# Patient Record
Sex: Female | Born: 1935 | Race: White | Hispanic: No | Smoking: Former smoker
Health system: Southern US, Community
[De-identification: ages and names within clinical notes are randomized; demographics above are authoritative.]

## PROBLEM LIST (undated history)

## (undated) DIAGNOSIS — M25559 Pain in unspecified hip: Secondary | ICD-10-CM

## (undated) DIAGNOSIS — Z86010 Personal history of colonic polyps: Secondary | ICD-10-CM

## (undated) DIAGNOSIS — Z9089 Acquired absence of other organs: Secondary | ICD-10-CM

## (undated) DIAGNOSIS — E876 Hypokalemia: Secondary | ICD-10-CM

## (undated) DIAGNOSIS — J45909 Unspecified asthma, uncomplicated: Secondary | ICD-10-CM

## (undated) DIAGNOSIS — K5730 Diverticulosis of large intestine without perforation or abscess without bleeding: Secondary | ICD-10-CM

## (undated) DIAGNOSIS — Z9889 Other specified postprocedural states: Secondary | ICD-10-CM

## (undated) DIAGNOSIS — J449 Chronic obstructive pulmonary disease, unspecified: Secondary | ICD-10-CM

## (undated) DIAGNOSIS — IMO0002 Reserved for concepts with insufficient information to code with codable children: Secondary | ICD-10-CM

## (undated) DIAGNOSIS — J441 Chronic obstructive pulmonary disease with (acute) exacerbation: Secondary | ICD-10-CM

## (undated) DIAGNOSIS — Z9079 Acquired absence of other genital organ(s): Secondary | ICD-10-CM

## (undated) DIAGNOSIS — J309 Allergic rhinitis, unspecified: Secondary | ICD-10-CM

## (undated) DIAGNOSIS — I10 Essential (primary) hypertension: Secondary | ICD-10-CM

## (undated) DIAGNOSIS — E785 Hyperlipidemia, unspecified: Secondary | ICD-10-CM

## (undated) HISTORY — PX: APPENDECTOMY: SHX54

## (undated) HISTORY — DX: Hyperlipidemia, unspecified: E78.5

## (undated) HISTORY — DX: Personal history of colonic polyps: Z86.010

## (undated) HISTORY — DX: Hypokalemia: E87.6

## (undated) HISTORY — DX: Reserved for concepts with insufficient information to code with codable children: IMO0002

## (undated) HISTORY — PX: DILATION AND CURETTAGE OF UTERUS: SHX78

## (undated) HISTORY — DX: Diverticulosis of large intestine without perforation or abscess without bleeding: K57.30

## (undated) HISTORY — PX: LUMBAR LAMINECTOMY: SHX95

## (undated) HISTORY — PX: TONSILLECTOMY: SHX5217

## (undated) HISTORY — DX: Chronic obstructive pulmonary disease with (acute) exacerbation: J44.1

## (undated) HISTORY — PX: LIPOMA EXCISION: SHX5283

## (undated) HISTORY — DX: Acquired absence of other organs: Z90.89

## (undated) HISTORY — DX: Pain in unspecified hip: M25.559

## (undated) HISTORY — DX: Chronic obstructive pulmonary disease, unspecified: J44.9

## (undated) HISTORY — DX: Other specified postprocedural states: Z98.89

## (undated) HISTORY — DX: Essential (primary) hypertension: I10

## (undated) HISTORY — PX: ABDOMINAL HYSTERECTOMY: SHX81

## (undated) HISTORY — DX: Unspecified asthma, uncomplicated: J45.909

## (undated) HISTORY — DX: Allergic rhinitis, unspecified: J30.9

## (undated) HISTORY — DX: Acquired absence of other genital organ(s): Z90.79

---

## 2005-01-18 ENCOUNTER — Ambulatory Visit: Payer: Self-pay | Admitting: Internal Medicine

## 2005-01-25 ENCOUNTER — Ambulatory Visit: Payer: Self-pay | Admitting: Internal Medicine

## 2005-07-18 ENCOUNTER — Ambulatory Visit: Payer: Self-pay | Admitting: Internal Medicine

## 2006-03-23 ENCOUNTER — Ambulatory Visit: Payer: Self-pay | Admitting: Internal Medicine

## 2006-03-23 LAB — CONVERTED CEMR LAB
ALT: 21 units/L (ref 0–40)
Albumin: 4.1 g/dL (ref 3.5–5.2)
Alkaline Phosphatase: 70 units/L (ref 39–117)
Basophils Absolute: 0.1 10*3/uL (ref 0.0–0.1)
Basophils Relative: 1 % (ref 0.0–1.0)
Cholesterol: 194 mg/dL (ref 0–200)
GFR calc non Af Amer: 51 mL/min
Glomerular Filtration Rate, Af Am: 62 mL/min/{1.73_m2}
Glucose, Bld: 109 mg/dL — ABNORMAL HIGH (ref 70–99)
Monocytes Absolute: 0.7 10*3/uL (ref 0.2–0.7)
Platelets: 282 10*3/uL (ref 150–400)
Potassium: 2.8 meq/L — ABNORMAL LOW (ref 3.5–5.1)
RBC: 4.72 M/uL (ref 3.87–5.11)
Sodium: 142 meq/L (ref 135–145)
Specific Gravity, Urine: 1.02 (ref 1.000–1.03)
Total Protein, Urine: NEGATIVE mg/dL
Total Protein: 7.6 g/dL (ref 6.0–8.3)
VLDL: 87 mg/dL — ABNORMAL HIGH (ref 0–40)
WBC: 9.3 10*3/uL (ref 4.5–10.5)
pH: 6 (ref 5.0–8.0)

## 2006-03-23 LAB — HM MAMMOGRAPHY: HM Mammogram: NORMAL

## 2006-03-27 ENCOUNTER — Encounter: Admission: RE | Admit: 2006-03-27 | Discharge: 2006-03-27 | Payer: Self-pay | Admitting: Internal Medicine

## 2006-03-27 ENCOUNTER — Ambulatory Visit: Payer: Self-pay | Admitting: Internal Medicine

## 2006-06-12 ENCOUNTER — Inpatient Hospital Stay (HOSPITAL_COMMUNITY): Admission: RE | Admit: 2006-06-12 | Discharge: 2006-06-12 | Payer: Self-pay | Admitting: Neurosurgery

## 2006-08-12 ENCOUNTER — Emergency Department (HOSPITAL_COMMUNITY): Admission: EM | Admit: 2006-08-12 | Discharge: 2006-08-12 | Payer: Self-pay | Admitting: Family Medicine

## 2006-08-29 ENCOUNTER — Ambulatory Visit: Payer: Self-pay | Admitting: Internal Medicine

## 2006-09-12 ENCOUNTER — Ambulatory Visit: Payer: Self-pay | Admitting: Internal Medicine

## 2006-09-29 ENCOUNTER — Ambulatory Visit: Payer: Self-pay | Admitting: Internal Medicine

## 2007-03-30 ENCOUNTER — Encounter: Payer: Self-pay | Admitting: Endocrinology

## 2007-04-30 ENCOUNTER — Telehealth: Payer: Self-pay | Admitting: Internal Medicine

## 2007-05-29 ENCOUNTER — Encounter: Payer: Self-pay | Admitting: *Deleted

## 2007-05-29 DIAGNOSIS — J309 Allergic rhinitis, unspecified: Secondary | ICD-10-CM

## 2007-05-29 DIAGNOSIS — IMO0002 Reserved for concepts with insufficient information to code with codable children: Secondary | ICD-10-CM

## 2007-05-29 DIAGNOSIS — E785 Hyperlipidemia, unspecified: Secondary | ICD-10-CM

## 2007-05-29 DIAGNOSIS — J45909 Unspecified asthma, uncomplicated: Secondary | ICD-10-CM

## 2007-05-29 DIAGNOSIS — Z9889 Other specified postprocedural states: Secondary | ICD-10-CM

## 2007-05-29 DIAGNOSIS — Z9079 Acquired absence of other genital organ(s): Secondary | ICD-10-CM | POA: Insufficient documentation

## 2007-05-29 DIAGNOSIS — Z9089 Acquired absence of other organs: Secondary | ICD-10-CM

## 2007-05-29 DIAGNOSIS — I10 Essential (primary) hypertension: Secondary | ICD-10-CM | POA: Insufficient documentation

## 2007-05-29 HISTORY — DX: Other specified postprocedural states: Z98.89

## 2007-05-29 HISTORY — DX: Acquired absence of other genital organ(s): Z90.79

## 2007-05-29 HISTORY — DX: Unspecified asthma, uncomplicated: J45.909

## 2007-05-29 HISTORY — DX: Allergic rhinitis, unspecified: J30.9

## 2007-05-29 HISTORY — DX: Hyperlipidemia, unspecified: E78.5

## 2007-05-29 HISTORY — DX: Reserved for concepts with insufficient information to code with codable children: IMO0002

## 2007-05-29 HISTORY — DX: Essential (primary) hypertension: I10

## 2007-05-29 HISTORY — DX: Acquired absence of other organs: Z90.89

## 2007-05-30 ENCOUNTER — Ambulatory Visit: Payer: Self-pay | Admitting: Internal Medicine

## 2007-05-30 LAB — CONVERTED CEMR LAB
AST: 26 units/L (ref 0–37)
Alkaline Phosphatase: 79 units/L (ref 39–117)
BUN: 21 mg/dL (ref 6–23)
CO2: 33 meq/L — ABNORMAL HIGH (ref 19–32)
Chloride: 100 meq/L (ref 96–112)
Cholesterol: 208 mg/dL (ref 0–200)
Creatinine, Ser: 1 mg/dL (ref 0.4–1.2)
HDL: 39.2 mg/dL (ref >39.0)
Potassium: 2.8 meq/L — ABNORMAL LOW (ref 3.5–5.1)
Total Bilirubin: 1.3 mg/dL — ABNORMAL HIGH (ref 0.3–1.2)
Total Protein: 7.5 g/dL (ref 6.0–8.3)

## 2007-05-31 ENCOUNTER — Encounter: Payer: Self-pay | Admitting: Internal Medicine

## 2007-05-31 ENCOUNTER — Telehealth: Payer: Self-pay | Admitting: Internal Medicine

## 2007-06-05 ENCOUNTER — Ambulatory Visit: Payer: Self-pay | Admitting: Internal Medicine

## 2007-06-05 LAB — CONVERTED CEMR LAB
BUN: 23 mg/dL (ref 6–23)
CO2: 32 meq/L (ref 19–32)
GFR calc Af Amer: 62 mL/min
Glucose, Bld: 148 mg/dL — ABNORMAL HIGH (ref 70–99)
Potassium: 3.3 meq/L — ABNORMAL LOW (ref 3.5–5.1)
Sodium: 140 meq/L (ref 135–145)

## 2007-06-06 ENCOUNTER — Encounter: Payer: Self-pay | Admitting: Internal Medicine

## 2007-06-12 ENCOUNTER — Ambulatory Visit: Payer: Self-pay | Admitting: Internal Medicine

## 2007-06-12 DIAGNOSIS — E876 Hypokalemia: Secondary | ICD-10-CM

## 2007-06-12 HISTORY — DX: Hypokalemia: E87.6

## 2007-06-12 LAB — CONVERTED CEMR LAB
CO2: 32 meq/L (ref 19–32)
Calcium: 9.9 mg/dL (ref 8.4–10.5)
GFR calc Af Amer: 62 mL/min
GFR calc non Af Amer: 51 mL/min
Glucose, Bld: 148 mg/dL — ABNORMAL HIGH (ref 70–99)
Potassium: 3.3 meq/L — ABNORMAL LOW (ref 3.5–5.1)
Sodium: 140 meq/L (ref 135–145)

## 2007-06-13 ENCOUNTER — Encounter: Payer: Self-pay | Admitting: Internal Medicine

## 2007-11-21 ENCOUNTER — Ambulatory Visit: Payer: Self-pay | Admitting: Internal Medicine

## 2007-11-21 DIAGNOSIS — Z860100 Personal history of colon polyps, unspecified: Secondary | ICD-10-CM

## 2007-11-21 DIAGNOSIS — J449 Chronic obstructive pulmonary disease, unspecified: Secondary | ICD-10-CM

## 2007-11-21 DIAGNOSIS — J4489 Other specified chronic obstructive pulmonary disease: Secondary | ICD-10-CM

## 2007-11-21 DIAGNOSIS — K5730 Diverticulosis of large intestine without perforation or abscess without bleeding: Secondary | ICD-10-CM | POA: Insufficient documentation

## 2007-11-21 DIAGNOSIS — J441 Chronic obstructive pulmonary disease with (acute) exacerbation: Secondary | ICD-10-CM | POA: Insufficient documentation

## 2007-11-21 HISTORY — DX: Other specified chronic obstructive pulmonary disease: J44.89

## 2007-11-21 HISTORY — DX: Diverticulosis of large intestine without perforation or abscess without bleeding: K57.30

## 2007-11-21 HISTORY — DX: Personal history of colonic polyps: Z86.010

## 2007-11-21 HISTORY — DX: Chronic obstructive pulmonary disease, unspecified: J44.9

## 2007-11-21 HISTORY — DX: Chronic obstructive pulmonary disease with (acute) exacerbation: J44.1

## 2007-11-21 HISTORY — DX: Personal history of colon polyps, unspecified: Z86.0100

## 2008-05-30 ENCOUNTER — Ambulatory Visit: Payer: Self-pay | Admitting: Internal Medicine

## 2008-05-30 LAB — CONVERTED CEMR LAB
ALT: 13 units/L (ref 0–35)
AST: 24 units/L (ref 0–37)
Albumin: 3.9 g/dL (ref 3.5–5.2)
BUN: 27 mg/dL — ABNORMAL HIGH (ref 6–23)
Basophils Absolute: 0 10*3/uL (ref 0.0–0.1)
Basophils Relative: 0.6 % (ref 0.0–3.0)
CO2: 32 meq/L (ref 19–32)
Calcium: 9.7 mg/dL (ref 8.4–10.5)
Chloride: 101 meq/L (ref 96–112)
Creatinine, Ser: 1.1 mg/dL (ref 0.4–1.2)
Direct LDL: 115.9 mg/dL
Eosinophils Absolute: 0.3 10*3/uL (ref 0.0–0.7)
Eosinophils Relative: 3.9 % (ref 0.0–5.0)
GFR calc non Af Amer: 51 mL/min
Hemoglobin: 14.1 g/dL (ref 12.0–15.0)
MCHC: 35.2 g/dL (ref 30.0–36.0)
MCV: 89.7 fL (ref 78.0–100.0)
Neutro Abs: 3.5 10*3/uL (ref 1.4–7.7)
RBC: 4.47 M/uL (ref 3.87–5.11)
TSH: 2.77 microintl units/mL (ref 0.35–5.50)
Total Bilirubin: 0.9 mg/dL (ref 0.3–1.2)
WBC: 7 10*3/uL (ref 4.5–10.5)

## 2009-06-04 ENCOUNTER — Ambulatory Visit: Payer: Self-pay | Admitting: Internal Medicine

## 2009-06-04 LAB — CONVERTED CEMR LAB
ALT: 17 units/L (ref 0–35)
AST: 30 units/L (ref 0–37)
Albumin: 4.3 g/dL (ref 3.5–5.2)
Alkaline Phosphatase: 67 units/L (ref 39–117)
BUN: 24 mg/dL — ABNORMAL HIGH (ref 6–23)
Basophils Absolute: 0.1 10*3/uL (ref 0.0–0.1)
Chloride: 97 meq/L (ref 96–112)
Cholesterol: 237 mg/dL — ABNORMAL HIGH (ref 0–200)
Creatinine, Ser: 1 mg/dL (ref 0.4–1.2)
Direct LDL: 130.3 mg/dL
Eosinophils Relative: 7.4 % — ABNORMAL HIGH (ref 0.0–5.0)
GFR calc non Af Amer: 56.66 mL/min (ref >60)
Lymphocytes Relative: 37.7 % (ref 12.0–46.0)
Lymphs Abs: 4.3 10*3/uL — ABNORMAL HIGH (ref 0.7–4.0)
Monocytes Relative: 9.3 % (ref 3.0–12.0)
Neutrophils Relative %: 45 % (ref 43.0–77.0)
Platelets: 316 10*3/uL (ref 150.0–400.0)
RDW: 12.9 % (ref 11.5–14.6)
TSH: 4.23 microintl units/mL (ref 0.35–5.50)
Total Protein: 8.3 g/dL (ref 6.0–8.3)
Triglycerides: 563 mg/dL — ABNORMAL HIGH (ref 0.0–149.0)
WBC: 11.5 10*3/uL — ABNORMAL HIGH (ref 4.5–10.5)

## 2010-06-01 ENCOUNTER — Encounter: Payer: Self-pay | Admitting: Internal Medicine

## 2010-06-01 ENCOUNTER — Ambulatory Visit: Payer: Self-pay | Admitting: Internal Medicine

## 2010-06-01 DIAGNOSIS — M25559 Pain in unspecified hip: Secondary | ICD-10-CM

## 2010-06-01 HISTORY — DX: Pain in unspecified hip: M25.559

## 2010-06-01 LAB — CONVERTED CEMR LAB
ALT: 17 units/L (ref 0–35)
AST: 26 units/L (ref 0–37)
Albumin: 4.2 g/dL (ref 3.5–5.2)
Basophils Absolute: 0.1 10*3/uL (ref 0.0–0.1)
Bilirubin, Direct: 0.1 mg/dL (ref 0.0–0.3)
Eosinophils Absolute: 0.5 10*3/uL (ref 0.0–0.7)
GFR calc non Af Amer: 52.84 mL/min — ABNORMAL LOW (ref >60.00)
Glucose, Bld: 90 mg/dL (ref 70–99)
Lymphocytes Relative: 32.2 % (ref 12.0–46.0)
MCHC: 34.8 g/dL (ref 30.0–36.0)
Neutrophils Relative %: 55 % (ref 43.0–77.0)
Phosphorus: 3.4 mg/dL (ref 2.3–4.6)
Potassium: 3.1 meq/L — ABNORMAL LOW (ref 3.5–5.1)
RBC: 4.67 M/uL (ref 3.87–5.11)
RDW: 13.8 % (ref 11.5–14.6)
Sodium: 141 meq/L (ref 135–145)
Total Bilirubin: 1.1 mg/dL (ref 0.3–1.2)
Total Protein: 7.3 g/dL (ref 6.0–8.3)
Triglycerides: 426 mg/dL — ABNORMAL HIGH (ref 0.0–149.0)

## 2010-06-08 ENCOUNTER — Ambulatory Visit: Payer: Self-pay | Admitting: Internal Medicine

## 2010-06-08 LAB — PULMONARY FUNCTION TEST

## 2010-06-13 HISTORY — PX: PARTIAL HIP ARTHROPLASTY: SHX733

## 2010-07-02 ENCOUNTER — Encounter: Payer: Self-pay | Admitting: Internal Medicine

## 2010-07-06 ENCOUNTER — Telehealth: Payer: Self-pay | Admitting: Internal Medicine

## 2010-07-07 ENCOUNTER — Encounter: Payer: Self-pay | Admitting: Internal Medicine

## 2010-07-15 NOTE — Assessment & Plan Note (Signed)
Summary: YEARLY FU /MEDICARE / LABS LATER/NWS   Vital Signs:  Patient profile:   77 year old female Height:      67 inches Weight:      157 pounds BMI:     24.68 O2 Sat:      95 % on Room air Temp:     98.4 degrees F oral Pulse rate:   100 / minute BP sitting:   152 / 88  (left arm) Cuff size:   large  Vitals Entered By: Ami Bullins CMA (June 01, 2010 1:21 PM)  O2 Flow:  Room air CC: yearly   Primary Care Provider:  Norins  CC:  yearly.  History of Present Illness: Patient presents for medicare wellness exam and for medical follow-up.   She c/o pain in the left hip. She has increased pain with slr, she reports it hurts to bear weight and that she is using a cane. She thinks that she has had relief with aleve. No prior x-rays of the hip. No recollection of injury. It has been a problem for 9 months +. She is throwing in the towel.  She is having recurrent trouble iwth soreness in the left nares. she has been treated successfully with mupirocin in the past.   She is independent in your ADLs. No evidence of depression. She is cognitively intact: pays her bills, writes her checks and manages all of her own affairs. She has not falled but she has high risk due to hip pain and occasional loss of balance.   Preventive Screening-Counseling & Management  Alcohol-Tobacco     Alcohol drinks/day: 0     Smoking Status: quit     Year Quit: 1960s     Pack years: 20  Caffeine-Diet-Exercise     Caffeine use/day: 1 cup daily     Diet Comments: general diet with no restriction     Does Patient Exercise: no     Exercise Counseling: to improve exercise regimen  Hep-HIV-STD-Contraception     Hepatitis Risk: no risk noted     HIV Risk: no risk noted     STD Risk: no risk noted     Dental Visit-last 6 months no     SBE monthly: no     Sun Exposure-Excessive: no  Safety-Violence-Falls     Seat Belt Use: yes     Helmet Use: n/a     Firearms in the Home: no firearms in the home    Smoke Detectors: yes     Violence in the Home: no risk noted     Sexual Abuse: no     Fall Risk: slight fall risk      Drug Use:  never.        Blood Transfusions:  yes and prior to 1987.    Current Medications (verified): 1)  Amlodipine Besylate 10 Mg  Tabs (Amlodipine Besylate) .... Take One Tablet Once Daily 2)  Triamterene-Hctz 75-50 Mg  Tabs (Triamterene-Hctz) .... Take One Tablet Once Daily 3)  Multivitamins   Tabs (Multiple Vitamin) .... Take One Tablet Once Daily 4)  Ventolin Hfa 108 (90 Base) Mcg/act  Aers (Albuterol Sulfate) .... 2 Puffs Q 4 Hrs As Needed 5)  Mupirocin 2 % Oint (Mupirocin) .... Apply Two Times A Day To Nostril  Allergies (verified): 1)  ! Codeine  Past History:  Past Medical History: Last updated: 11/21/2007 Hx of ALLERGIC RHINITIS (ICD-477.9) RADICULOPATHY (ICD-729.2)-left leg numbness REACTIVE AIRWAY DISEASE (ICD-493.90) HYPERLIPIDEMIA (ICD-272.4)-predominantly  triglycerides HYPERTENSION (ICD-401.9) COPD  Colonic polyps, hx of - hyperplastic 8/04 Diverticulosis, colon  Past Surgical History: Last updated: 05/29/2007 LAMINECTOMY, LUMBAR, HX OF (ICD-V45.89) LIPOMA EXCISED (ICD-214.9) DILATION AND CURETTAGE, HX OF (ICD-V45.89) APPENDECTOMY, HX OF (ICD-V45.79) HYSTERECTOMY, HX OF (ICD-V45.77) TONSILLECTOMY, HX OF (ICD-V45.79)    Family History: Last updated: 12/18/07 father- died 56; CHF mother - died 5; arthritis Neg- colon, breast cancer duaghter with uterine cancer  Social History: Last updated: 05/30/2008 married  1962-widowed 2001 2 daughter-'54, '65; 1 son '62; 3 grandsons; eldest daughter now with metastatic gynecologic cancer (Dec '09) retired : Audiological scientist work. Lives- I-ADLs End-of-Life issues: no heroic measures, i.e. feeding tubes, mechanical ventilation.  Social History: Caffeine use/day:  1 cup daily Sun Exposure-Excessive:  no Seat Belt Use:  yes Fall Risk:  slight fall risk Blood Transfusions:  yes, prior to  1987 Hepatitis Risk:  no risk noted HIV Risk:  no risk noted STD Risk:  no risk noted Dental Care w/in 6 mos.:  no Drug Use:  never  Review of Systems       The patient complains of incontinence.  The patient denies anorexia, fever, weight loss, weight gain, vision loss, decreased hearing, hoarseness, syncope, dyspnea on exertion, prolonged cough, hemoptysis, abdominal pain, severe indigestion/heartburn, muscle weakness, suspicious skin lesions, abnormal bleeding, enlarged lymph nodes, and breast masses.    Physical Exam  General:  Elderly white female who is emotionally upset during the exam but in no distress Head:  normocephalic, atraumatic, and no abnormalities observed.   Eyes:  vision grossly intact, pupils equal, pupils round, corneas and lenses clear, and no injection.   Ears:  External ear exam shows no significant lesions or deformities.  Otoscopic examination reveals clear canals, tympanic membranes are intact bilaterally without bulging, retraction, inflammation or discharge. Hearing is grossly normal bilaterally. Nose:  no external deformity and no external erythema.   Mouth:  complex partial dentures upper and lower. No buccal lesions, throat clear. Neck:  supple, full ROM, no masses, no thyromegaly, and no carotid bruits.   Chest Wall:  no deformities and no tenderness.   Breasts:  No mass, nodules, thickening, tenderness, bulging, retraction, inflamation, nipple discharge or skin changes noted.   Lungs:  Normal respiratory effort, chest expands symmetrically. Lungs are clear to auscultation, no crackles or wheezes. Heart:  normal rate, regular rhythm, no murmur, no JVD, and no HJR.   Abdomen:  soft, non-tender, normal bowel sounds, no distention, no guarding, and no hepatomegaly.   Genitalia:  deferred Msk:  UE normal, major joints - shoulder, elbow normal. Hands without deformity.  Hip: right - no tendernesss with internal or external rotation, no tenderness to AP  pressure or pressure against greater trochanter        Left - very stiff. Minimal ROM with internal or external rotation and very tender. Tender to AP pressure and pressure against greater trochanter.  Knees, ankles, feet normal Pulses:  2+ radial and DP pulses Extremities:  No clubbing, cyanosis, edema, or deformity noted with normal full range of motion of all joints.   Neurologic:  alert & oriented X3, cranial nerves II-XII intact, strength normal in all extremities, gait normal, and DTRs symmetrical and normal.   Skin:  turgor normal, color normal, no ecchymoses, and no ulcerations.   Cervical Nodes:  no anterior cervical adenopathy and no posterior cervical adenopathy.   Axillary Nodes:  no R axillary adenopathy and no L axillary adenopathy.   Psych:  Oriented X3, normally interactive, good eye contact, tearful, and moderately  anxious.     Impression & Recommendations:  Problem # 1:  HIP PAIN, LEFT, CHRONIC (ICD-719.45) Patient with severe pain and limitation in mobility. On exam - loss of ROM left hip and tenderness.  Plan - x-ray of hips with recommendations to follow.  Orders: T-Bilateral Hip w/Pelvis, min 2 views (73520TC) Orthopedic Surgeon Referral (Ortho Surgeon)  Addendum - severe DJD left hip - bone on bone, possible AVN femoral head.  Plan - refer to Dr. Raylene Everts  Problem # 2:  COPD (ICD-496) Doing well most of the time, using bronchodilator less than once a week. She will continue to use albuterol as needed.  Plan - will scedule PFT's for routine follow-up of her COPD  Her updated medication list for this problem includes:    Ventolin Hfa 108 (90 Base) Mcg/act Aers (Albuterol sulfate) .Marland Kitchen... 2 puffs q 4 hrs as needed  Problem # 3:  HYPERLIPIDEMIA (ICD-272.4) Due for lab with recommendations to follow.   Orders: TLB-Lipid Panel (80061-LIPID) TLB-Hepatic/Liver Function Pnl (80076-HEPATIC)  Addendum - LDL 129 - at goal  Plan - continue life-style  management  Problem # 4:  HYPERTENSION (ICD-401.9)  Her updated medication list for this problem includes:    Amlodipine Besylate 10 Mg Tabs (Amlodipine besylate) .Marland Kitchen... Take one tablet once daily    Triamterene-hctz 75-50 Mg Tabs (Triamterene-hctz) .Marland Kitchen... Take one tablet once daily  Orders: TLB-Renal Function Panel (80069-RENAL)  BP today: 152/88 Prior BP: 142/70 (06/04/2009)  Prior 10 Yr Risk Heart Disease: 13 % (05/30/2008)  Suboptimal control which is most likely due to marked pain. Will make no changes in medications at this time.   Problem # 5:  Preventive Health Care (ICD-V70.0)  Interval history significant for severe hip pain and loss of mobility. Her medical interval history is stable. Lab results are within normal limits. She is overdue for mammography and this will be scheduled for her. She is current for colorectal cancer screening with last study  Aug '04. Immunizations: due for tetnus; Pneumovax Dec '08; due for shingles. 12 Lewad EKG without signs of ischemia.  In summary - a very nice woman who is upset and worried about her hip. We discussed the options and she is advised to see ortho and to consider THR. She is medically stable for surgery and anesthesia if needed.   We did discuss end of life issues in the presence of her daughter. She is undecided at this time about CPR and mechanical ventilation. She did indicate she would not want to be kept aloive with heroic measures if there was no meaningful quality of life.   She will be seen as needed over the next several weeks/months depending on Dr. Nilsa Nutting recommendations.   Orders: Medicare -1st Annual Wellness Visit [phone])  Complete Medication List: 1)  Amlodipine Besylate 10 Mg Tabs (Amlodipine besylate) .... Take one tablet once daily 2)  Triamterene-hctz 75-50 Mg Tabs (Triamterene-hctz) .... Take one tablet once daily 3)  Multivitamins Tabs (Multiple vitamin) .... Take one tablet once daily 4)  Ventolin Hfa 108  (90 Base) Mcg/act Aers (Albuterol sulfate) .... 2 puffs q 4 hrs as needed 5)  Mupirocin 2 % Oint (Mupirocin) .... Apply two times a day to nostril  Other Orders: TLB-CBC Platelet - w/Differential (85025-CBCD) Pulmonary Referral (Pulmonary) Radiology Referral (Radiology)  Patient: [name] Note: All result statuses are Final unless otherwise noted.  Tests: (1) Renal Function Panel (RENAL)   Sodium  141 mEq/L                   135-145   Potassium            [L]  3.1 mEq/L                   3.5-5.1   Chloride                  100 mEq/L                   96-112   Carbon Dioxide       [H]  33 mEq/L                    19-32   Calcium                   10.5 mg/dL                  0.4-54.0   Albumin                   4.2 g/dL                    9.8-1.1   BUN                       21 mg/dL                    9-14   Creatinine                1.1 mg/dL                   7.8-2.9   Glucose                   90 mg/dL                    56-21   Phosphorus                3.4 mg/dL                   3.0-8.6   GFR                  [L]  52.84 mL/min                >60.00  Tests: (2) Lipid Panel (LIPID)   Cholesterol          [H]  238 mg/dL                   5-784     ATP III Classification            Desirable:  < 200 mg/dL                    Borderline High:  200 - 239 mg/dL               High:  > = 240 mg/dL   Triglycerides        [H]  426.0 mg/dL                 6.9-629.5     Normal:  <150 mg/dL     Borderline High:  284 - 199 mg/dL     Triglyceride is over 400; calculations on Lipids are invalid.  HDL                       04.54 mg/dL                 >09.81   VLDL Cholesterol     [H]  85.2 mg/dL                  1.9-14.7  CHO/HDL Ratio:  CHD Risk                             5                    Men          Women     1/2 Average Risk     3.4          3.3     Average Risk          5.0          4.4     2X Average Risk          9.6          7.1     3X Average Risk           15.0          11.0                           Tests: (3) Hepatic/Liver Function Panel (HEPATIC)   Total Bilirubin           1.1 mg/dL                   8.2-9.5   Direct Bilirubin          0.1 mg/dL                   6.2-1.3   Alkaline Phosphatase      78 U/L                      39-117   AST                       26 U/L                      0-37   ALT                       17 U/L                      0-35   Total Protein             7.3 g/dL                    0.8-6.5   Albumin                   4.2 g/dL                    7.8-4.6  Tests: (4) CBC Platelet w/Diff (CBCD)   White Cell Count     [H]  10.9 K/uL                   4.5-10.5   Red Cell Count  4.67 Mil/uL                 3.87-5.11   Hemoglobin                14.8 g/dL                   16.1-09.6   Hematocrit                42.6 %                      36.0-46.0   MCV                       91.2 fl                     78.0-100.0   MCHC                      34.8 g/dL                   04.5-40.9   RDW                       13.8 %                      11.5-14.6   Platelet Count            332.0 K/uL                  150.0-400.0   Neutrophil %              55.0 %                      43.0-77.0   Lymphocyte %              32.2 %                      12.0-46.0   Monocyte %                7.1 %                       3.0-12.0   Eosinophils%              4.8 %                       0.0-5.0   Basophils %               0.9 %                       0.0-3.0   Neutrophill Absolute      6.0 K/uL                    1.4-7.7   Lymphocyte Absolute       3.5 K/uL                    0.7-4.0   Monocyte Absolute         0.8 K/uL                    0.1-1.0  Eosinophils, Absolute  0.5 K/uL                    0.0-0.7   Basophils Absolute        0.1 K/uL                    0.0-0.1  Tests: (5) Cholesterol LDL - Direct (DIRLDL)  Cholesterol LDL - Direct                             129.4 mg/dL     Optimal:  <161  mg/dL     Near or Above Optimal:  100-129 mg/dL     Borderline High:  096-045 mg/dL     High:  409-811 mg/dL     Very High:  >914 mg/dL  DG HIP BILATERAL W/PELVIS - [id number]   Clinical Data: Chronic left hip pain   BILATERAL HIP WITH PELVIS - 4+ VIEW   Comparison: None   Findings: Advanced degenerative change in the left hip joint with complete loss of cartilage, subchondral sclerosis and cystic change and osteophytes.  Possible underlying AVN of the left femoral head.   The right hip joint appears normal.  Negative for fracture.  SI joints are normal.  Lumbar scoliosis.   IMPRESSION: Advanced degenerative change in the left hip.  There is irregularity of the left femoral head raising the possibility of chronic AVN.   Read By:  Camelia Phenes,  Judie Petit.D.Prescriptions: VENTOLIN HFA 108 (90 BASE) MCG/ACT  AERS (ALBUTEROL SULFATE) 2 puffs q 4 hrs as needed  #1 x 3   Entered and Authorized by:   Jacques Navy MD   Signed by:   Jacques Navy MD on 06/01/2010   Method used:   Electronically to        [facility]* (retail)       [address] [place], [place]  [address]       Ph: [phone] or [phone]       Fax: [phone]   RxID:   [id number] TRIAMTERENE-HCTZ 75-50 MG  TABS (TRIAMTERENE-HCTZ) Take one tablet once daily  #30 x 12   Entered and Authorized by:   Jacques Navy MD   Signed by:   Jacques Navy MD on 06/01/2010   Method used:   Electronically to        [facility]* (retail)       [address] [place], [place]  [address]       Ph: [phone] or [phone]       Fax: [phone]   RxID:   [id number] AMLODIPINE BESYLATE 10 MG  TABS (AMLODIPINE BESYLATE) Take one tablet once daily  #30 x 12   Entered and Authorized by:   Jacques Navy MD   Signed by:   Jacques Navy MD on 06/01/2010   Method used:    Electronically to        [facility]* (retail)       [address] [place], [place]  [address]       Ph: [phone] or [phone]  Fax: [phone]   RxID:   [id number]    Orders Added: 1)  TLB-Renal Function Panel [80069-RENAL] 2)  TLB-Lipid Panel [80061-LIPID] 3)  TLB-Hepatic/Liver Function Pnl [80076-HEPATIC] 4)  TLB-CBC Platelet - w/Differential [85025-CBCD] 5)  Pulmonary Referral [Pulmonary] 6)  Radiology Referral [Radiology] 7)  T-Bilateral Hip w/Pelvis, min 2 views [73520TC] 8)  Orthopedic Surgeon Referral [Ortho Surgeon] 9)  Medicare -1st Annual Wellness Visit [G0438] 10)  Est. Patient Level IV [44034]

## 2010-07-15 NOTE — Miscellaneous (Signed)
Summary: Orders Update pft charges  Clinical Lists Changes  Orders: Added new Service order of Carbon Monoxide diffusing w/capacity (94720) - Signed Added new Service order of Lung Volumes (94240) - Signed Added new Service order of Spirometry (Pre & Post) (94060) - Signed 

## 2010-07-15 NOTE — Progress Notes (Signed)
  Phone Note Call from Patient   Caller: Patient Summary of Call: FYI.. Patient called to inform MD that seen has seen ortho and has been scheduled for a  hip replacement (09/07/10).  MD was notified and pt will be given a medical clearence.Alvy Beal Archie CMA  July 06, 2010 2:38 PM

## 2010-07-21 NOTE — Consult Note (Signed)
Summary: [facility]   Imported By: Sherian Rein 07/15/2010 14:58:44  _____________________________________________________________________  External Attachment:    Type:   Image     Comment:   External Document

## 2010-07-21 NOTE — Letter (Signed)
Summary: Pre-Op Clearance/[facility]  Pre-Op Clearance/[facility]   Imported By: Sherian Rein 07/12/2010 11:56:34  _____________________________________________________________________  External Attachment:    Type:   Image     Comment:   External Document

## 2010-08-31 ENCOUNTER — Other Ambulatory Visit: Payer: Self-pay | Admitting: Orthopedic Surgery

## 2010-08-31 ENCOUNTER — Ambulatory Visit (HOSPITAL_COMMUNITY)
Admission: RE | Admit: 2010-08-31 | Discharge: 2010-08-31 | Disposition: A | Discharge status: Home / Self Care | Payer: Medicare Other | Source: Ambulatory Visit | Attending: Orthopedic Surgery | Admitting: Orthopedic Surgery

## 2010-08-31 ENCOUNTER — Other Ambulatory Visit (HOSPITAL_COMMUNITY): Payer: Self-pay | Admitting: Orthopedic Surgery

## 2010-08-31 ENCOUNTER — Encounter (HOSPITAL_COMMUNITY): Payer: Medicare Other

## 2010-08-31 DIAGNOSIS — Z01812 Encounter for preprocedural laboratory examination: Secondary | ICD-10-CM | POA: Insufficient documentation

## 2010-08-31 DIAGNOSIS — Z01818 Encounter for other preprocedural examination: Secondary | ICD-10-CM | POA: Insufficient documentation

## 2010-08-31 DIAGNOSIS — Z01811 Encounter for preprocedural respiratory examination: Secondary | ICD-10-CM

## 2010-08-31 LAB — URINALYSIS, ROUTINE W REFLEX MICROSCOPIC
Hgb urine dipstick: NEGATIVE
Protein, ur: NEGATIVE mg/dL
Urobilinogen, UA: 0.2 mg/dL (ref 0.0–1.0)

## 2010-08-31 LAB — BASIC METABOLIC PANEL
GFR calc non Af Amer: 39 mL/min — ABNORMAL LOW (ref >60)
Glucose, Bld: 97 mg/dL (ref 70–99)
Potassium: 3.8 mEq/L (ref 3.5–5.1)
Sodium: 141 mEq/L (ref 135–145)

## 2010-08-31 LAB — CBC
Platelets: 317 10*3/uL (ref 150–400)
RBC: 4.92 MIL/uL (ref 3.87–5.11)
WBC: 10.2 10*3/uL (ref 4.0–10.5)

## 2010-08-31 LAB — PROTIME-INR
INR: 1 (ref 0.00–1.49)
Prothrombin Time: 13.4 seconds (ref 11.6–15.2)

## 2010-08-31 LAB — DIFFERENTIAL
Lymphocytes Relative: 31 % (ref 12–46)
Lymphs Abs: 3.2 10*3/uL (ref 0.7–4.0)
Neutro Abs: 5.5 10*3/uL (ref 1.7–7.7)
Neutrophils Relative %: 54 % (ref 43–77)

## 2010-08-31 LAB — APTT: aPTT: 29 seconds (ref 24–37)

## 2010-08-31 LAB — URINE MICROSCOPIC-ADD ON

## 2010-08-31 LAB — SURGICAL PCR SCREEN: Staphylococcus aureus: NEGATIVE

## 2010-09-07 ENCOUNTER — Inpatient Hospital Stay (HOSPITAL_COMMUNITY)
Admission: RE | Admit: 2010-09-07 | Discharge: 2010-09-10 | DRG: 470 | Disposition: A | Discharge status: Skilled Nursing Facility | Payer: Medicare Other | Source: Ambulatory Visit | Attending: Orthopedic Surgery | Admitting: Orthopedic Surgery

## 2010-09-07 ENCOUNTER — Inpatient Hospital Stay (HOSPITAL_COMMUNITY): Payer: Medicare Other

## 2010-09-07 DIAGNOSIS — I10 Essential (primary) hypertension: Secondary | ICD-10-CM | POA: Diagnosis present

## 2010-09-07 DIAGNOSIS — K5730 Diverticulosis of large intestine without perforation or abscess without bleeding: Secondary | ICD-10-CM | POA: Diagnosis present

## 2010-09-07 DIAGNOSIS — M169 Osteoarthritis of hip, unspecified: Principal | ICD-10-CM | POA: Diagnosis present

## 2010-09-07 DIAGNOSIS — M25819 Other specified joint disorders, unspecified shoulder: Secondary | ICD-10-CM | POA: Diagnosis present

## 2010-09-07 DIAGNOSIS — M1610 Unilateral primary osteoarthritis, unspecified hip: Principal | ICD-10-CM | POA: Diagnosis present

## 2010-09-07 DIAGNOSIS — J4489 Other specified chronic obstructive pulmonary disease: Secondary | ICD-10-CM | POA: Diagnosis present

## 2010-09-07 DIAGNOSIS — Z01812 Encounter for preprocedural laboratory examination: Secondary | ICD-10-CM

## 2010-09-07 DIAGNOSIS — I739 Peripheral vascular disease, unspecified: Secondary | ICD-10-CM | POA: Diagnosis present

## 2010-09-07 DIAGNOSIS — Z01818 Encounter for other preprocedural examination: Secondary | ICD-10-CM

## 2010-09-07 DIAGNOSIS — Z79899 Other long term (current) drug therapy: Secondary | ICD-10-CM

## 2010-09-07 DIAGNOSIS — Z7982 Long term (current) use of aspirin: Secondary | ICD-10-CM

## 2010-09-07 DIAGNOSIS — M1990 Unspecified osteoarthritis, unspecified site: Secondary | ICD-10-CM

## 2010-09-07 DIAGNOSIS — J449 Chronic obstructive pulmonary disease, unspecified: Secondary | ICD-10-CM | POA: Diagnosis present

## 2010-09-07 LAB — TYPE AND SCREEN
ABO/RH(D): A POS
Antibody Screen: NEGATIVE

## 2010-09-08 LAB — CBC
MCH: 29.4 pg (ref 26.0–34.0)
MCHC: 32.7 g/dL (ref 30.0–36.0)
MCV: 89.9 fL (ref 78.0–100.0)
Platelets: 199 10*3/uL (ref 150–400)
RDW: 13.7 % (ref 11.5–15.5)
WBC: 8.9 10*3/uL (ref 4.0–10.5)

## 2010-09-08 LAB — BASIC METABOLIC PANEL
BUN: 10 mg/dL (ref 6–23)
Calcium: 7.6 mg/dL — ABNORMAL LOW (ref 8.4–10.5)
Creatinine, Ser: 0.86 mg/dL (ref 0.4–1.2)
GFR calc non Af Amer: >60 mL/min (ref >60)

## 2010-09-09 LAB — URINALYSIS, MICROSCOPIC ONLY
Ketones, ur: NEGATIVE mg/dL
Leukocytes, UA: NEGATIVE
Nitrite: NEGATIVE
Specific Gravity, Urine: 1.015 (ref 1.005–1.030)
Urobilinogen, UA: 0.2 mg/dL (ref 0.0–1.0)
pH: 7 (ref 5.0–8.0)

## 2010-09-09 LAB — CBC
Platelets: 230 10*3/uL (ref 150–400)
RDW: 13.7 % (ref 11.5–15.5)
WBC: 13 10*3/uL — ABNORMAL HIGH (ref 4.0–10.5)

## 2010-09-09 LAB — BASIC METABOLIC PANEL
Calcium: 9.1 mg/dL (ref 8.4–10.5)
GFR calc non Af Amer: 58 mL/min — ABNORMAL LOW (ref >60)
Potassium: 3.9 mEq/L (ref 3.5–5.1)
Sodium: 140 mEq/L (ref 135–145)

## 2010-09-09 NOTE — Op Note (Signed)
[name], [name]             ACCOUNT NO.:  [id number]  MEDICAL RECORD NO.:  [id number]           PATIENT TYPE:  I  LOCATION:  0002                         FACILITY:  [facility]  PHYSICIAN:  Madlyn Frankel. Charlann Boxer, M.D.  DATE OF BIRTH:  28-Jan-1927  DATE OF PROCEDURE:  09/07/2010 DATE OF DISCHARGE:                              OPERATIVE REPORT   PREOPERATIVE DIAGNOSIS:  Left hip osteoarthritis.  POSTOPERATIVE DIAGNOSIS:  Left hip osteoarthritis.  PROCEDURE:  Left total hip replacement through an anterior approach.  COMPONENTS USED:  DePuy Hip System, size 52 Pinnacle shell, single cancellous screw with 36+4 neutral AltrX liner, a size 4 standard Tri- Lock stem with a 36+1.5 Ace sphere metal ball.  SURGEON:  Madlyn Frankel. Charlann Boxer, M.D.  ASSISTANT:  Jaquelyn Bitter. Chabon, P.A.  ANESTHESIA:  General.  SPECIMENS:  None.  COMPLICATIONS:  None.  DRAINS:  One Hemovac.  BLOOD LOSS:  Approximately 350 cc.  INDICATIONS FOR PROCEDURE:  Ms. Horkey is a pleasant 77 year old female who presented to the office for left hip pain.  Radiographs revealed severe end-stage degenerative changes.  After reviewing with her, her current situation and options, she wished to proceed with hip arthroplasty.  We discussed the anterior versus posterior of hips, in addition to standard risk of infection, DVT, component failure, need for revision surgery and dislocation, she wished to proceed to get this taken care of.  Consent was obtained for benefit of pain relief.  PROCEDURE IN DETAIL:  The patient was brought to operative theater. Once adequate anesthesia, preoperative antibiotics, Ancef administered, she was positioned supine on the Hana OSI table.  The belly was shifted laterally from the left side away from the left ASIS.  The left hip was then prepped and draped in sterile fashion after fluoroscopic confirmation of the landmarks.  The time-out was performed identifying the patient, planned  procedure, and the extremity.  An incision was made 2 cm diagonally lateral from the anterior superior iliac spine, extending over to the anterior aspect of the proximal hip. The tensor fascia lata muscle was identified and swept free of soft tissues and a protractor was placed.  The fascia was then incised.  The underlying tensor fascia muscle was swept laterally and retractor was placed superior along the neck of the femur.  Following cauterization of circumflex vessels and removal of pericapsular fat tissue, a second cobra retractor was placed along the inferior neck.  Capsule was identified with a retractor placed over the acetabulum anteriorly.  An L-capsulotomy was then made along the superior neck to the trochanteric fossa down to the intertrochanteric line.  Tack sutures placed and retractors placed into the capsule.  At this point, I tried to pull tractions, she was noted to have severe degenerative changes of the left hip.  Under fluoroscopic imaging, I identified the location and landmarks for a neck osteotomy.  Following neck osteotomy, the femoral head was removed without damage to the tensor fascia muscle.  At this point, an anterior retractor was replaced as well as a posterior retractor slightly splitting the posterior capsule to allow for placement of this.  I then was able to  remove labrum as well as foveal tissue.  I began reaming with a 48 reamer and reamed up to 51 reamer with good bony bed preparation.  She is noted have some acetabular cysts, which were curetted out and then packed with femoral head autograft.  The final 52 cup was then impacted under fluoroscopic imaging, with about 15 degrees of anteversion and 35 degrees of abduction.  The single cancellous screw was placed, a hole eliminator placed and final 36+4 AltrX liner with good secure rim fit.  At this point, the attention was directed to femur.  The femoral hook was placed just distal to vastus  ridge.  Manually, I was able to elevate the femur and then put tension on using the automated hook holder.  I then externally rotated the hip to about 80 degrees, releasing the inferior capsule to place a medial cobra retractor.  At this point with tension on the posterior structures, using a Hohmann retractor, I removed some of the posterior capsular tissues that were just overhanging above the lateral femur.  Lateral retractors were placed in the box and the leg was then dropped down and adducted.  At this point, I had the hip rotate about 100 degrees.  I used a box osteotome and opened proximal femur and then passed by hand the starting broach.  We irrigated the hip out and broached up to a size 3 broach.  I then did a trial reduction with standard neck based off radiographs preoperatively.  At this point, evaluating the position of the hip in relationship with the contralateral hip, I felt #1 that I needed to go up one size on the broach, but that the leg lengths appeared to be confluent at this point. At this point, the hip was reduced, dislocated, the trial components removed, replacement of retractors and position of the hip was carried out.  I broached up to size 4 broach which I had good mediolateral fit and sat maybe a millimeter __________ neck.  I chose the 4-standard stem as the final choice based on this fit.  The 4-standard stem was opened and impacted and sat where the broach had based on this.  I went ahead and re-trialed with 36+1.5 ball and again was happy with range of motion on the table as well as the lengths radiographically.  The hip was dislocated, again the final 36+1.5 Ace sphere ball was then impacted on a clean and dry trunnion and the hip was reduced.  The hip was irrigated throughout the case again at this point.  I reapproximated the anterior capsule to itself using #1 Vicryl.  A medium Hemovac drain was placed deep in the fascia.  The tensor  fascia muscle was then reapproximated over top of this drain using #1 Vicryl running.  The remainder of wound was closed with 2-0 Vicryl and running 4-0 Monocryl.  The hip was cleaned, dried, and dressed sterilely using an Aquacel dressing.  She was then brought to recovery room, extubated in stable condition, tolerating the procedure well.     Madlyn Frankel Charlann Boxer, M.D.     MDO/MEDQ  D:  09/07/2010  T:  09/07/2010  Job:  [id number]  Electronically Signed by Durene Romans M.D. on 09/09/2010 05:32:28 AM

## 2010-09-10 LAB — URINE CULTURE
Culture  Setup Time: 201203291324
Culture: NO GROWTH

## 2010-09-10 NOTE — Discharge Summary (Signed)
NAMEVERDELLA, LAIDLAW             ACCOUNT NO.:  [id number]  MEDICAL RECORD NO.:  [id number]           PATIENT TYPE:  I  LOCATION:  1609                         FACILITY:  [facility]  PHYSICIAN:  Madlyn Frankel. Charlann Boxer, M.D.  DATE OF BIRTH:  1926/07/11  DATE OF ADMISSION:  09/07/2010 DATE OF DISCHARGE:  09/10/2010                        DISCHARGE SUMMARY - REFERRING   ADMITTING DIAGNOSIS:  Left hip osteoarthritis.  DISCHARGE DIAGNOSES: 1. Left hip osteoarthritis, status post left total hip replacement on     September 07, 2010. 2. Peripheral vascular disease. 3. Hypertension. 4. Chronic obstructive pulmonary disease and emphysema. 5. History of diverticulitis. 6. History of urinary incontinence.  ADMITTING HISTORY:  [name] is an 77 year old female who presented to the office for evaluation of left hip arthritis.  Radiographs revealed end-stage degenerative changes with significant functional quality of life based on this.  She wished to pursue more definitive measures. Risks and benefits of the hip replacement were discussed.  She wished to proceed in this fashion.  Consent was obtained through our discussion in the office.  HOSPITAL COURSE:  The patient admitted for same-day surgery on September 07, 2010.  She underwent a left total hip replacement through an anterior approach without complication.  Please see dictated operative note for full details of the procedure.  Postoperatively, she was transferred to the recovery room for routine stay and then to the orthopedic ward where she remained her entire hospital stay.  Postoperative day #1, she had a Foley removed and Hemovac drain removed.  She was seen and evaluated by Physical Therapy, was weightbearing as tolerated without restrictions.  On postoperative day #1, she was noted to have a hematocrit of 30.3, and on postoperative day #2, hematocrit of 33.6.  Her electrolytes remained stable, particularly creatinine no bump.  Urinalysis  was done based on a bump in her white blood count and was negative for any concern for urinary tract infection.  She was seen and evaluated by Physical Therapy and progressed nicely and was ready for planned discharge to nursing facility for social situations on September 10, 2010.  DISCHARGE INSTRUCTIONS:  The patient will be discharged to nursing facility for therapy and recovery.  She will be seen and evaluated by Physical Therapy and will be weightbearing as tolerated without restrictions.  She will be on a regular diet.  She will return to see Dr. Durene Romans at [facility] at office number [phone] in 2 weeks' time.  Any questions orthopedically can be addressed particular regarding wound.  Her current wound has an Aquacel dressing that will remain in place to September 16, 2010, at which point it will be removed and dry gauze applied as needed daily.  DISCHARGE MEDICATIONS: 1. Colace 100 mg p.o. b.i.d. as needed for constipation while on pain     medicine. 2. MiraLax 17 g p.o. daily as needed for constipation while pain     medicine. 3. Iron 325 mg p.o. 2-3 times a day as tolerated for 2 weeks. 4. Norco 5/325 one to two tablets every 4-6 hours as needed for pain. 5. Robaxin 500 mg every 6 hours as needed for muscle  spasm and pain. 6. Aspirin 325 mg p.o. b.i.d. for 30 days. 7. Amlodipine 10 mg p.o. q.a.m. 8. Multivitamins p.o. daily. 9. Triamterene/hydrochlorothiazide 75/50 p.o. q.a.m. 10.Albuterol inhaler 2 puffs every 4 hours as needed.     Madlyn Frankel Charlann Boxer, M.D.     MDO/MEDQ  D:  09/10/2010  T:  09/10/2010  Job:  [id number]  Electronically Signed by Durene Romans M.D. on 09/10/2010 08:35:49 PM

## 2010-09-13 NOTE — H&P (Signed)
  [name], [name]             ACCOUNT NO.:  [id number]  MEDICAL RECORD NO.:  [id number]          PATIENT TYPE:  LOCATION:                                 FACILITY:  PHYSICIAN:  Madlyn Frankel. Charlann Boxer, M.D.  DATE OF BIRTH:  May 07, 1927  DATE OF ADMISSION:  09/07/2010 DATE OF DISCHARGE:                             HISTORY & PHYSICAL   CHIEF COMPLAINT:  Osteoarthritis, left hip.  HISTORY OF PRESENT ILLNESS:  This is an 77 year old lady with a history of osteoarthritis of the left hip who due to the severity of osteoarthritis is now scheduled for anterior total hip arthroplasty of her left hip.  The surgery, risks, benefits, and aftercare were discussed with the patient.  Questions invited and answered.  She does have peripheral vascular disease and is not a candidate for tranexamic acid and she is going to be going to a rehab facility postoperatively, so home prescriptions were not given today.  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  CURRENT MEDICATIONS: 1. Amlodipine 10 mg daily. 2. Triamterene and hydrochlorothiazide 75/50 mg daily. 3. Multivitamin daily. 4. Ventolin HFA 108 mcg 2 puffs q.4 h. P.r.n.  MEDICAL ILLNESSES:  Peripheral vascular disease, hypertension, COPD, emphysema with asthma, and a cataract in left eye which has not had surgery.  Also, history of diverticulitis and occasional urinary incontinence.  FAMILY HISTORY:  Positive for CHF, arthritis, and uterine cancer.  SOCIAL HISTORY:  The patient is widowed.  She is retired.  She does not smoke or drink.  She lives alone.  PHYSICAL EXAMINATION:  GENERAL:  A well-developed, well-nourished lady with moderate distress in her left hip. VITAL SIGNS:  Blood pressure 180/82, respirations 18, pulse 78 and regular. HEENT:  Head normocephalic.  Nose patent.  Ears patent.  Pupils equal, round, and reactive to light.  Throat without injection. NECK:  Supple without adenopathy.   Carotids 2+ without bruits. CHEST:  Clear to auscultation.  No rales or rhonchi.  Respirations 18. HEART:  Regular rate and rhythm at 78 beats per minute without murmur. ABDOMEN:  Soft with active bowel sounds.  No masses or organomegaly. NEUROLOGIC:  The patient is alert and oriented to time, place, and person.  Cranial nerves II-XII grossly intact. EXTREMITIES:  Left hip with about a 5-10 degrees flexion contracture with further flexion to 85 degrees and about 25 degrees arc of rotation. Sensation and circulation are intact.  ASSESSMENT:  Left hip osteoarthritis.  PLAN:  Left total hip arthroplasty, anterior approach.     Jaquelyn Bitter. Chabon, P.A.   ______________________________ Madlyn Frankel Charlann Boxer, M.D.    SJC/MEDQ  D:  08/25/2010  T:  08/26/2010  Job:  [id number]  Electronically Signed by Jodene Nam P.A. on 09/13/2010 09:53:56 AM Electronically Signed by Durene Romans M.D. on 09/13/2010 12:14:37 PM

## 2010-10-29 NOTE — Assessment & Plan Note (Signed)
[facility]                             PRIMARY CARE OFFICE NOTE   NAME:[name], [name]                    MRN:          [id number]  DATE:03/27/2006                            DOB:          [birth year]    ADDENDUM:  Because of the patient's probable radiculopathy, the patient is  started on a prednisone burst and taper at 40 mg daily x3, 20 mg daily x3,  10 mg daily x6.            ______________________________  Rosalyn Gess. Norins, MD      MEN/MedQ  DD:  03/28/2006  DT:  03/28/2006  Job #:  [id number]

## 2010-10-29 NOTE — Assessment & Plan Note (Signed)
[facility]                             PRIMARY CARE OFFICE NOTE   [name], [name]                    MRN:          [id number]  DATE:03/27/2006                            DOB:          [birth year]    [name] is a 77 year old Caucasian woman who presents for wellness  examination.  She was last seen in the office July 18, 2005 for cough.  Last full physical exam was January 25, 2006.  In the interval, the patient  has been doing well except for the development of approximately three weeks  ago of severe pain in her left leg.  She reports that pain will radiate down  from her thigh to her leg.  This has brought her to tears.  It has caused  her to have trouble with ambulation.  She reports that lying down also  causes her to have significant pain in her back.  Patient has been  complaining of mild hip pain and back pain for some time but this has been a  more recent exacerbation of rather sudden onset.  No prior radiographic  studies are available.   The patient reports she otherwise has been doing well with no other medical  complaints or problems.   PAST MEDICAL HISTORY:  1. Tonsillectomy remote.  2. Hysterectomy remote.  3. Appendectomy 1980.  4. Lipoma excised in 1993.  5. Dilation and curettage x3.   MEDICAL ILLNESSES:  1. Usual childhood diseases.  2. Hypertension.  3. Reactive airway disease exacerbated by cold.  4. Hyperlipidemia.   CURRENT MEDICATIONS:  1. Norvasc 10 mg daily.  2. Maxzide 75/50 daily.  3. Lipitor 20 mg daily.  4. Multivitamins daily.   SOCIAL HISTORY:  The patient is widowed after 38 years of marriage in 2001.  She is very active in her church.  She lives alone.  She has very supportive  family with her daughter being present at today's visit.   FAMILY HISTORY:  Noncontributory.   REVIEW OF SYSTEMS:  Negative for any fevers or chills.  No ENT,  cardiovascular, respiratory, GI or GU complaints.   PHYSICAL EXAMINATION:  VITAL SIGNS:  Temperature was 97.8, blood pressure  159/83, pulse 106, weight 170.  GENERAL:  Pleasant elderly woman who is uncomfortable and in pain with  movement but was able to get up to the exam table with 1+ assistance.  HEENT:  Normocephalic and atraumatic.  External auditory canals and tympanic  membranes were unremarkable.  Oropharynx without lesions.  Conjunctivae,  sclerae were clear.  Pupils equal, round and reactive to light and  accommodation.  Funduscopic exam was unremarkable with hand held instrument  with normal disk margins.  NECK:  Supple without thyromegaly.  NODES:  No adenopathy was noted  in the cervical or supraclavicular regions.  CHEST:  No CVA tenderness.  LUNGS:  Clear to auscultation and percussion.  BREAST:  Skin was normal, nipples without discharge, no fixed mass lesion or  abnormalities were noted.  There was no axillary adenopathy.  CARDIOVASCULAR:  2+ radial pulses, no JVD or carotid bruits.  She had a  quiet precordium with a regular rate and rhythm without murmurs, rubs or  gallops.  ABDOMEN:  Soft with no guarding or rebound, no organosplenomegaly was  appreciated.  PELVIC/RECTAL:  Deferred.  EXTREMITIES:  No cyanosis, clubbing or edema, no deformities are noted.  Patient had significant discomfort with straight leg maneuver of the left  leg.  NEUROLOGIC:  The patient had 2+ deep tendon reflexes at the patellar  tendons.  She had trace reflex at the Achilles tendon.  It was equal  bilaterally.  The patient had mildly diminished soft touch perception.  She  had decreased pin prick at the lateral aspect of her left leg.  She had  diminished deep vibratory sensation.   LABORATORY DATA:  Hemoglobin 14.3, white blood cell count 9300 with normal  differential.  Chemistries with a normal sodium and potassium that is 2.8.  Chloride 101, glucose is 109, creatinine 1.1, liver functions were normal.  Cholesterol 194, triglycerides  434, HDL 34.8, LDL was 68.1, TSH was normal  at 4.68.  Urinalysis was significant with 3 to 5 white blood cells, 2+  bacteria, 6 to 10 epithelial cells were noted.   ASSESSMENT AND PLAN:  1. Radiculopathy.  Patient with significant radiculopathy in the left leg      with pain, decreased sensation with limitation in her activities and      movement.  Suspect she has an L4-5, L5-S1 disk.  Plan:  Patient is      scheduled for urgent MRI to be done same day of the lumbosacral spine.      For pain management the patient is prescribed Vicodin 5/500 to take      every four hours as needed.  Will follow up with her on the 16th with      regard to MRI results.  2. Health maintenance exam was unremarkable.  3. Blood pressure stable at mildly elevated level of 159/83 on Norvasc and      Maxzide.  Patient is in pain and suspect this may be contributing to      her blood pressure elevation.  Last reading was 139/75.  Plan:  Will      continue the patient's present medications and follow her blood      pressures along.  When she is back to her pain-free state, if her blood      pressure remains elevated will adjust her medications.  4. Hyperlipidemia.  Patient with excellent result in regard to her LDL.      HDL is slightly depressed and triglycerides were elevated.  Plan:      Patient to continue on Lipitor 20.  She is to continue with a modified      diet which will help bring down her triglycerides.  5. Hypokalemia.  The patient is on a diuretic despite being potassium      sparing product.  Plan:  The patient is to take potassium 16 mEq at      time a prescription is filled, to repeat 16 mEq 12 hours later then to      take 8 mEq daily.  The patient will need to have follow-up      laboratories.  6. Health maintenance.  The patient's last mammogram is from February 28, 2005 and she is scheduled for a mammogram in the future.  She did have     a chest x-ray July 18, 2005 which was  significant for possible early  infiltrate, no other abnormalities.  Last EKG is on the chart from July      2004 and shows left axis deviation but otherwise was unremarkable.   SUMMARY:  This is a pleasant woman whose health maintenance is unremarkable,  her medical problems are stable.  The patient does have an acute problem  with severe pain in her left leg suggestive of a radiculopathy.  Follow-up  as above.   ADDENDUM:  Because of the patient's probable radiculopathy, the patient is  started on a prednisone burst and taper at 40 mg daily x3, 20 mg daily x3,  10 mg daily x6.              ______________________________  Rosalyn Gess. Norins, MD     Job# [id number]  MEN/MedQ  DD:  03/28/2006  DT:  03/28/2006  Job #:  [id number]

## 2010-10-29 NOTE — Op Note (Signed)
[name], [name]             ACCOUNT NO.:  [id number]   MEDICAL RECORD NO.:  [id number]          PATIENT TYPE:  INP   LOCATION:  2899                         FACILITY:  [facility]   PHYSICIAN:  Kathaleen Maser. Pool, M.D.    DATE OF BIRTH:  04-29-1927   DATE OF PROCEDURE:  06/12/2006  DATE OF DISCHARGE:                               OPERATIVE REPORT   PREOPERATIVE DIAGNOSIS:  Left L4-5 degenerative spondylolisthesis with  stenosis and associated left L4-5 disc herniation.   POSTOPERATIVE DIAGNOSIS:  Left L4-5 degenerative spondylolisthesis with  stenosis and associated left L4-5 disc herniation.   PROCEDURE NAMES:  1. Left L4-5 decompressive laminotomy, decompressing the left L4 and      left L5 nerve roots.  2. Left L4-5 microdiskectomy.   SURGEON:  Kathaleen Maser. Pool, M.D.   ASSISTANT:  Reinaldo Meeker, M.D.   ANESTHESIA:  General orotracheal.   INDICATIONS:  [name] is a 77 year old female with a history of  severe left lower extremity pain consistent with both a left-sided L4  and L5 radiculopathy.  Workup demonstrates evidence of degenerative  spondylolisthesis with marked stenosis, worse on the left side.  The  situation is complicated by a left-sided L4-5 disc herniation with a  superior free fragment.  The patient has been counseled as to her  options.  She decided to proceed with a left-sided L4-5 decompression  and microdiskectomy.   OPERATIVE NOTE:  The patient was brought to the operating room and  placed on the operating table in the supine position.  After an adequate  level of anesthesia was achieved, the patient was positioned prone onto  the Wilson frame and appropriately padded.  The patient's lumbar region  was prepped and draped sterilely.  A 10 blade was used to make a linear  incision overlying the L4-5 interspace.  This was carried down sharply  in midline.  Subperiosteal dissection was performed exposing the lamina  and facet joints of what was felt to be  L4-5.  A deep self-retaining  retractor was placed.  Intraoperative x-rays were taken, and the exact  level was indeterminate.  A small laminotomy was performed, and a probe  was placed into the disc space, which was found to be the L5-S1 level.  Dissection was then redirected one level cephalad.  Decompressive  laminotomy then performed using the high-speed drill and Kerrison  rongeurs to remove the inferior two-thirds of the lamina of L4, the  medial aspect of the L4-5 facet joint, and the entire left hemilamina of  L5.  Ligament flavum was then elevated and resected using Kerrison  rongeurs.  The underlying thecal sac and exiting L4 and L5 nerve roots  identified.  Wide decompressive foraminotomies were then performed along  the course of each exiting nerve root.  Epidural venous plexus was  coagulated and cut.  Microscope was brought into the field to use for  microdissection.   Thecal sac and L5 nerve were mobilized and directed towards midline.  The superior fragment of disc herniation was encountered.  This was then  dissected free and removed using pituitary rongeurs.  The disc space  itself was grossly bulging as would be expected from a degenerative  spondylolisthesis.  It was decided that it would be further  destabilizing to cut the annulus and that as there was no gross  disruption of the disc a more aggressive diskectomy would not be  required.  Wound was then irrigated with antibiotic solution.  Gelfoam  was placed topically for hemostasis which was found to be good.  Microscope and retractor system were removed.  Hemostasis was achieved  with electrocautery.  The wound was closed in layers with Vicryl suture.  Steri-Strips and a sterile dressing were applied.  There were no  apparent complications.  The patient tolerated the procedure well, and  she returned to the recovery room for postoperative care.           ______________________________  Kathaleen Maser. Pool,  M.D.     HAP/MEDQ  D:  06/12/2006  T:  06/12/2006  Job:  [id number]

## 2011-06-03 ENCOUNTER — Encounter: Payer: Federal, State, Local not specified - PPO | Admitting: Internal Medicine

## 2011-06-09 ENCOUNTER — Encounter: Payer: Self-pay | Admitting: Internal Medicine

## 2011-06-09 ENCOUNTER — Other Ambulatory Visit: Payer: Self-pay | Admitting: *Deleted

## 2011-06-09 MED ORDER — TRIAMTERENE-HCTZ 75-50 MG PO TABS: 1.0000 | ORAL_TABLET | Freq: Every day | ORAL | Status: DC

## 2011-06-10 ENCOUNTER — Other Ambulatory Visit: Payer: Self-pay | Admitting: *Deleted

## 2011-06-10 MED ORDER — TRIAMTERENE-HCTZ 75-50 MG PO TABS: 1.0000 | ORAL_TABLET | Freq: Every day | ORAL | Status: DC

## 2011-06-15 ENCOUNTER — Encounter: Payer: Self-pay | Admitting: Internal Medicine

## 2011-06-15 ENCOUNTER — Ambulatory Visit (INDEPENDENT_AMBULATORY_CARE_PROVIDER_SITE_OTHER): Payer: Medicare Other | Admitting: Internal Medicine

## 2011-06-15 VITALS — BP 172/90 | HR 100 | Temp 98.1°F | Wt 162.0 lb

## 2011-06-15 DIAGNOSIS — Z0000 Encounter for general adult medical examination without abnormal findings: Secondary | ICD-10-CM

## 2011-06-15 DIAGNOSIS — M25559 Pain in unspecified hip: Secondary | ICD-10-CM

## 2011-06-15 DIAGNOSIS — J449 Chronic obstructive pulmonary disease, unspecified: Secondary | ICD-10-CM

## 2011-06-15 DIAGNOSIS — J4489 Other specified chronic obstructive pulmonary disease: Secondary | ICD-10-CM

## 2011-06-15 DIAGNOSIS — E785 Hyperlipidemia, unspecified: Secondary | ICD-10-CM

## 2011-06-15 DIAGNOSIS — I10 Essential (primary) hypertension: Secondary | ICD-10-CM

## 2011-06-15 MED ORDER — MUPIROCIN 2 % EX OINT: TOPICAL_OINTMENT | Freq: Two times a day (BID) | CUTANEOUS | Status: AC

## 2011-06-15 MED ORDER — ALBUTEROL SULFATE HFA 108 (90 BASE) MCG/ACT IN AERS: 2.0000 | INHALATION_SPRAY | RESPIRATORY_TRACT | Status: DC | PRN

## 2011-06-15 MED ORDER — TRIAMTERENE-HCTZ 75-50 MG PO TABS: 1.0000 | ORAL_TABLET | Freq: Every day | ORAL | Status: DC

## 2011-06-15 MED ORDER — AMLODIPINE BESYLATE 10 MG PO TABS: 10.0000 mg | ORAL_TABLET | Freq: Every day | ORAL | Status: DC

## 2011-06-15 NOTE — Progress Notes (Signed)
Subjective:    Patient ID: [name], female    DOB: Feb 28, 1927, 78 y.o.   MRN: [id number]  HPI  The patient is here for annual Medicare wellness examination and management of other chronic and acute problems. She did have hip surgery March 27th and has made a good recovery.   The risk factors are reflected in the social history.  The roster of all physicians providing medical care to patient - is listed in the Snapshot section of the chart.  Activities of daily living:  The patient is 100% inedpendent in all ADLs: dressing, toileting, feeding as well as independent mobility  Home safety : The patient has smoke detectors in the home. They wear seatbelts. No firearms at home. There is no violence in the home. No falls and she has kept the house fall proof. Advised to have rails in shower and by commode  There is no risks for hepatitis, STDs or HIV. There is no   history of blood transfusion. They have no travel history to infectious disease endemic areas of the world.  The patient has not seen their dentist in the last six month. They have not seen their eye doctor in the last year. They admit to any hearing difficulty and have not had audiologic testing in the last year.  They do not  have excessive sun exposure. Discussed the need for sun protection: hats, long sleeves and use of sunscreen if there is significant sun exposure.   Diet: the importance of a healthy diet is discussed. They do have a healthy (unhealthy-high fat/fast food) diet.  The patient does not have a regular exercise program.  The benefits of regular aerobic exercise were discussed.  Depression screen: there are no signs or vegative symptoms of depression- irritability, change in appetite, anhedonia, sadness/tearfullness. Occasional sadness but not chronic or persistent.  Cognitive assessment: the patient manages all their financial and personal affairs and is actively engaged. They could relate day,date,year and  events; recalled 3/3 objects at 3 minutes; performed clock-face test normally.  The following portions of the patient's history were reviewed and updated as appropriate: allergies, current medications, past family history, past medical history,  past surgical history, past social history  and problem list.  Vision, hearing, body mass index were assessed and reviewed.   During the course of the visit the patient was educated and counseled about appropriate screening and preventive services including : fall prevention , diabetes screening, nutrition counseling, colorectal cancer screening, and recommended immunizations.  Past Medical History  Diagnosis Date  . ALLERGIC RHINITIS 05/29/2007  . CHRONIC OBSTRUCTIVE PULMONARY DISEASE, ACUTE EXACERBATION 11/21/2007  . COLONIC POLYPS, HX OF 11/21/2007  . COPD 11/21/2007  . DIVERTICULOSIS, COLON 11/21/2007  . HIP PAIN, LEFT, CHRONIC 06/01/2010  . HYPERLIPIDEMIA 05/29/2007  . HYPERTENSION 05/29/2007  . Hypopotassemia 06/12/2007  . HYSTERECTOMY, HX OF 05/29/2007  . Other acquired absence of organ 05/29/2007  . Other postprocedural status 05/29/2007  . RADICULOPATHY 05/29/2007  . REACTIVE AIRWAY DISEASE 05/29/2007   Past Surgical History  Procedure Date  . Lumbar laminectomy   . Lipoma excision   . Dilation and curettage of uterus   . Appendectomy   . Abdominal hysterectomy   . Tonsillectomy    Family History  Problem Relation Age of Onset  . Arthritis Mother   . Heart disease Mother   . Uterine cancer Daughter   . Cancer Daughter     ovarian  . Heart disease Father   . Diabetes Neg  Hx   . COPD Neg Hx   . Stroke Neg Hx   . Kidney disease Neg Hx    History   Social History  . Marital Status: Widowed    Spouse Name: N/A    Number of Children: N/A  . Years of Education: N/A   Occupational History  . Not on file.   Social History Main Topics  . Smoking status: Former Smoker    Quit date: 06/14/1980  . Smokeless tobacco: Never  Used  . Alcohol Use: No  . Drug Use: No  . Sexually Active: No   Other Topics Concern  . Not on file   Social History Narrative   HSG. Married 2048-10-25- 34yr/divorced. Married 10-25-60- widowed Oct 26, 1999. 2 daughters-'54 (deceased ovarian cancer), Oct 26, 2063. 1 son 10/25/2060.  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  no heroic measures; i.e. Feeding tubes, mechanical ventilation        Review of Systems Constitutional:  Negative for fever, chills, activity change and unexpected weight change.  HEENT:  Negative for hearing loss, ear pain, congestion, neck stiffness and postnasal drip. Negative for sore throat or swallowing problems. Negative for dental complaints.   Eyes: Negative for vision loss or change in visual acuity.  Respiratory: Negative for chest tightness and wheezing. Negative for DOE.   Cardiovascular: Negative for chest pain or palpitations. No decreased exercise tolerance Gastrointestinal: No change in bowel habit. No bloating or gas. No reflux or indigestion Genitourinary: Negative for urgency, frequency, flank pain and difficulty urinating.  Musculoskeletal: Negative for myalgias, back pain, arthralgias and gait problem.  Neurological: Negative for dizziness, tremors, weakness and headaches.  Hematological: Negative for adenopathy.  Psychiatric/Behavioral: Negative for behavioral problems and dysphoric mood.       Objective:   Physical Exam Vitals reviewed- stable Gen'l: well nourished, well developed white woman in no distress HEENT - [place]/AT, EACs/TMs normal, oropharynx with native dentition in good condition, no buccal or palatal lesions, posterior pharynx clear, mucous membranes moist. C&S clear, PERRLA, fundi - normal Neck - supple, no thyromegaly Nodes- negative submental, cervical, supraclavicular regions Chest - no deformity, no CVAT Lungs - cleat without rales, wheezes. No increased work of breathing Breast - deferred Cardiovascular -  regular rate and rhythm, quiet precordium, no murmurs, rubs or gallops, 2+ radial, DP and PT pulses Abdomen - BS+ x 4, no HSM, no guarding or rebound or tenderness Pelvic - deferred to age Rectal - deferred to age Extremities - no clubbing, cyanosis, edema or deformity.  Neuro - A&O x 3, CN II-XII normal, motor strength normal and equal, DTRs 2+ and symmetrical biceps, radial, and patellar tendons. Cerebellar - no tremor, no rigidity, fluid movement and normal gait. Derm - Head, neck, back, abdomen and extremities without suspicious lesions  Lab Results  Component Value Date   WBC 13.0* 09/09/2010   HGB 11.3* 09/09/2010   HCT 33.6* 09/09/2010   PLT 230 09/09/2010   GLUCOSE 102* 06/16/2011   CHOL 252* 06/16/2011   TRIG 770.0* 06/16/2011   HDL 40.70 06/16/2011   LDLDIRECT 103.3 06/16/2011   ALT 16 06/16/2011   ALT 16 06/16/2011   AST 24 06/16/2011   AST 24 06/16/2011   NA 140 06/16/2011   K 3.7 06/16/2011   CL 99 06/16/2011   CREATININE 1.2 06/16/2011   BUN 28* 06/16/2011   CO2 32 06/16/2011   TSH 4.23 06/04/2009   INR 1.00 08/31/2010  Assessment & Plan:

## 2011-06-16 ENCOUNTER — Other Ambulatory Visit (INDEPENDENT_AMBULATORY_CARE_PROVIDER_SITE_OTHER): Payer: Medicare Other

## 2011-06-16 ENCOUNTER — Telehealth: Payer: Self-pay | Admitting: *Deleted

## 2011-06-16 ENCOUNTER — Other Ambulatory Visit: Payer: Self-pay | Admitting: Internal Medicine

## 2011-06-16 DIAGNOSIS — E785 Hyperlipidemia, unspecified: Secondary | ICD-10-CM

## 2011-06-16 DIAGNOSIS — I10 Essential (primary) hypertension: Secondary | ICD-10-CM

## 2011-06-16 LAB — COMPREHENSIVE METABOLIC PANEL
Albumin: 4.3 g/dL (ref 3.5–5.2)
BUN: 28 mg/dL — ABNORMAL HIGH (ref 6–23)
Calcium: 10.4 mg/dL (ref 8.4–10.5)
Chloride: 99 mEq/L (ref 96–112)
Creatinine, Ser: 1.2 mg/dL (ref 0.4–1.2)
GFR: 43.98 mL/min — ABNORMAL LOW (ref >60.00)
Glucose, Bld: 102 mg/dL — ABNORMAL HIGH (ref 70–99)
Potassium: 3.7 mEq/L (ref 3.5–5.1)

## 2011-06-16 LAB — HEPATIC FUNCTION PANEL
ALT: 16 U/L (ref 0–35)
Alkaline Phosphatase: 82 U/L (ref 39–117)
Bilirubin, Direct: 0.1 mg/dL (ref 0.0–0.3)
Total Bilirubin: 0.7 mg/dL (ref 0.3–1.2)
Total Protein: 7.4 g/dL (ref 6.0–8.3)

## 2011-06-16 LAB — LIPID PANEL
HDL: 40.7 mg/dL (ref >39.00)
VLDL: 154 mg/dL — ABNORMAL HIGH (ref 0.0–40.0)

## 2011-06-16 LAB — LDL CHOLESTEROL, DIRECT: Direct LDL: 103.3 mg/dL

## 2011-06-16 NOTE — Telephone Encounter (Signed)
Yes come for labs at her conveniience. Orders entered

## 2011-06-16 NOTE — Telephone Encounter (Signed)
Pt states that she was here for her Annual Exam yesterday and that she did not go to the lab after her visit for lab work-pt wants to know if MD wants her to have lab work done.

## 2011-06-16 NOTE — Telephone Encounter (Signed)
Pt informed

## 2011-06-18 DIAGNOSIS — Z0000 Encounter for general adult medical examination without abnormal findings: Secondary | ICD-10-CM | POA: Insufficient documentation

## 2011-06-18 NOTE — Assessment & Plan Note (Signed)
Stable with no respiratory distress.  Plan - continue present regimen

## 2011-06-18 NOTE — Assessment & Plan Note (Signed)
Patient has done well - normal gait, no significant hip pain.

## 2011-06-18 NOTE — Assessment & Plan Note (Signed)
Interval medical history - successful THR left, no other problems. Physical exam is normal. Labs are in normal limits except for triglycerides. Current with colorectal cancer screening. Due for mammography. Immunizations - current w/ pneumonia vaccine. Due for tetanus and shingles vaccines.  In summary - a nice woman who is doing better after hip replacement. She will be seen soon for management of triglycerides and BP

## 2011-06-18 NOTE — Assessment & Plan Note (Signed)
BP Readings from Last 3 Encounters:  06/15/11 172/90  06/01/10 152/88  06/04/09 142/70   Elevated  At today's visit. Usually good control.  Plan - recheck at next office visit - if elevated will adjust medications.

## 2011-06-18 NOTE — Assessment & Plan Note (Signed)
Lab Results  Component Value Date   CHOL 252* 06/16/2011   HDL 40.70 06/16/2011   LDLDIRECT 103.3 06/16/2011   TRIG 770.0* 06/16/2011   CHOLHDL 6 06/16/2011   Cholesterol levels are fine. Triglycerides too high.   Plan - return office visit to discuss management options for elevated triglycerides

## 2011-06-21 ENCOUNTER — Telehealth: Payer: Self-pay | Admitting: *Deleted

## 2011-06-21 NOTE — Telephone Encounter (Signed)
Request new AVS mailed to her--recd but "all smudged" & couldn't read. Placed in outgoing mail.

## 2011-07-07 ENCOUNTER — Encounter: Payer: Self-pay | Admitting: Internal Medicine

## 2011-07-07 ENCOUNTER — Ambulatory Visit (INDEPENDENT_AMBULATORY_CARE_PROVIDER_SITE_OTHER): Payer: Medicare Other | Admitting: Internal Medicine

## 2011-07-07 VITALS — BP 152/82 | HR 103 | Temp 98.2°F | Resp 14 | Wt 166.5 lb

## 2011-07-07 DIAGNOSIS — E785 Hyperlipidemia, unspecified: Secondary | ICD-10-CM

## 2011-07-07 NOTE — Assessment & Plan Note (Signed)
Hypertriglyceridemia - last = 770. Reviewed her labs. Provided information on lipids, triglycerides, diet from UpToDate. She is adamant about not wanting to take medication.  Plan - life-style management           F/u lab 3 months  (greater than 70%  Of a 20 min visit spent on education and counseling)

## 2011-07-07 NOTE — Progress Notes (Signed)
  Subjective:    Patient ID: [name], female    DOB: 16-May-1927, 78 y.o.   MRN: [id number]  HPI Mrs. Haefele returns to discuss her recent lab results with a triglyceride of 770. Her chart is reviewed and she has chronic hypertriglyceridemia but this last test was the highest ever. She is totally asymptomatic.  I have reviewed the patient's medical history in detail and updated the computerized patient record.    Review of Systems System review is negative for any constitutional, cardiac, pulmonary, GI or neuro symptoms or complaints other than as described in the HPI.     Objective:   Physical Exam Filed Vitals:   07/07/11 1354  BP: 152/82  Pulse: 103  Temp: 98.2 F (36.8 C)  Resp: 14          Assessment & Plan:

## 2011-07-07 NOTE — Patient Instructions (Signed)
Your triglycerides are elevated but the remainder of your lipid panel is normal. The concern with triglycerides over 500 is the risk of pancreatitis.  Plan - see handouts.            No medication at this time            Follow-up lab in 3 months - April '13

## 2012-06-18 ENCOUNTER — Ambulatory Visit (INDEPENDENT_AMBULATORY_CARE_PROVIDER_SITE_OTHER): Payer: Medicare Other | Admitting: Internal Medicine

## 2012-06-18 ENCOUNTER — Encounter: Payer: Self-pay | Admitting: Internal Medicine

## 2012-06-18 ENCOUNTER — Other Ambulatory Visit (INDEPENDENT_AMBULATORY_CARE_PROVIDER_SITE_OTHER): Payer: Medicare Other

## 2012-06-18 VITALS — BP 152/74 | HR 100 | Temp 97.8°F | Resp 12 | Ht 64.0 in | Wt 154.0 lb

## 2012-06-18 DIAGNOSIS — Z23 Encounter for immunization: Secondary | ICD-10-CM

## 2012-06-18 DIAGNOSIS — G8929 Other chronic pain: Secondary | ICD-10-CM

## 2012-06-18 DIAGNOSIS — I10 Essential (primary) hypertension: Secondary | ICD-10-CM

## 2012-06-18 DIAGNOSIS — M545 Other chronic pain: Secondary | ICD-10-CM | POA: Insufficient documentation

## 2012-06-18 DIAGNOSIS — E785 Hyperlipidemia, unspecified: Secondary | ICD-10-CM

## 2012-06-18 DIAGNOSIS — Z0000 Encounter for general adult medical examination without abnormal findings: Secondary | ICD-10-CM

## 2012-06-18 DIAGNOSIS — J449 Chronic obstructive pulmonary disease, unspecified: Secondary | ICD-10-CM

## 2012-06-18 LAB — COMPREHENSIVE METABOLIC PANEL
ALT: 16 U/L (ref 0–35)
AST: 24 U/L (ref 0–37)
Albumin: 4.3 g/dL (ref 3.5–5.2)
Alkaline Phosphatase: 68 U/L (ref 39–117)
BUN: 31 mg/dL — ABNORMAL HIGH (ref 6–23)
CO2: 30 mEq/L (ref 19–32)
Calcium: 10.2 mg/dL (ref 8.4–10.5)
Chloride: 99 mEq/L (ref 96–112)
Creatinine, Ser: 1.4 mg/dL — ABNORMAL HIGH (ref 0.4–1.2)
GFR: 39.77 mL/min — ABNORMAL LOW (ref >60.00)
Glucose, Bld: 108 mg/dL — ABNORMAL HIGH (ref 70–99)
Potassium: 3.6 mEq/L (ref 3.5–5.1)
Sodium: 140 mEq/L (ref 135–145)
Total Bilirubin: 1.2 mg/dL (ref 0.3–1.2)
Total Protein: 8.1 g/dL (ref 6.0–8.3)

## 2012-06-18 MED ORDER — AMLODIPINE BESYLATE 10 MG PO TABS: 10.0000 mg | ORAL_TABLET | Freq: Every day | ORAL | Status: DC

## 2012-06-18 MED ORDER — ALBUTEROL SULFATE HFA 108 (90 BASE) MCG/ACT IN AERS: 2.0000 | INHALATION_SPRAY | RESPIRATORY_TRACT | Status: DC | PRN

## 2012-06-18 MED ORDER — TRIAMTERENE-HCTZ 75-50 MG PO TABS: 1.0000 | ORAL_TABLET | Freq: Every day | ORAL | Status: DC

## 2012-06-18 NOTE — Assessment & Plan Note (Signed)
Interval history - notable for on-going back pain; peripheral edema c/w venous insufficiency. Limited physical exam notable for dense cataract left eye. She is aged out of both colonoscopy and mammography. She is given tetanus booster today and is instructed to check insurance coverage for shingles vaccine.  ACP is documented and on record under social history.  In summary - a very nice woman who appears medically stable. She will return as needed. She is to call back if BP is elevated at home.

## 2012-06-18 NOTE — Assessment & Plan Note (Signed)
BP Readings from Last 3 Encounters:  06/18/12 152/74  07/07/11 152/82  06/15/11 172/90   Question of white coat hypertension.  Plan - she is instructed to check BP at home or drugstore and to call if consistently running SBP >140.  If elevated will add BB

## 2012-06-18 NOTE — Assessment & Plan Note (Signed)
Chronic back pain that limits standing time and activity.  Plan  Refer to [place] [facility] PT for evaluation and training in back exercises.

## 2012-06-18 NOTE — Patient Instructions (Addendum)
Back pain - a chronic problem. You will get real benefit from learning to do a set of back exercises Plan Refer to Physical Therapy at [facility] orth[facility] for this purpose  Ankle swelling - this is venous insufficiency. Plan If you are going to sit a prolonged period of time use an ACE wrap on the ankle that will  Become swollen.  Rash - this sounds like folliculitis - a small infection of a hair follicle. This is benign - treat with soap and water.  Overall, you are in pretty good condition.  Labs today and you will receive a report.

## 2012-06-18 NOTE — Assessment & Plan Note (Signed)
Stable with occasional need for rescue bronchodilator. No limitation in activity.

## 2012-06-18 NOTE — Progress Notes (Signed)
Subjective:    Patient ID: [name], female    DOB: 01-11-1927, 79 y.o.   MRN: [id number]  HPI The patient is here for annual Medicare wellness examination and management of other chronic and acute problems. She is feeling pretty well. She continues to have lower back pain: can't stand, can't bend.   Swelling of left ankle that gets worse with sitting, and does have mild paresthesia. Swelling does get better with elevation of the leg/ankle.   She does have occasional outbreak of small red vesicles that are pruitic. Resolves spontaneously - c/w folliculitis.   The risk factors are reflected in the social history.  The roster of all physicians providing medical care to patient - is listed in the Snapshot section of the chart.  Activities of daily living:  The patient is 100% inedpendent in all ADLs: dressing, toileting, feeding as well as independent mobility  Home safety : The patient has smoke detectors in the home. Falls - had one fall, lost her balance. She is aware of positional vertigo. Home is fall safe except no grab bar in the bathroom.  They wear seatbelts most of the time. No firearms at home. There is no violence in the home.   There is no risks for hepatitis, STDs or HIV. There is no history of blood transfusion. They have no travel history to infectious disease endemic areas of the world.  The patient has seen their dentist in the last six month. They have seen their eye doctor in the last year (going to schedule appointment). They deny any hearing difficulty and have not had audiologic testing in the last year.    They do not  have excessive sun exposure. Discussed the need for sun protection: hats, long sleeves and use of sunscreen if there is significant sun exposure.   Diet: the importance of a healthy diet is discussed. They do have a healthy diet.  The patient has no regular exercise program.  The benefits of regular aerobic exercise were discussed.  Depression  screen: there are no signs or vegative symptoms of depression- irritability, change in appetite, anhedonia, sadness/tearfullness.  Cognitive assessment: the patient manages all their financial and personal affairs and is actively engaged. They could relate day,date,year and events; recalled 1/3 objects at 3 minutes; performed clock-face test normally.  The following portions of the patient's history were reviewed and updated as appropriate: allergies, current medications, past family history, past medical history,  past surgical history, past social history  and problem list.  Past Medical History  Diagnosis Date  . ALLERGIC RHINITIS 05/29/2007  . CHRONIC OBSTRUCTIVE PULMONARY DISEASE, ACUTE EXACERBATION 11/21/2007  . COLONIC POLYPS, HX OF 11/21/2007  . COPD 11/21/2007  . DIVERTICULOSIS, COLON 11/21/2007  . HIP PAIN, LEFT, CHRONIC 06/01/2010  . HYPERLIPIDEMIA 05/29/2007  . HYPERTENSION 05/29/2007  . Hypopotassemia 06/12/2007  . HYSTERECTOMY, HX OF 05/29/2007  . Other acquired absence of organ 05/29/2007  . Other postprocedural status 05/29/2007  . RADICULOPATHY 05/29/2007  . REACTIVE AIRWAY DISEASE 05/29/2007   Past Surgical History  Procedure Date  . Lumbar laminectomy   . Lipoma excision   . Dilation and curettage of uterus   . Appendectomy   . Abdominal hysterectomy   . Tonsillectomy    Family History  Problem Relation Age of Onset  . Arthritis Mother   . Heart disease Mother   . Uterine cancer Daughter   . Cancer Daughter     ovarian  . Heart disease Father   .  Diabetes Neg Hx   . COPD Neg Hx   . Stroke Neg Hx   . Kidney disease Neg Hx    History   Social History  . Marital Status: Widowed    Spouse Name: N/A    Number of Children: N/A  . Years of Education: N/A   Occupational History  . Not on file.   Social History Main Topics  . Smoking status: Former Smoker    Quit date: 06/14/1980  . Smokeless tobacco: Never Used  . Alcohol Use: No  . Drug Use: No  .  Sexually Active: No   Other Topics Concern  . Not on file   Social History Narrative   HSG. Married Nov 13, 2048- 68yr/divorced. Married 11/13/60- widowed 11-14-99. 2 daughters-'54 (deceased ovarian cancer), November 14, 2063. 1 son 2060/11/13.  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  no heroic measures; i.e. Feeding tubes, mechanical ventilation    Current Outpatient Prescriptions on File Prior to Visit  Medication Sig Dispense Refill  . albuterol (VENTOLIN HFA) 108 (90 BASE) MCG/ACT inhaler Inhale 2 puffs into the lungs every 4 (four) hours as needed.  1 Inhaler  5  . amLODipine (NORVASC) 10 MG tablet Take 1 tablet (10 mg total) by mouth daily.  30 tablet  11  . triamterene-hydrochlorothiazide (MAXZIDE) 75-50 MG per tablet Take 1 tablet by mouth daily. Pt needs to sch appointment         Vision, hearing, body mass index were assessed and reviewed.   During the course of the visit the patient was educated and counseled about appropriate screening and preventive services including : fall prevention , diabetes screening, nutrition counseling, colorectal cancer screening, and recommended immunizations.    Review of Systems Constitutional:  Negative for fever, chills, activity change and unexpected weight change.  HEENT:  Negative for hearing loss, ear pain, congestion, neck stiffness and postnasal drip. Negative for sore throat or swallowing problems. Negative for dental complaints.   Eyes: Negative for vision loss or change in visual acuity, but has night vision problems due to cataract.  Respiratory: Positive for chest tightness and wheezing but this is chronic and stable. Negative for DOE- stable.   Cardiovascular: Negative for chest pain or palpitations. No decreased exercise tolerance Gastrointestinal: No change in bowel habit. No bloating or gas. No reflux or indigestion Genitourinary: Negative for urgency, frequency, flank pain and difficulty urinating. No  nocturia Musculoskeletal: Negative for myalgias, arthralgias and gait problem. Chronic back pain.  Neurological: Negative for dizziness, tremors, weakness and headaches.  Hematological: Negative for adenopathy.  Psychiatric/Behavioral: Negative for behavioral problems and dysphoric mood.       Objective:   Physical Exam Filed Vitals:   06/18/12 0946  BP: 152/74  Pulse: 100  Temp: 97.8 F (36.6 C)  Resp: 12   Wt Readings from Last 3 Encounters:  06/18/12 154 lb 0.6 oz (69.872 kg)  07/07/11 166 lb 8 oz (75.524 kg)  06/15/11 162 lb (73.483 kg)   Gen'l: well nourished, well developed white Woman in no distress HEENT - [place]/AT, EACs/TMs normal, oropharynx with native dentition in good condition, no buccal or palatal lesions, posterior pharynx clear, mucous membranes moist. C&S clear, PERRLA, fundi - normal Neck - supple, no thyromegaly Nodes- negative submental, cervical, supraclavicular regions Chest - no deformity, no CVAT Lungs - clear without rales, wheezes. No increased work of breathing Breast - deferred Cardiovascular - regular rate and rhythm, quiet precordium, no murmurs, rubs or gallops, 2+ radial,  DP and PT pulses Abdomen - BS+ x 4, no HSM, no guarding or rebound or tenderness Pelvic - deferred to age Rectal - deferred  Extremities - no clubbing, cyanosis, edema or deformity.  Neuro - A&O x 3, CN II-XII normal, motor strength normal and equal, DTRs 2+ and symmetrical biceps, radial, and patellar tendons. Cerebellar - no tremor, no rigidity, fluid movement and normal gait. Derm - Head, neck, back, abdomen and extremities without suspicious lesions  Lab Results  Component Value Date   WBC 13.0* 09/09/2010   HGB 11.3* 09/09/2010   HCT 33.6* 09/09/2010   PLT 230 09/09/2010   GLUCOSE 108* 06/18/2012   CHOL 252* 06/16/2011   TRIG 770.0* 06/16/2011   HDL 40.70 06/16/2011   LDLDIRECT 103.3 06/16/2011   ALT 16 06/18/2012   AST 24 06/18/2012   NA 140 06/18/2012   K 3.6 06/18/2012   CL 99  06/18/2012   CREATININE 1.4* 06/18/2012   BUN 31* 06/18/2012   CO2 30 06/18/2012   TSH 1.99 06/18/2012   INR 1.00 08/31/2010          Assessment & Plan:

## 2012-06-18 NOTE — Assessment & Plan Note (Signed)
Lipid panel in '13 with a well controlled LDL(bad cholestero) but high triglycerides. No need to repeat that test this year.

## 2012-06-20 ENCOUNTER — Other Ambulatory Visit: Payer: Self-pay | Admitting: Internal Medicine

## 2012-09-14 ENCOUNTER — Other Ambulatory Visit: Payer: Self-pay | Admitting: Internal Medicine

## 2012-12-04 ENCOUNTER — Encounter: Payer: Self-pay | Admitting: Gastroenterology

## 2013-05-23 ENCOUNTER — Ambulatory Visit: Payer: Self-pay | Admitting: Podiatry

## 2013-05-30 ENCOUNTER — Ambulatory Visit (INDEPENDENT_AMBULATORY_CARE_PROVIDER_SITE_OTHER): Payer: Medicare Other | Admitting: Podiatry

## 2013-05-30 ENCOUNTER — Encounter: Payer: Self-pay | Admitting: Podiatry

## 2013-05-30 VITALS — BP 116/97 | HR 85 | Resp 16

## 2013-05-30 DIAGNOSIS — B351 Tinea unguium: Secondary | ICD-10-CM

## 2013-05-30 DIAGNOSIS — M79609 Pain in unspecified limb: Secondary | ICD-10-CM

## 2013-05-30 NOTE — Progress Notes (Signed)
Subjective:     Patient ID: [name], female   DOB: [birth year], 79 y.o.   MRN: [id number]  HPI patient is found to have nail disease with thickness 1-5 both feet that are painful and impossible to cut   Review of Systems     Objective:   Physical Exam Neurovascular status intact with thick nail disease 1-5 both feet    Assessment:     Mycotic nail infection with pain 1-5 both feet    Plan:     Debridement painful nailbeds 1-5 both feet with no bleeding noted

## 2013-06-28 ENCOUNTER — Emergency Department (INDEPENDENT_AMBULATORY_CARE_PROVIDER_SITE_OTHER)
Admission: EM | Admit: 2013-06-28 | Discharge: 2013-06-28 | Disposition: A | Discharge status: Home / Self Care | Payer: Medicare Other | Source: Home / Self Care | Attending: Family Medicine | Admitting: Family Medicine

## 2013-06-28 ENCOUNTER — Encounter (HOSPITAL_COMMUNITY): Payer: Self-pay | Admitting: Emergency Medicine

## 2013-06-28 DIAGNOSIS — J029 Acute pharyngitis, unspecified: Secondary | ICD-10-CM | POA: Diagnosis not present

## 2013-06-28 DIAGNOSIS — J309 Allergic rhinitis, unspecified: Secondary | ICD-10-CM | POA: Diagnosis not present

## 2013-06-28 DIAGNOSIS — J300 Vasomotor rhinitis: Secondary | ICD-10-CM

## 2013-06-28 LAB — POCT RAPID STREP A: Streptococcus, Group A Screen (Direct): NEGATIVE

## 2013-06-28 MED ORDER — IPRATROPIUM BROMIDE 0.06 % NA SOLN: 2.0000 | Freq: Four times a day (QID) | NASAL | Status: DC | PRN

## 2013-06-28 MED ORDER — PREDNISONE 10 MG PO TABS: 20.0000 mg | ORAL_TABLET | Freq: Every day | ORAL | Status: DC

## 2013-06-28 NOTE — ED Notes (Signed)
C/o cold symptoms that include sore throat with difficulty swallowing, chest congestion, nasal drainage, cough with mucous at times that started 4 days ago. Pain is 8/10. Stated that she has tried home remedies for sore throat with no relief. Denies any n/v/d. Written by: Lenore Manner, SMA

## 2013-06-28 NOTE — Discharge Instructions (Signed)
Thank you for coming in today. Use Atrovent nasal spray Take prednisone daily for 5 day Use Tylenol for pain control.  Call or go to the emergency room if you get worse, have trouble breathing, have chest pains, or palpitations.  Followup with your primary care Dr. if not getting better.

## 2013-06-28 NOTE — ED Provider Notes (Signed)
[name] is a 80 y.o. female who presents to Urgent Care today for 5 days of sore throat rated 8/10 days of congestion and runny nose. Patient has tried home remedies which have not helped. Denies any shortness of breath nausea vomiting or diarrhea. She denies any chest pain. She notes the pain is worse with swallowing. She notes that she tends to have a runny nose chronically especially when eating food.   Past Medical History  Diagnosis Date  . ALLERGIC RHINITIS 05/29/2007  . CHRONIC OBSTRUCTIVE PULMONARY DISEASE, ACUTE EXACERBATION 11/21/2007  . COLONIC POLYPS, HX OF 11/21/2007  . COPD 11/21/2007  . DIVERTICULOSIS, COLON 11/21/2007  . HIP PAIN, LEFT, CHRONIC 06/01/2010  . HYPERLIPIDEMIA 05/29/2007  . HYPERTENSION 05/29/2007  . Hypopotassemia 06/12/2007  . HYSTERECTOMY, HX OF 05/29/2007  . Other acquired absence of organ 05/29/2007  . Other postprocedural status(V45.89) 05/29/2007  . RADICULOPATHY 05/29/2007  . REACTIVE AIRWAY DISEASE 05/29/2007   History  Substance Use Topics  . Smoking status: Former Smoker    Quit date: 06/14/1980  . Smokeless tobacco: Never Used  . Alcohol Use: No   ROS as above Medications: No current facility-administered medications for this encounter.   Current Outpatient Prescriptions  Medication Sig Dispense Refill  . amLODipine (NORVASC) 10 MG tablet Take 1 tablet (10 mg total) by mouth daily.  30 tablet  11  . multivitamin-iron-minerals-folic acid (CENTRUM) chewable tablet Chew 1 tablet by mouth daily.      Marland Kitchen triamterene-hydrochlorothiazide (MAXZIDE) 75-50 MG per tablet Take 1 tablet by mouth daily. Pt needs to sch appointment  30 tablet  11  . albuterol (VENTOLIN HFA) 108 (90 BASE) MCG/ACT inhaler Inhale 2 puffs into the lungs every 4 (four) hours as needed.  1 Inhaler  5  . ipratropium (ATROVENT) 0.06 % nasal spray Place 2 sprays into both nostrils 4 (four) times daily as needed for rhinitis.  15 mL  10  . predniSONE (DELTASONE) 10 MG tablet  Take 2 tablets (20 mg total) by mouth daily.  10 tablet  0  . VENTOLIN HFA 108 (90 BASE) MCG/ACT inhaler INHALE 2 PUFFS BY MOUTH INTO THE LUNGS EVERY 4 HOURS AS NEEDED  18 g  1    Exam:  BP 142/90  Pulse 108  Temp(Src) 98.7 F (37.1 C) (Oral)  Resp 18  SpO2 97% Gen: Well NAD HEENT: EOMI,  MMM posterior pharynx with cobblestoning. Tympanic membranes are normal appearing bilaterally. Nasal turbinates are mildly inflamed. Nontender maxillary sinus. No significant tender lymphadenopathy Lungs: Normal work of breathing. CTABL Heart: RRR no MRG Abd: NABS, Soft. NT, ND Exts: Brisk capillary refill, warm and well perfused.   Results for orders placed during the hospital encounter of 06/28/13 (from the past 24 hour(s))  POCT RAPID STREP A ([facility])     Status: None   Collection Time    06/28/13 10:32 AM      Result Value Range   Streptococcus, Group A Screen (Direct) NEGATIVE  NEGATIVE   No results found.  Assessment and Plan: 80 y.o. female with viral pharyngitis. Plan to treat with Atrovent nasal spray prednisone and Tylenol.  Patient also has vasomotor rhinitis. Will continue using Atrovent nasal spray as needed. Followup with primary care provider  Discussed warning signs or symptoms. Please see discharge instructions. Patient expresses understanding.    Gregor Hams, MD 06/28/13 1106

## 2013-06-30 LAB — CULTURE, GROUP A STREP

## 2013-07-01 ENCOUNTER — Other Ambulatory Visit: Payer: Self-pay | Admitting: Internal Medicine

## 2013-07-17 ENCOUNTER — Encounter: Payer: Self-pay | Admitting: Internal Medicine

## 2013-07-17 ENCOUNTER — Other Ambulatory Visit: Payer: Medicare Other

## 2013-07-17 ENCOUNTER — Ambulatory Visit (INDEPENDENT_AMBULATORY_CARE_PROVIDER_SITE_OTHER): Payer: Medicare Other | Admitting: Internal Medicine

## 2013-07-17 VITALS — BP 166/80 | HR 101 | Temp 97.4°F | Ht 64.0 in | Wt 151.8 lb

## 2013-07-17 DIAGNOSIS — E876 Hypokalemia: Secondary | ICD-10-CM

## 2013-07-17 DIAGNOSIS — I10 Essential (primary) hypertension: Secondary | ICD-10-CM

## 2013-07-17 DIAGNOSIS — Z7189 Other specified counseling: Secondary | ICD-10-CM

## 2013-07-17 DIAGNOSIS — J449 Chronic obstructive pulmonary disease, unspecified: Secondary | ICD-10-CM

## 2013-07-17 DIAGNOSIS — G8929 Other chronic pain: Secondary | ICD-10-CM

## 2013-07-17 DIAGNOSIS — E785 Hyperlipidemia, unspecified: Secondary | ICD-10-CM

## 2013-07-17 DIAGNOSIS — M545 Low back pain, unspecified: Secondary | ICD-10-CM

## 2013-07-17 LAB — BASIC METABOLIC PANEL
BUN: 20 mg/dL (ref 6–23)
CALCIUM: 10.4 mg/dL (ref 8.4–10.5)
CHLORIDE: 100 meq/L (ref 96–112)
CO2: 30 mEq/L (ref 19–32)
Creatinine, Ser: 1.1 mg/dL (ref 0.4–1.2)
GFR: 51.87 mL/min — ABNORMAL LOW (ref >60.00)
GLUCOSE: 94 mg/dL (ref 70–99)
Potassium: 4 mEq/L (ref 3.5–5.1)
SODIUM: 140 meq/L (ref 135–145)

## 2013-07-17 MED ORDER — AMLODIPINE BESYLATE 10 MG PO TABS: 10.0000 mg | ORAL_TABLET | Freq: Every day | ORAL | Status: DC

## 2013-07-17 MED ORDER — TRIAMTERENE-HCTZ 75-50 MG PO TABS: 1.0000 | ORAL_TABLET | Freq: Every day | ORAL | Status: DC

## 2013-07-17 NOTE — Progress Notes (Signed)
Pre visit review using our clinic review tool, if applicable. No additional management support is needed unless otherwise documented below in the visit note. 

## 2013-07-17 NOTE — Patient Instructions (Addendum)
Thanks for coming to see me.  Your blood pressure is just a little high today., BP Readings from Last 3 Encounters:  07/17/13 166/80  06/28/13 142/90  05/30/13 116/97   Previous readings have been ok.  Plan Continue taking your present medications  Lab today - kidney function, sugar and electrolytes  Hand Tingling - normal exam today. You may have some very mild carpal tunnel that causes this. Try wearing a cock-up wrist splint - available in most pharmacies.  Sore throat - this is resolved.  The transient chest pain you reported does not sound heart related. BUT, if you have recurrent chest pain do not hesitate to come in to be evaluated for this.   Chronic low back pain - no change in treatment. Please watch for stomach irritation if you are taking aleve.  Breathing - lungs are clear today and you have a normal oxygen level. It is ok to use albuterol but if you need it more than 3 times a day you must be seen.  Swelling at the ankle - arthritis plus a little fluid retention. Plan - continue to elevate you legs during the day.  Advanced Care planing - As you have stated - Yes for CPR if you have cardiac arrest but NO to mechanical ventilatory or heroic measures. Share your wishes with the whole family. Have one of your children help visit a website called: www.[email or web address].si. This is instructive and helpful in discussing these issues.   Immunizations - I do note that you have decline Prenar pneumonia vaccine.  Lab - results will be mailed to you.  Thank you for allowing me the honor of being your doctor over the years. You will get great care from my partners after my retirement.

## 2013-07-17 NOTE — Progress Notes (Signed)
Subjective:    Patient ID: [name], female    DOB: [birth year], 80 y.o.   MRN: [id number]  HPI The patient is here for annual Medicare wellness examination and management of other chronic and acute problems.  Interval history: Seen Urgent Care Jan 16th - notes and lab reviewed: treated for viral pharyngitis with prednionse and atrovent.  She has had an episode of right sided chest pain that hurt continuously for 2 days and resolved.  Right hand with intermittent paresthesia and weakness.   She does have chronic low back pain which is relieved with Aleve. Continues to have peripheral edema left ankle. She has had some increased SOB for which she has been using albuterol a little more frequently than usual.    The risk factors are reflected in the social history.  The roster of all physicians providing medical care to patient - is listed in the Snapshot section of the chart.  Activities of daily living:  The patient is 100% inedpendent in all ADLs: dressing, toileting, feeding as well as independent mobility  Home safety : The patient has smoke detectors in the home. They wear seatbelts. No firearms at home. There is no violence in the home.   There is no risks for hepatitis, STDs or HIV. There is no   history of blood transfusion. They have no travel history to infectious disease endemic areas of the world.  The patient has seen their dentist in the last six month - had a tooth extracted. They have not seen their eye doctor in the last year - may be 24 months. They deny any hearing difficulty and have not had audiologic testing in the last year.    They do not  have excessive sun exposure. Discussed the need for sun protection: hats, long sleeves and use of sunscreen if there is significant sun exposure.   Diet: the importance of a healthy diet is discussed. They do have a healthy diet.  The patient has a regular exercise program.  The benefits of regular aerobic exercise were  discussed.  Depression screen: there are no signs or vegative symptoms of depression- irritability, change in appetite, anhedonia, sadness/tearfullness.  Cognitive assessment: the patient manages all their financial and personal affairs and is actively engaged.   The following portions of the patient's history were reviewed and updated as appropriate: allergies, current medications, past family history, past medical history,  past surgical history, past social history  and problem list.  Vision, hearing, body mass index were assessed and reviewed.   During the course of the visit the patient was educated and counseled about appropriate screening and preventive services including : fall prevention , diabetes screening, nutrition counseling, colorectal cancer screening, and recommended immunizations.  Past Medical History  Diagnosis Date  . ALLERGIC RHINITIS 05/29/2007  . CHRONIC OBSTRUCTIVE PULMONARY DISEASE, ACUTE EXACERBATION 11/21/2007  . COLONIC POLYPS, HX OF 11/21/2007  . COPD 11/21/2007  . DIVERTICULOSIS, COLON 11/21/2007  . HIP PAIN, LEFT, CHRONIC 06/01/2010  . HYPERLIPIDEMIA 05/29/2007  . HYPERTENSION 05/29/2007  . Hypopotassemia 06/12/2007  . HYSTERECTOMY, HX OF 05/29/2007  . Other acquired absence of organ 05/29/2007  . Other postprocedural status(V45.89) 05/29/2007  . RADICULOPATHY 05/29/2007  . REACTIVE AIRWAY DISEASE 05/29/2007   Past Surgical History  Procedure Laterality Date  . Lumbar laminectomy    . Lipoma excision    . Dilation and curettage of uterus    . Appendectomy    . Abdominal hysterectomy    . Tonsillectomy  Family History  Problem Relation Age of Onset  . Arthritis Mother   . Heart disease Mother   . Uterine cancer Daughter   . Cancer Daughter     ovarian  . Heart disease Father   . Diabetes Neg Hx   . COPD Neg Hx   . Stroke Neg Hx   . Kidney disease Neg Hx    History   Social History  . Marital Status: Widowed    Spouse Name: N/A     Number of Children: N/A  . Years of Education: N/A   Occupational History  . Not on file.   Social History Main Topics  . Smoking status: Former Smoker    Quit date: 06/14/1980  . Smokeless tobacco: Never Used  . Alcohol Use: No  . Drug Use: No  . Sexual Activity: No   Other Topics Concern  . Not on file   Social History Narrative   HSG. Married 10-14-48- 19yr/divorced. Married 10-14-60- widowed 10-15-99. 2 daughters-'54 (deceased ovarian cancer), 10/15/2063. 1 son Oct 14, 2060.  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  no heroic measures; i.e. Feeding tubes, mechanical ventilation    Current Outpatient Prescriptions on File Prior to Visit  Medication Sig Dispense Refill  . albuterol (VENTOLIN HFA) 108 (90 BASE) MCG/ACT inhaler Inhale 2 puffs into the lungs every 4 (four) hours as needed.  1 Inhaler  5  . amLODipine (NORVASC) 10 MG tablet TAKE 1 TABLET BY MOUTH DAILY  30 tablet  5  . multivitamin-iron-minerals-folic acid (CENTRUM) chewable tablet Chew 1 tablet by mouth daily.      Marland Kitchen triamterene-hydrochlorothiazide (MAXZIDE) 75-50 MG per tablet Take 1 tablet by mouth daily. Pt needs to sch appointment  30 tablet  11   No current facility-administered medications on file prior to visit.      Review of Systems Constitutional:  Negative for fever, chills, activity change and unexpected weight change.  HEENT:  Negative for hearing loss, ear pain, congestion, neck stiffness and postnasal drip. Negative for sore throat or swallowing problems. Negative for dental complaints.   Eyes: Negative for vision loss or change in visual acuity.  Respiratory: Negative for chest tightness and wheezing. Negative for DOE.   Cardiovascular: Negative for chest pain or palpitations. No decreased exercise tolerance Gastrointestinal: No change in bowel habit. No bloating or gas. No reflux or indigestion Genitourinary: Negative for urgency, frequency, flank pain and difficulty  urinating.  Musculoskeletal: Negative for myalgias, back pain, arthralgias and gait problem.  Neurological: Negative for dizziness, tremors, weakness and headaches.  Hematological: Negative for adenopathy.  Psychiatric/Behavioral: Negative for behavioral problems and dysphoric mood.       Objective:   Physical Exam Filed Vitals:   07/17/13 1334  BP: 166/80  Pulse: 101  Temp: 97.4 F (36.3 C)   Wt Readings from Last 3 Encounters:  07/17/13 151 lb 12.8 oz (68.856 kg)  06/18/12 154 lb 0.6 oz (69.872 kg)  07/07/11 166 lb 8 oz (75.524 kg)   Gen'l: well nourished, well developed Woman in no distress HEENT - [place]/AT, EACs/TMs normal, oropharynx with native dentition in good condition, no buccal or palatal lesions, posterior pharynx clear, mucous membranes moist. C&S clear, PERRLA, lens with opacification c/w cataracts. Neck - supple, no thyromegaly Nodes- negative submental, cervical, supraclavicular regions Chest - no deformity, no CVAT Lungs - clear without rales, wheezes. No increased work of breathing Breast - deferred to age Cardiovascular - regular rate and  rhythm with frequent PVCs, quiet precordium, no murmurs, rubs or gallops, 2+ radial, DP and PT pulses Abdomen - BS+ x 4, no HSM, no guarding or rebound or tenderness Pelvic - deferred  Rectal - deferred  Extremities - no clubbing, cyanosis, edema or deformity.  Neuro - A&O x 3, CN II-XII normal, motor strength normal and equal, DTRs 2+ and symmetrical biceps, radial, and patellar tendons. Cerebellar - no tremor, no rigidity, fluid movement and normal gait. Derm - Head, neck, back, abdomen and extremities without suspicious lesions  Recent Results (from the past 2160 hour(s))  CULTURE, GROUP A STREP     Status: None   Collection Time    06/28/13 10:21 AM      Result Value Range   Specimen Description THROAT     Special Requests NONE     Culture       Value: No Beta Hemolytic Streptococci Isolated     Performed at [facility]   Report Status 06/30/2013 FINAL    POCT RAPID STREP A ([facility] ONLY)     Status: None   Collection Time    06/28/13 10:32 AM      Result Value Range   Streptococcus, Group A Screen (Direct) NEGATIVE  NEGATIVE  BASIC METABOLIC PANEL     Status: Abnormal   Collection Time    07/17/13  2:47 PM      Result Value Range   Sodium 140  135 - 145 mEq/L   Potassium 4.0  3.5 - 5.1 mEq/L   Chloride 100  96 - 112 mEq/L   CO2 30  19 - 32 mEq/L   Glucose, Bld 94  70 - 99 mg/dL   BUN 20  6 - 23 mg/dL   Creatinine, Ser 1.1  0.4 - 1.2 mg/dL   Calcium 10.4  8.4 - 10.5 mg/dL   GFR 51.87 (*) >60.00 mL/min        Assessment & Plan:

## 2013-07-18 NOTE — Assessment & Plan Note (Signed)
Reviewed and verified

## 2013-07-18 NOTE — Assessment & Plan Note (Signed)
Still has pain but is able to manage all ADLs.

## 2013-07-18 NOTE — Assessment & Plan Note (Signed)
LDL 2 years ago 103! No need for lab this year.

## 2013-07-18 NOTE — Assessment & Plan Note (Signed)
Stable. Not needing rescue medication more than twice a week.

## 2013-07-18 NOTE — Assessment & Plan Note (Addendum)
BP Readings from Last 3 Encounters:  07/17/13 166/80  06/28/13 142/90  05/30/13 116/97   Mild elevation today.  Plan Continue present medication  Recheck BP at home or in the office at her convenience.

## 2013-07-19 ENCOUNTER — Encounter: Payer: Self-pay | Admitting: Internal Medicine

## 2013-08-22 ENCOUNTER — Ambulatory Visit (INDEPENDENT_AMBULATORY_CARE_PROVIDER_SITE_OTHER): Payer: Medicare Other | Admitting: Podiatry

## 2013-08-22 ENCOUNTER — Encounter: Payer: Self-pay | Admitting: Podiatry

## 2013-08-22 DIAGNOSIS — M79609 Pain in unspecified limb: Secondary | ICD-10-CM

## 2013-08-22 DIAGNOSIS — B351 Tinea unguium: Secondary | ICD-10-CM

## 2013-08-24 NOTE — Progress Notes (Signed)
Subjective:     Patient ID: [name], female   DOB: [birth year], 80 y.o.   MRN: [id number]  HPI patient presents with thick nailbeds and pain 1-5 both feet   Review of Systems     Objective:   Physical Exam Neurovascular status intact with mycotic painful nail bed 1-5 both feet that are brittle    Assessment:     Chronic mycotic nail infection 1-5 both feet with pain    Plan:     Debridement of nailbeds 1-5 both feet with no bleeding noted

## 2013-11-21 ENCOUNTER — Encounter: Payer: Self-pay | Admitting: Podiatry

## 2013-11-21 ENCOUNTER — Ambulatory Visit (INDEPENDENT_AMBULATORY_CARE_PROVIDER_SITE_OTHER): Payer: Medicare Other | Admitting: Podiatry

## 2013-11-21 DIAGNOSIS — M79609 Pain in unspecified limb: Secondary | ICD-10-CM

## 2013-11-21 DIAGNOSIS — B351 Tinea unguium: Secondary | ICD-10-CM

## 2013-11-25 NOTE — Progress Notes (Signed)
Subjective:     Patient ID: [name], female   DOB: 1927/05/31, 80 y.o.   MRN: [id number]  HPI patient presents with thick nailbeds 1-5 both feet that she cannot take care of herself   Review of Systems     Objective:   Physical Exam Neurovascular status found to be unchanged with thick yellow brittle nailbeds 1-5 both feet    Assessment:     Mycotic nail infection with pain 1-5 both feet    Plan:     Debris painful nailbeds 1-5 both feet with no iatrogenic bleeding noted

## 2014-03-06 ENCOUNTER — Ambulatory Visit (INDEPENDENT_AMBULATORY_CARE_PROVIDER_SITE_OTHER): Payer: Medicare Other | Admitting: Podiatry

## 2014-03-06 DIAGNOSIS — M79673 Pain in unspecified foot: Secondary | ICD-10-CM

## 2014-03-06 DIAGNOSIS — M79609 Pain in unspecified limb: Secondary | ICD-10-CM

## 2014-03-06 DIAGNOSIS — B351 Tinea unguium: Secondary | ICD-10-CM

## 2014-03-06 NOTE — Progress Notes (Signed)
   Subjective:    Patient ID: [name], female    DOB: [birth year], 79 y.o.   MRN: [id number]  HPI Pt presents for routine debridement   Review of Systems     Objective:   Physical Exam        Assessment & Plan:

## 2014-03-07 NOTE — Progress Notes (Signed)
Subjective:     Patient ID: [name], female   DOB: 08/02/1926, 80 y.o.   MRN: [id number]  HPI patient states my nails are thick and I cannot cut them on both feet   Review of Systems     Objective:   Physical Exam Neurovascular status intact with thick yellow brittle nailbeds 1-5 both feet that are painful    Assessment:     Mycotic nail infection is with pain 1-5 both feet    Plan:     Debride painful nailbeds 1-5 both feet with no iatrogenic bleeding noted      

## 2014-06-02 ENCOUNTER — Ambulatory Visit (INDEPENDENT_AMBULATORY_CARE_PROVIDER_SITE_OTHER): Payer: Medicare Other | Admitting: Podiatry

## 2014-06-02 DIAGNOSIS — M79673 Pain in unspecified foot: Secondary | ICD-10-CM

## 2014-06-02 DIAGNOSIS — B351 Tinea unguium: Secondary | ICD-10-CM

## 2014-06-02 NOTE — Progress Notes (Signed)
Subjective:     Patient ID: [name], female   DOB: 30-Jun-1926, 80 y.o.   MRN: [id number]  HPI patient states my nails are thick and I cannot cut them on both feet   Review of Systems     Objective:   Physical Exam Neurovascular status intact with thick yellow brittle nailbeds 1-5 both feet that are painful    Assessment:     Mycotic nail infection is with pain 1-5 both feet    Plan:     Debride painful nailbeds 1-5 both feet with no iatrogenic bleeding noted

## 2014-06-13 HISTORY — PX: CATARACT EXTRACTION: SUR2

## 2014-06-19 ENCOUNTER — Emergency Department (HOSPITAL_COMMUNITY): Payer: Medicare Other

## 2014-06-19 ENCOUNTER — Encounter (HOSPITAL_COMMUNITY): Payer: Self-pay | Admitting: Emergency Medicine

## 2014-06-19 ENCOUNTER — Emergency Department (HOSPITAL_COMMUNITY)
Admission: EM | Admit: 2014-06-19 | Discharge: 2014-06-19 | Disposition: A | Discharge status: Home / Self Care | Payer: Medicare Other | Attending: Emergency Medicine | Admitting: Emergency Medicine

## 2014-06-19 DIAGNOSIS — M6281 Muscle weakness (generalized): Secondary | ICD-10-CM | POA: Diagnosis not present

## 2014-06-19 DIAGNOSIS — S199XXA Unspecified injury of neck, initial encounter: Secondary | ICD-10-CM | POA: Diagnosis not present

## 2014-06-19 DIAGNOSIS — Y998 Other external cause status: Secondary | ICD-10-CM | POA: Insufficient documentation

## 2014-06-19 DIAGNOSIS — G8929 Other chronic pain: Secondary | ICD-10-CM | POA: Insufficient documentation

## 2014-06-19 DIAGNOSIS — S0181XA Laceration without foreign body of other part of head, initial encounter: Secondary | ICD-10-CM | POA: Diagnosis not present

## 2014-06-19 DIAGNOSIS — M25512 Pain in left shoulder: Secondary | ICD-10-CM

## 2014-06-19 DIAGNOSIS — Z8719 Personal history of other diseases of the digestive system: Secondary | ICD-10-CM | POA: Diagnosis not present

## 2014-06-19 DIAGNOSIS — Z79899 Other long term (current) drug therapy: Secondary | ICD-10-CM | POA: Insufficient documentation

## 2014-06-19 DIAGNOSIS — R29898 Other symptoms and signs involving the musculoskeletal system: Secondary | ICD-10-CM

## 2014-06-19 DIAGNOSIS — R51 Headache: Secondary | ICD-10-CM | POA: Diagnosis not present

## 2014-06-19 DIAGNOSIS — S0990XA Unspecified injury of head, initial encounter: Secondary | ICD-10-CM | POA: Diagnosis not present

## 2014-06-19 DIAGNOSIS — S4992XA Unspecified injury of left shoulder and upper arm, initial encounter: Secondary | ICD-10-CM | POA: Insufficient documentation

## 2014-06-19 DIAGNOSIS — J449 Chronic obstructive pulmonary disease, unspecified: Secondary | ICD-10-CM | POA: Diagnosis not present

## 2014-06-19 DIAGNOSIS — Z8639 Personal history of other endocrine, nutritional and metabolic disease: Secondary | ICD-10-CM | POA: Diagnosis not present

## 2014-06-19 DIAGNOSIS — I10 Essential (primary) hypertension: Secondary | ICD-10-CM | POA: Diagnosis not present

## 2014-06-19 DIAGNOSIS — Z8739 Personal history of other diseases of the musculoskeletal system and connective tissue: Secondary | ICD-10-CM | POA: Insufficient documentation

## 2014-06-19 DIAGNOSIS — Z86010 Personal history of colonic polyps: Secondary | ICD-10-CM | POA: Diagnosis not present

## 2014-06-19 DIAGNOSIS — Z87891 Personal history of nicotine dependence: Secondary | ICD-10-CM | POA: Diagnosis not present

## 2014-06-19 DIAGNOSIS — Y92014 Private driveway to single-family (private) house as the place of occurrence of the external cause: Secondary | ICD-10-CM | POA: Diagnosis not present

## 2014-06-19 DIAGNOSIS — W010XXA Fall on same level from slipping, tripping and stumbling without subsequent striking against object, initial encounter: Secondary | ICD-10-CM | POA: Diagnosis not present

## 2014-06-19 DIAGNOSIS — S8001XA Contusion of right knee, initial encounter: Secondary | ICD-10-CM | POA: Diagnosis not present

## 2014-06-19 DIAGNOSIS — S8002XA Contusion of left knee, initial encounter: Secondary | ICD-10-CM | POA: Diagnosis not present

## 2014-06-19 DIAGNOSIS — Y9389 Activity, other specified: Secondary | ICD-10-CM | POA: Diagnosis not present

## 2014-06-19 DIAGNOSIS — W19XXXA Unspecified fall, initial encounter: Secondary | ICD-10-CM

## 2014-06-19 MED ORDER — FENTANYL CITRATE 0.05 MG/ML IJ SOLN: 25.0000 ug | Freq: Once | INTRAMUSCULAR | Status: DC

## 2014-06-19 MED ORDER — LIDOCAINE-EPINEPHRINE 1 %-1:100000 IJ SOLN
10.0000 mL | Freq: Once | INTRAMUSCULAR | Status: AC
  Administered 2014-06-19: 10 mL via INTRADERMAL
  Filled 2014-06-19: qty 1

## 2014-06-19 MED ORDER — HYDROCODONE-ACETAMINOPHEN 5-325 MG PO TABS
1.0000 | ORAL_TABLET | Freq: Once | ORAL | Status: AC
  Administered 2014-06-19: 1 via ORAL
  Filled 2014-06-19: qty 1

## 2014-06-19 NOTE — ED Provider Notes (Signed)
CSN: [id number]     Arrival date & time 06/19/14  1738 History   First MD Initiated Contact with Patient 06/19/14 1746     Chief Complaint  Patient presents with  . Fall     (Consider location/radiation/quality/duration/timing/severity/associated sxs/prior Treatment) HPI Comments: Pt in normal state of health when she tripped and fell in driveway landing on her left side/shoulder nad hitting head.  Denies syncope, neurologic symptoms  Patient is a 81 y.o. female presenting with fall.  Fall This is a new problem. The current episode started today. The problem has been unchanged. Associated symptoms include arthralgias, headaches and weakness (cannot lift left arm at shoulder). Pertinent negatives include no abdominal pain, chest pain, congestion, coughing, diaphoresis, fatigue, fever, nausea, neck pain, numbness, rash, sore throat, urinary symptoms, visual change or vomiting. Nothing aggravates the symptoms. She has tried nothing for the symptoms. The treatment provided no relief.    Past Medical History  Diagnosis Date  . ALLERGIC RHINITIS 05/29/2007  . CHRONIC OBSTRUCTIVE PULMONARY DISEASE, ACUTE EXACERBATION 11/21/2007  . COLONIC POLYPS, HX OF 11/21/2007  . COPD 11/21/2007  . DIVERTICULOSIS, COLON 11/21/2007  . HIP PAIN, LEFT, CHRONIC 06/01/2010  . HYPERLIPIDEMIA 05/29/2007  . HYPERTENSION 05/29/2007  . Hypopotassemia 06/12/2007  . HYSTERECTOMY, HX OF 05/29/2007  . Other acquired absence of organ 05/29/2007  . Other postprocedural status(V45.89) 05/29/2007  . RADICULOPATHY 05/29/2007  . REACTIVE AIRWAY DISEASE 05/29/2007   Past Surgical History  Procedure Laterality Date  . Lumbar laminectomy    . Lipoma excision    . Dilation and curettage of uterus    . Appendectomy    . Abdominal hysterectomy    . Tonsillectomy     Family History  Problem Relation Age of Onset  . Arthritis Mother   . Heart disease Mother   . Uterine cancer Daughter   . Cancer Daughter     ovarian  .  Heart disease Father   . Diabetes Neg Hx   . COPD Neg Hx   . Stroke Neg Hx   . Kidney disease Neg Hx    History  Substance Use Topics  . Smoking status: Former Smoker    Quit date: 06/14/1980  . Smokeless tobacco: Never Used  . Alcohol Use: No   OB History    No data available     Review of Systems  Constitutional: Negative for fever, diaphoresis and fatigue.  HENT: Negative for congestion and sore throat.   Eyes: Negative for visual disturbance.  Respiratory: Negative for cough and shortness of breath.   Cardiovascular: Negative for chest pain.  Gastrointestinal: Negative for nausea, vomiting and abdominal pain.  Genitourinary: Negative for difficulty urinating.  Musculoskeletal: Positive for arthralgias. Negative for back pain and neck pain.  Skin: Positive for wound. Negative for rash.  Neurological: Positive for weakness (cannot lift left arm at shoulder) and headaches. Negative for syncope, facial asymmetry, light-headedness and numbness.      Allergies  Codeine  Home Medications   Prior to Admission medications   Medication Sig Start Date End Date Taking? Authorizing Provider  albuterol (VENTOLIN HFA) 108 (90 BASE) MCG/ACT inhaler Inhale 2 puffs into the lungs every 4 (four) hours as needed. 06/18/12   Neena Rhymes, MD  amLODipine (NORVASC) 10 MG tablet Take 1 tablet (10 mg total) by mouth daily. 07/17/13   Neena Rhymes, MD  Multiple Vitamins-Minerals (CENTRUM SILVER) tablet Take 1 tablet by mouth daily.    Historical Provider, MD  triamterene-hydrochlorothiazide (MAXZIDE) 75-50 MG  per tablet Take 1 tablet by mouth daily. 07/17/13   Neena Rhymes, MD   BP 144/63 mmHg  Pulse 84  Temp(Src) 97.4 F (36.3 C) (Oral)  Resp 15  Ht 5\' 7"  (1.702 m)  Wt 145 lb (65.772 kg)  BMI 22.71 kg/m2  SpO2 100% Physical Exam  Constitutional: She is oriented to person, place, and time. She appears well-developed and well-nourished. No distress.  HENT:  Head: Normocephalic.  Head is with abrasion (left forehead), with contusion (left eye) and with laceration (2cm left forehead, .5-1cm central forehead).  Mouth/Throat: Oropharynx is clear and moist. No oropharyngeal exudate.  Eyes: Conjunctivae and EOM are normal. Pupils are equal, round, and reactive to light.  Neck: Normal range of motion.  Cardiovascular: Normal rate, regular rhythm, normal heart sounds and intact distal pulses.  Exam reveals no gallop and no friction rub.   No murmur heard. Pulmonary/Chest: Effort normal and breath sounds normal. No respiratory distress. She has no wheezes. She has no rales.  Abdominal: Soft. She exhibits no distension. There is no tenderness. There is no guarding.  Musculoskeletal: She exhibits no edema.       Left shoulder: She exhibits decreased range of motion, tenderness, bony tenderness, pain and decreased strength. She exhibits no laceration and normal pulse.       Left elbow: She exhibits normal range of motion and no deformity.       Left wrist: She exhibits normal range of motion and no tenderness.       Right hip: She exhibits no bony tenderness.       Left hip: She exhibits no bony tenderness.       Right knee: She exhibits ecchymosis. She exhibits normal range of motion.       Left knee: She exhibits ecchymosis. She exhibits normal range of motion.       Cervical back: She exhibits no bony tenderness.       Thoracic back: She exhibits no bony tenderness.       Lumbar back: She exhibits no bony tenderness.  Neurological: She is alert and oriented to person, place, and time. She has normal strength. No cranial nerve deficit or sensory deficit. Coordination (coordination normal, assists left arm flexion to perform finger to nose) normal. GCS eye subscore is 4. GCS verbal subscore is 5. GCS motor subscore is 6.  Weakness left shoulder abduction and flexion No other weakness of arms and legs  Skin: Skin is warm and dry. No rash noted. She is not diaphoretic. No erythema.   Nursing note and vitals reviewed.   ED Course  LACERATION REPAIR Date/Time: 06/20/2014 3:00 AM Performed by: Alvino Chapel Authorized by: Alvino Chapel Consent: Verbal consent obtained. Risks and benefits: risks, benefits and alternatives were discussed Required items: required blood products, implants, devices, and special equipment available Patient identity confirmed: verbally with patient Time out: Immediately prior to procedure a "time out" was called to verify the correct patient, procedure, equipment, support staff and site/side marked as required. Body area: head/neck Location details: forehead Laceration length: 2 (and 1cm) cm Tendon involvement: none Nerve involvement: none Vascular damage: no Anesthesia: local infiltration Local anesthetic: lidocaine 1% with epinephrine Anesthetic total: 5 ml Patient sedated: no Irrigation solution: saline Irrigation method: syringe (with zerowet) Amount of cleaning: standard Debridement: none Degree of undermining: none Skin closure: 6-0 Prolene Number of sutures: 4 Technique: simple Approximation: close Approximation difficulty: simple Dressing: antibiotic ointment and 4x4 sterile gauze Patient tolerance: Patient tolerated the  procedure well with no immediate complications   (including critical care time) Labs Review Labs Reviewed - No data to display  Imaging Review No results found.   EKG Interpretation None      MDM   Final diagnoses:  Fall   81 year old female with a history of hypertension, hyperlipidemia, COPD, diverticulosis presents with concern for fall with headache and left shoulder pain. No anticoagulant use.  Fall was mechanical and she denies any loss of consciousness. She is in a normal state of health.  CT of the head and cervical spine were obtained that showed no evidence of intracranial bleed or fracture. X-ray of the left shoulder was obtained given left shoulder pain and  decreased range of motion and showed only degenerative findings.  On exam patient is unable to abduct her left arm, however does not have any other neurologic changes and have low suspicion for intracranial or intraspinal cause of symptoms. Give given significant shoulder pain as well as weakness of the left shoulder would consider musculoskeletal or ligamentous cause of her symptoms (i.e. Rotator cuff tear). Patient has orthopedic doctor who she will follow up with closely as an outpatient.  Lacerations were closed using 6-0 Prolene (4 sutures) and recommended removal 5 days.  Patient given Norco for pain while in the emergency department. She was discharged in stable condition with understanding of reasons to return and will follow up with her primary care physician as well as orthopedics.  Alvino Chapel, MD 06/20/14 6004  Quintella Reichert, MD 06/23/14 [phone]

## 2014-06-19 NOTE — ED Notes (Signed)
Patient transported to X-ray 

## 2014-06-19 NOTE — ED Notes (Signed)
Per ems-- pt tripped and fell in driveway. Pt with hematoma to L side of forehead. Pt c/o pain to L shoulder and arm. Denies loc. No blood thinners. No deformity noted-pms intact.

## 2014-06-25 DIAGNOSIS — M12812 Other specific arthropathies, not elsewhere classified, left shoulder: Secondary | ICD-10-CM | POA: Diagnosis not present

## 2014-07-07 DIAGNOSIS — S46012D Strain of muscle(s) and tendon(s) of the rotator cuff of left shoulder, subsequent encounter: Secondary | ICD-10-CM | POA: Diagnosis not present

## 2014-07-10 DIAGNOSIS — S46012D Strain of muscle(s) and tendon(s) of the rotator cuff of left shoulder, subsequent encounter: Secondary | ICD-10-CM | POA: Diagnosis not present

## 2014-07-15 DIAGNOSIS — S46012D Strain of muscle(s) and tendon(s) of the rotator cuff of left shoulder, subsequent encounter: Secondary | ICD-10-CM | POA: Diagnosis not present

## 2014-07-17 DIAGNOSIS — S46012D Strain of muscle(s) and tendon(s) of the rotator cuff of left shoulder, subsequent encounter: Secondary | ICD-10-CM | POA: Diagnosis not present

## 2014-07-22 ENCOUNTER — Ambulatory Visit (INDEPENDENT_AMBULATORY_CARE_PROVIDER_SITE_OTHER): Payer: Medicare Other | Admitting: Internal Medicine

## 2014-07-22 ENCOUNTER — Encounter: Payer: Self-pay | Admitting: Internal Medicine

## 2014-07-22 ENCOUNTER — Other Ambulatory Visit (INDEPENDENT_AMBULATORY_CARE_PROVIDER_SITE_OTHER): Payer: Medicare Other

## 2014-07-22 VITALS — BP 138/70 | HR 100 | Temp 98.3°F | Resp 12 | Ht 64.0 in | Wt 141.8 lb

## 2014-07-22 DIAGNOSIS — I10 Essential (primary) hypertension: Secondary | ICD-10-CM

## 2014-07-22 DIAGNOSIS — Z0000 Encounter for general adult medical examination without abnormal findings: Secondary | ICD-10-CM

## 2014-07-22 DIAGNOSIS — E785 Hyperlipidemia, unspecified: Secondary | ICD-10-CM

## 2014-07-22 DIAGNOSIS — J449 Chronic obstructive pulmonary disease, unspecified: Secondary | ICD-10-CM

## 2014-07-22 LAB — BASIC METABOLIC PANEL
BUN: 28 mg/dL — AB (ref 6–23)
CHLORIDE: 99 meq/L (ref 96–112)
CO2: 31 meq/L (ref 19–32)
Calcium: 10.6 mg/dL — ABNORMAL HIGH (ref 8.4–10.5)
Creatinine, Ser: 1.29 mg/dL — ABNORMAL HIGH (ref 0.40–1.20)
GFR: 41.7 mL/min — ABNORMAL LOW (ref >60.00)
GLUCOSE: 104 mg/dL — AB (ref 70–99)
POTASSIUM: 3.2 meq/L — AB (ref 3.5–5.1)
Sodium: 139 mEq/L (ref 135–145)

## 2014-07-22 LAB — LIPID PANEL
CHOLESTEROL: 233 mg/dL — AB (ref 0–200)
HDL: 50.4 mg/dL (ref >39.00)
NONHDL: 182.6
Total CHOL/HDL Ratio: 5
Triglycerides: 376 mg/dL — ABNORMAL HIGH (ref 0.0–149.0)
VLDL: 75.2 mg/dL — AB (ref 0.0–40.0)

## 2014-07-22 LAB — LDL CHOLESTEROL, DIRECT: Direct LDL: 141 mg/dL

## 2014-07-22 MED ORDER — TRIAMTERENE-HCTZ 75-50 MG PO TABS: 1.0000 | ORAL_TABLET | Freq: Every day | ORAL | Status: DC

## 2014-07-22 MED ORDER — AMLODIPINE BESYLATE 10 MG PO TABS: 10.0000 mg | ORAL_TABLET | Freq: Every day | ORAL | Status: DC

## 2014-07-22 MED ORDER — KETOCONAZOLE 2 % EX GEL: CUTANEOUS | Status: DC

## 2014-07-22 NOTE — Patient Instructions (Addendum)
We will send in some ketoconazole gel for the rash. You can use it twice daily until it is gone. Once the rash is gone, you can use cornstarch or baby powder to help keep the area dry and from rubbing and getting the rash back.  We have sent in 90 day supply of your medicines. We will check your blood work today and call you back with the results.   We will see you back next year for your physical. If you have any problems or questions sooner please feel free to call the office.   Fall Prevention and Home Safety Falls cause injuries and can affect all age groups. It is possible to use preventive measures to significantly decrease the likelihood of falls. There are many simple measures which can make your home safer and prevent falls. OUTDOORS  Repair cracks and edges of walkways and driveways.  Remove high doorway thresholds.  Trim shrubbery on the main path into your home.  Have good outside lighting.  Clear walkways of tools, rocks, debris, and clutter.  Check that handrails are not broken and are securely fastened. Both sides of steps should have handrails.  Have leaves, snow, and ice cleared regularly.  Use sand or salt on walkways during winter months.  In the garage, clean up grease or oil spills. BATHROOM  Install night lights.  Install grab bars by the toilet and in the tub and shower.  Use non-skid mats or decals in the tub or shower.  Place a plastic non-slip stool in the shower to sit on, if needed.  Keep floors dry and clean up all water on the floor immediately.  Remove soap buildup in the tub or shower on a regular basis.  Secure bath mats with non-slip, double-sided rug tape.  Remove throw rugs and tripping hazards from the floors. BEDROOMS  Install night lights.  Make sure a bedside light is easy to reach.  Do not use oversized bedding.  Keep a telephone by your bedside.  Have a firm chair with side arms to use for getting dressed.  Remove throw  rugs and tripping hazards from the floor. KITCHEN  Keep handles on pots and pans turned toward the center of the stove. Use back burners when possible.  Clean up spills quickly and allow time for drying.  Avoid walking on wet floors.  Avoid hot utensils and knives.  Position shelves so they are not too high or low.  Place commonly used objects within easy reach.  If necessary, use a sturdy step stool with a grab bar when reaching.  Keep electrical cables out of the way.  Do not use floor polish or wax that makes floors slippery. If you must use wax, use non-skid floor wax.  Remove throw rugs and tripping hazards from the floor. STAIRWAYS  Never leave objects on stairs.  Place handrails on both sides of stairways and use them. Fix any loose handrails. Make sure handrails on both sides of the stairways are as long as the stairs.  Check carpeting to make sure it is firmly attached along stairs. Make repairs to worn or loose carpet promptly.  Avoid placing throw rugs at the top or bottom of stairways, or properly secure the rug with carpet tape to prevent slippage. Get rid of throw rugs, if possible.  Have an electrician put in a light switch at the top and bottom of the stairs. OTHER FALL PREVENTION TIPS  Wear low-heel or rubber-soled shoes that are supportive and fit  well. Wear closed toe shoes.  When using a stepladder, make sure it is fully opened and both spreaders are firmly locked. Do not climb a closed stepladder.  Add color or contrast paint or tape to grab bars and handrails in your home. Place contrasting color strips on first and last steps.  Learn and use mobility aids as needed. Install an electrical emergency response system.  Turn on lights to avoid dark areas. Replace light bulbs that burn out immediately. Get light switches that glow.  Arrange furniture to create clear pathways. Keep furniture in the same place.  Firmly attach carpet with non-skid or  double-sided tape.  Eliminate uneven floor surfaces.  Select a carpet pattern that does not visually hide the edge of steps.  Be aware of all pets. OTHER HOME SAFETY TIPS  Set the water temperature for 120 F (48.8 C).  Keep emergency numbers on or near the telephone.  Keep smoke detectors on every level of the home and near sleeping areas. Document Released: 05/20/2002 Document Revised: 11/29/2011 Document Reviewed: 08/19/2011 [facility] Patient Information 2015 [place], [place]. This information is not intended to replace advice given to you by your health care provider. Make sure you discuss any questions you have with your health care provider.

## 2014-07-22 NOTE — Progress Notes (Signed)
Pre visit review using our clinic review tool, if applicable. No additional management support is needed unless otherwise documented below in the visit note. 

## 2014-07-23 ENCOUNTER — Encounter: Payer: Self-pay | Admitting: Internal Medicine

## 2014-07-23 ENCOUNTER — Telehealth: Payer: Self-pay | Admitting: Internal Medicine

## 2014-07-23 NOTE — Progress Notes (Signed)
   Subjective:    Patient ID: [name], female    DOB: 1927-04-21, 81 y.o.   MRN: [id number]  HPI The patient is an 81 YO female who comes in for wellness. She did have a fall about 1 month ago on ice. She is doing well and denies LOC with the fall. She did get stitches and small scab left where it happened. Has not fallen since. She does have a rash on her stomach from where the folds rub on each other, especially worse when she sweats. No other changes to her health.  Diet: heart healthy Physical activity: sedentary Depression/mood screen: negative Hearing: intact to whispered voice Visual acuity: grossly normal, performs annual eye exam  ADLs: capable Fall risk: low Home safety: good Cognitive evaluation: intact to orientation, naming, recall and repetition EOL planning: adv directives, No mechanical ventilation/ I agree  I have personally reviewed and have noted 1. The patient's medical and social history 2. Their use of alcohol, tobacco or illicit drugs 3. Their current medications and supplements 4. The patient's functional ability including ADL's, fall risks, home safety risks and hearing or visual impairment. 5. Diet and physical activities 6. Evidence for depression or mood disorders  Review of Systems  Constitutional: Negative for fever, activity change, appetite change, fatigue and unexpected weight change.  HENT: Negative.   Eyes: Negative.   Respiratory: Negative for cough, chest tightness, shortness of breath and wheezing.   Cardiovascular: Negative for chest pain, palpitations and leg swelling.  Gastrointestinal: Negative for abdominal pain, diarrhea, constipation and abdominal distention.  Musculoskeletal: Negative.   Skin: Negative.   Neurological: Negative.   Psychiatric/Behavioral: Negative.       Objective:   Physical Exam  Constitutional: She is oriented to person, place, and time. She appears well-developed and well-nourished.  HENT:  Head:  Normocephalic and atraumatic.  Scab above the left eyelid, healing  Eyes: EOM are normal.  Neck: Normal range of motion.  Cardiovascular: Normal rate and regular rhythm.   Pulmonary/Chest: Effort normal and breath sounds normal. No respiratory distress. She has no wheezes. She has no rales.  Abdominal: Soft. Bowel sounds are normal.  Musculoskeletal: She exhibits no edema.  Neurological: She is alert and oriented to person, place, and time. Coordination normal.  Skin: Skin is warm and dry.   Filed Vitals:   07/22/14 1359 07/22/14 1448  BP: 168/76 138/70  Pulse: 100   Temp: 98.3 F (36.8 C)   TempSrc: Oral   Resp: 12   Height: 5\' 4"  (1.626 m)   Weight: 141 lb 12.8 oz (64.32 kg)   SpO2: 96%       Assessment & Plan:

## 2014-07-23 NOTE — Assessment & Plan Note (Addendum)
No further colon cancer screening recommended. No symptoms. Does not want mammogram. Declines any immunizations today but due for shingles, pnevar 13, flu. Up to date on pneumonia 23, tetanus. Treat yeast infection on skin of stomach.

## 2014-07-23 NOTE — Assessment & Plan Note (Signed)
Checking lipid panel today, not on medication.

## 2014-07-23 NOTE — Telephone Encounter (Signed)
Pt called in said that the gel that was called in was 180.00 and pharmacist said that if the cream was called in, it would only be around $20.00.  Can we call in the cream?

## 2014-07-23 NOTE — Assessment & Plan Note (Signed)
Has albuterol at home, does not use it hardly at all.

## 2014-07-23 NOTE — Assessment & Plan Note (Signed)
BP well controlled today. Check BMP for electrolyte and kidney function.

## 2014-07-24 DIAGNOSIS — S46012D Strain of muscle(s) and tendon(s) of the rotator cuff of left shoulder, subsequent encounter: Secondary | ICD-10-CM | POA: Diagnosis not present

## 2014-07-24 MED ORDER — KETOCONAZOLE 2 % EX CREA: 1.0000 "application " | TOPICAL_CREAM | Freq: Every day | CUTANEOUS | Status: DC

## 2014-07-24 NOTE — Telephone Encounter (Signed)
Patient is aware that cream has been sent in and will go pick it up.

## 2014-07-24 NOTE — Telephone Encounter (Signed)
Sent in cream

## 2014-07-24 NOTE — Telephone Encounter (Signed)
Please advise, thanks.

## 2014-07-25 DIAGNOSIS — M12812 Other specific arthropathies, not elsewhere classified, left shoulder: Secondary | ICD-10-CM | POA: Diagnosis not present

## 2014-08-02 ENCOUNTER — Encounter: Payer: Self-pay | Admitting: Gastroenterology

## 2014-09-01 ENCOUNTER — Ambulatory Visit (INDEPENDENT_AMBULATORY_CARE_PROVIDER_SITE_OTHER): Payer: Medicare Other | Admitting: Podiatry

## 2014-09-01 ENCOUNTER — Encounter: Payer: Self-pay | Admitting: Podiatry

## 2014-09-01 DIAGNOSIS — B351 Tinea unguium: Secondary | ICD-10-CM | POA: Diagnosis not present

## 2014-09-01 DIAGNOSIS — M79673 Pain in unspecified foot: Secondary | ICD-10-CM | POA: Diagnosis not present

## 2014-09-01 NOTE — Progress Notes (Signed)
Subjective:     Patient ID: THEIA DEZEEUW, female   DOB: September 14, 1926, 81 y.o.   MRN: [id number]  HPI thick nails 1-5 both feet that are painful   Review of Systems     Objective:   Physical Exam Yellow brittle nailbeds 1-5 both feet that are thick yellow and painful when pressed with neurovascular status found to be intact with no changes    Assessment:     Mycotic nail infections with pain 1-5 both feet    Plan:     Debride painful nailbeds 1-5 both feet with no iatrogenic bleeding noted

## 2014-09-01 NOTE — Progress Notes (Signed)
Subjective:     Patient ID: [name], female   DOB: 23-Nov-1926, 81 y.o.   MRN: [id number]  HPI patient states my nails are thick and I cannot cut them on both feet   Review of Systems     Objective:   Physical Exam Neurovascular status intact with thick yellow brittle nailbeds 1-5 both feet that are painful    Assessment:     Mycotic nail infection is with pain 1-5 both feet    Plan:     Debride painful nailbeds 1-5 both feet with no iatrogenic bleeding noted

## 2014-12-01 ENCOUNTER — Ambulatory Visit: Payer: Medicare Other

## 2014-12-11 ENCOUNTER — Ambulatory Visit (INDEPENDENT_AMBULATORY_CARE_PROVIDER_SITE_OTHER): Payer: Medicare Other | Admitting: Podiatry

## 2014-12-11 ENCOUNTER — Encounter: Payer: Self-pay | Admitting: Podiatry

## 2014-12-11 DIAGNOSIS — M79673 Pain in unspecified foot: Secondary | ICD-10-CM

## 2014-12-11 DIAGNOSIS — B351 Tinea unguium: Secondary | ICD-10-CM | POA: Diagnosis not present

## 2014-12-11 NOTE — Progress Notes (Signed)
Patient ID: [name], female   DOB: June 27, 1926, 81 y.o.   MRN: [id number] Complaint:  Visit Type: Patient returns to my office for continued preventative foot care services. Complaint: Patient states" my nails have grown long and thick and become painful to walk and wear shoes" . She presents for preventative foot care services. No changes to ROS  Podiatric Exam: Vascular: dorsalis pedis and posterior tibial pulses are palpable bilateral. Capillary return is immediate. Temperature gradient is WNL. Skin turgor WNL  Sensorium: Normal Semmes Weinstein monofilament test. Normal tactile sensation bilaterally. Nail Exam: Pt has thick disfigured discolored nails with subungual debris noted bilateral entire nail hallux through fifth toenails Ulcer Exam: There is no evidence of ulcer or pre-ulcerative changes or infection. Orthopedic Exam: Muscle tone and strength are WNL. No limitations in general ROM. No crepitus or effusions noted. Foot type and digits show no abnormalities. Bony prominences are unremarkable. Skin: No Porokeratosis. No infection or ulcers  Diagnosis:  Tinea unguium, Pain in right toe, pain in left toes  Treatment & Plan Procedures and Treatment: Consent by patient was obtained for treatment procedures. The patient understood the discussion of treatment and procedures well. All questions were answered thoroughly reviewed. Debridement of mycotic and hypertrophic toenails, 1 through 5 bilateral and clearing of subungual debris. No ulceration, no infection noted.  Return Visit-Office Procedure: Patient instructed to return to the office for a follow up visit 3 months for continued evaluation and treatment.

## 2015-03-24 ENCOUNTER — Encounter: Payer: Self-pay | Admitting: Podiatry

## 2015-03-24 ENCOUNTER — Ambulatory Visit (INDEPENDENT_AMBULATORY_CARE_PROVIDER_SITE_OTHER): Payer: Medicare Other | Admitting: Podiatry

## 2015-03-24 DIAGNOSIS — B351 Tinea unguium: Secondary | ICD-10-CM

## 2015-03-24 DIAGNOSIS — M79673 Pain in unspecified foot: Secondary | ICD-10-CM

## 2015-03-24 NOTE — Progress Notes (Signed)
Patient ID: [name], female   DOB: 1926-09-24, 81 y.o.   MRN: [id number]  Subjective: 81 y.o. returns the office today for painful, elongated, thickened toenails which she is unable to trim herself. Denies any redness or drainage around the nails. Denies any acute changes since last appointment and no new complaints today. Denies any systemic complaints such as fevers, chills, nausea, vomiting.   Objective: AAO 3, NAD DP/PT pulses palpable, CRT less than 3 seconds Nails hypertrophic, dystrophic, elongated, brittle, discolored 10. There is tenderness overlying the nails 1-5 bilaterally. There is no surrounding erythema or drainage along the nail sites. No open lesions or pre-ulcerative lesions are identified. No other areas of tenderness bilateral lower extremities. No overlying edema, erythema, increased warmth. No pain with calf compression, swelling, warmth, erythema.  Assessment: Patient presents with symptomatic onychomycosis  Plan: -Treatment options including alternatives, risks, complications were discussed -Nails sharply debrided 10 without complication/bleeding. -Discussed daily foot inspection. If there are any changes, to call the office immediately.  -Follow-up in 3 months or sooner if any problems are to arise. In the meantime, encouraged to call the office with any questions, concerns, changes symptoms.  Celesta Gentile, DPM

## 2015-04-13 DIAGNOSIS — H11423 Conjunctival edema, bilateral: Secondary | ICD-10-CM | POA: Diagnosis not present

## 2015-04-13 DIAGNOSIS — H25043 Posterior subcapsular polar age-related cataract, bilateral: Secondary | ICD-10-CM | POA: Diagnosis not present

## 2015-04-13 DIAGNOSIS — H11153 Pinguecula, bilateral: Secondary | ICD-10-CM | POA: Diagnosis not present

## 2015-04-13 DIAGNOSIS — H2513 Age-related nuclear cataract, bilateral: Secondary | ICD-10-CM | POA: Diagnosis not present

## 2015-04-13 DIAGNOSIS — H52223 Regular astigmatism, bilateral: Secondary | ICD-10-CM | POA: Diagnosis not present

## 2015-04-13 DIAGNOSIS — H04123 Dry eye syndrome of bilateral lacrimal glands: Secondary | ICD-10-CM | POA: Diagnosis not present

## 2015-04-13 DIAGNOSIS — H5203 Hypermetropia, bilateral: Secondary | ICD-10-CM | POA: Diagnosis not present

## 2015-04-13 DIAGNOSIS — H18413 Arcus senilis, bilateral: Secondary | ICD-10-CM | POA: Diagnosis not present

## 2015-04-13 DIAGNOSIS — H25013 Cortical age-related cataract, bilateral: Secondary | ICD-10-CM | POA: Diagnosis not present

## 2015-04-22 DIAGNOSIS — H1859 Other hereditary corneal dystrophies: Secondary | ICD-10-CM | POA: Diagnosis not present

## 2015-04-22 DIAGNOSIS — H353131 Nonexudative age-related macular degeneration, bilateral, early dry stage: Secondary | ICD-10-CM | POA: Diagnosis not present

## 2015-04-22 DIAGNOSIS — H25813 Combined forms of age-related cataract, bilateral: Secondary | ICD-10-CM | POA: Diagnosis not present

## 2015-05-14 HISTORY — PX: CATARACT EXTRACTION W/ INTRAOCULAR LENS IMPLANT: SHX1309

## 2015-05-21 DIAGNOSIS — H268 Other specified cataract: Secondary | ICD-10-CM | POA: Diagnosis not present

## 2015-05-21 DIAGNOSIS — H2512 Age-related nuclear cataract, left eye: Secondary | ICD-10-CM | POA: Diagnosis not present

## 2015-06-09 DIAGNOSIS — H2511 Age-related nuclear cataract, right eye: Secondary | ICD-10-CM | POA: Diagnosis not present

## 2015-06-11 DIAGNOSIS — H2511 Age-related nuclear cataract, right eye: Secondary | ICD-10-CM | POA: Diagnosis not present

## 2015-06-11 DIAGNOSIS — H268 Other specified cataract: Secondary | ICD-10-CM | POA: Diagnosis not present

## 2015-06-29 ENCOUNTER — Ambulatory Visit (INDEPENDENT_AMBULATORY_CARE_PROVIDER_SITE_OTHER): Payer: Medicare Other | Admitting: Sports Medicine

## 2015-06-29 ENCOUNTER — Encounter: Payer: Self-pay | Admitting: Sports Medicine

## 2015-06-29 DIAGNOSIS — B351 Tinea unguium: Secondary | ICD-10-CM

## 2015-06-29 DIAGNOSIS — M79673 Pain in unspecified foot: Secondary | ICD-10-CM

## 2015-06-29 NOTE — Progress Notes (Signed)
Patient ID: [name], female   DOB: [birth year], 82 y.o.   MRN: [id number] Subjective: [name] is a 82 y.o. female patient seen today in office with complaint of painful thickened and elongated toenails; unable to trim. Patient denies history of known Diabetes, Neuropathy, or Vascular disease. Patient has no other pedal complaints at this time.   Patient Active Problem List   Diagnosis Date Noted  . Advanced care planning/counseling discussion 07/17/2013  . Chronic low back pain 06/18/2012  . Routine health maintenance 06/18/2011  . COPD, mild ([place]) 11/21/2007  . Hyperlipidemia 05/29/2007  . Essential hypertension 05/29/2007  . ALLERGIC RHINITIS 05/29/2007   Current Outpatient Prescriptions on File Prior to Visit  Medication Sig Dispense Refill  . albuterol (VENTOLIN HFA) 108 (90 BASE) MCG/ACT inhaler Inhale 2 puffs into the lungs every 4 (four) hours as needed. 1 Inhaler 5  . amLODipine (NORVASC) 10 MG tablet Take 1 tablet (10 mg total) by mouth daily. 90 tablet 3  . ketoconazole (NIZORAL) 2 % cream Apply 1 application topically daily. 30 g 0  . Multiple Vitamins-Minerals (CENTRUM SILVER) tablet Take 1 tablet by mouth daily.    Marland Kitchen triamterene-hydrochlorothiazide (MAXZIDE) 75-50 MG per tablet Take 1 tablet by mouth daily. 90 tablet 3   No current facility-administered medications on file prior to visit.   Allergies  Allergen Reactions  . Codeine     Objective: Physical Exam  General: Well developed, nourished, no acute distress, awake, alert and oriented x 3  Vascular: Dorsalis pedis artery 1/4 bilateral, Posterior tibial artery 1/4 bilateral, skin temperature warm to warm proximal to distal bilateral lower extremities, mild varicosities, pedal hair present bilateral.  Neurological: Gross sensation present via light touch bilateral.   Dermatological: Skin is warm, dry, and supple bilateral, Nails 1-10 are tender, long, thick, and discolored with mild subungal  debris, no webspace macerations present bilateral, no open lesions present bilateral, no callus/corns/hyperkeratotic tissue present bilateral. No signs of infection bilateral.  Musculoskeletal: No boney deformities noted bilateral. Muscular strength within normal limits without pain on range of motion. No pain with calf compression bilateral.  Assessment and Plan:  Problem List Items Addressed This Visit    None    Visit Diagnoses    Foot pain, unspecified laterality    -  Primary    Dermatophytosis of nail          -Examined patient.  -Discussed treatment options for painful mycotic nails. -Mechanically debrided and reduced mycotic nails with sterile nail nipper and dremel nail file without incident. -Patient to return in 3 months for follow up evaluation or sooner if symptoms worsen.  Landis Martins, DPM

## 2015-07-23 ENCOUNTER — Telehealth: Payer: Self-pay

## 2015-07-23 NOTE — Telephone Encounter (Signed)
Call to request she come in early for AWV; Agreed to come in at 1pm tomorrow. Apt with Dr. Sharlet Salina at 1;30

## 2015-07-24 ENCOUNTER — Other Ambulatory Visit (INDEPENDENT_AMBULATORY_CARE_PROVIDER_SITE_OTHER): Payer: Medicare Other

## 2015-07-24 ENCOUNTER — Encounter: Payer: Self-pay | Admitting: Internal Medicine

## 2015-07-24 ENCOUNTER — Ambulatory Visit (INDEPENDENT_AMBULATORY_CARE_PROVIDER_SITE_OTHER): Payer: Medicare Other | Admitting: Internal Medicine

## 2015-07-24 ENCOUNTER — Ambulatory Visit (INDEPENDENT_AMBULATORY_CARE_PROVIDER_SITE_OTHER)
Admission: RE | Admit: 2015-07-24 | Discharge: 2015-07-24 | Disposition: A | Discharge status: Home / Self Care | Payer: Medicare Other | Source: Ambulatory Visit | Attending: Internal Medicine | Admitting: Internal Medicine

## 2015-07-24 VITALS — BP 150/70 | HR 100 | Temp 97.9°F | Ht 64.0 in | Wt 128.5 lb

## 2015-07-24 DIAGNOSIS — Z0000 Encounter for general adult medical examination without abnormal findings: Secondary | ICD-10-CM | POA: Diagnosis not present

## 2015-07-24 DIAGNOSIS — E781 Pure hyperglyceridemia: Secondary | ICD-10-CM

## 2015-07-24 DIAGNOSIS — R208 Other disturbances of skin sensation: Secondary | ICD-10-CM | POA: Diagnosis not present

## 2015-07-24 DIAGNOSIS — R200 Anesthesia of skin: Secondary | ICD-10-CM

## 2015-07-24 DIAGNOSIS — I10 Essential (primary) hypertension: Secondary | ICD-10-CM | POA: Diagnosis not present

## 2015-07-24 DIAGNOSIS — M47812 Spondylosis without myelopathy or radiculopathy, cervical region: Secondary | ICD-10-CM | POA: Diagnosis not present

## 2015-07-24 DIAGNOSIS — G5601 Carpal tunnel syndrome, right upper limb: Secondary | ICD-10-CM

## 2015-07-24 DIAGNOSIS — Z23 Encounter for immunization: Secondary | ICD-10-CM

## 2015-07-24 LAB — LIPID PANEL
CHOLESTEROL: 242 mg/dL — AB (ref 0–200)
HDL: 56.7 mg/dL (ref >39.00)
NonHDL: 185.09
Total CHOL/HDL Ratio: 4
Triglycerides: 309 mg/dL — ABNORMAL HIGH (ref 0.0–149.0)
VLDL: 61.8 mg/dL — ABNORMAL HIGH (ref 0.0–40.0)

## 2015-07-24 LAB — COMPREHENSIVE METABOLIC PANEL
ALBUMIN: 4.4 g/dL (ref 3.5–5.2)
ALT: 17 U/L (ref 0–35)
AST: 27 U/L (ref 0–37)
Alkaline Phosphatase: 78 U/L (ref 39–117)
BILIRUBIN TOTAL: 0.6 mg/dL (ref 0.2–1.2)
BUN: 23 mg/dL (ref 6–23)
CO2: 33 mEq/L — ABNORMAL HIGH (ref 19–32)
Calcium: 10.9 mg/dL — ABNORMAL HIGH (ref 8.4–10.5)
Chloride: 99 mEq/L (ref 96–112)
Creatinine, Ser: 1.03 mg/dL (ref 0.40–1.20)
GFR: 53.95 mL/min — ABNORMAL LOW (ref >60.00)
Glucose, Bld: 100 mg/dL — ABNORMAL HIGH (ref 70–99)
POTASSIUM: 3.5 meq/L (ref 3.5–5.1)
SODIUM: 141 meq/L (ref 135–145)
TOTAL PROTEIN: 8.1 g/dL (ref 6.0–8.3)

## 2015-07-24 LAB — LDL CHOLESTEROL, DIRECT: LDL DIRECT: 141 mg/dL

## 2015-07-24 MED ORDER — TRIAMTERENE-HCTZ 37.5-25 MG PO TABS: 1.0000 | ORAL_TABLET | Freq: Every day | ORAL | Status: DC

## 2015-07-24 NOTE — Patient Instructions (Addendum)
Health Maintenance, Female Adopting a healthy lifestyle and getting preventive care can go a long way to promote health and wellness. Talk with your health care provider about what schedule of regular examinations is right for you. This is a good chance for you to check in with your provider about disease prevention and staying healthy. In between checkups, there are plenty of things you can do on your own. Experts have done a lot of research about which lifestyle changes and preventive measures are most likely to keep you healthy. Ask your health care provider for more information. WEIGHT AND DIET  Eat a healthy diet  Be sure to include plenty of vegetables, fruits, low-fat dairy products, and lean protein.  Do not eat a lot of foods high in solid fats, added sugars, or salt.  Get regular exercise. This is one of the most important things you can do for your health.  Most adults should exercise for at least 150 minutes each week. The exercise should increase your heart rate and make you sweat (moderate-intensity exercise).  Most adults should also do strengthening exercises at least twice a week. This is in addition to the moderate-intensity exercise.  Maintain a healthy weight  Body mass index (BMI) is a measurement that can be used to identify possible weight problems. It estimates body fat based on height and weight. Your health care provider can help determine your BMI and help you achieve or maintain a healthy weight.  For females 28 years of age and older:   A BMI below 18.5 is considered underweight.  A BMI of 18.5 to 24.9 is normal.  A BMI of 25 to 29.9 is considered overweight.  A BMI of 30 and above is considered obese.  Watch levels of cholesterol and blood lipids  You should start having your blood tested for lipids and cholesterol at 80 years of age, then have this test every 5 years.  You may need to have your cholesterol levels checked more often if:  Your lipid  or cholesterol levels are high.  You are older than 80 years of age.  You are at high risk for heart disease.  CANCER SCREENING   Lung Cancer  Lung cancer screening is recommended for adults 75-66 years old who are at high risk for lung cancer because of a history of smoking.  A yearly low-dose CT scan of the lungs is recommended for people who:  Currently smoke.  Have quit within the past 15 years.  Have at least a 30-pack-year history of smoking. A pack year is smoking an average of one pack of cigarettes a day for 1 year.  Yearly screening should continue until it has been 15 years since you quit.  Yearly screening should stop if you develop a health problem that would prevent you from having lung cancer treatment.  Breast Cancer  Practice breast self-awareness. This means understanding how your breasts normally appear and feel.  It also means doing regular breast self-exams. Let your health care provider know about any changes, no matter how small.  If you are in your 20s or 30s, you should have a clinical breast exam (CBE) by a health care provider every 1-3 years as part of a regular health exam.  If you are 25 or older, have a CBE every year. Also consider having a breast X-ray (mammogram) every year.  If you have a family history of breast cancer, talk to your health care provider about genetic screening.  If you  are at high risk for breast cancer, talk to your health care provider about having an MRI and a mammogram every year.  Breast cancer gene (BRCA) assessment is recommended for women who have family members with BRCA-related cancers. BRCA-related cancers include:  Breast.  Ovarian.  Tubal.  Peritoneal cancers.  Results of the assessment will determine the need for genetic counseling and BRCA1 and BRCA2 testing. Cervical Cancer Your health care provider may recommend that you be screened regularly for cancer of the pelvic organs (ovaries, uterus, and  vagina). This screening involves a pelvic examination, including checking for microscopic changes to the surface of your cervix (Pap test). You may be encouraged to have this screening done every 3 years, beginning at age 21.  For women ages 30-65, health care providers may recommend pelvic exams and Pap testing every 3 years, or they may recommend the Pap and pelvic exam, combined with testing for human papilloma virus (HPV), every 5 years. Some types of HPV increase your risk of cervical cancer. Testing for HPV may also be done on women of any age with unclear Pap test results.  Other health care providers may not recommend any screening for nonpregnant women who are considered low risk for pelvic cancer and who do not have symptoms. Ask your health care provider if a screening pelvic exam is right for you.  If you have had past treatment for cervical cancer or a condition that could lead to cancer, you need Pap tests and screening for cancer for at least 20 years after your treatment. If Pap tests have been discontinued, your risk factors (such as having a new sexual partner) need to be reassessed to determine if screening should resume. Some women have medical problems that increase the chance of getting cervical cancer. In these cases, your health care provider may recommend more frequent screening and Pap tests. Colorectal Cancer  This type of cancer can be detected and often prevented.  Routine colorectal cancer screening usually begins at 80 years of age and continues through 80 years of age.  Your health care provider may recommend screening at an earlier age if you have risk factors for colon cancer.  Your health care provider may also recommend using home test kits to check for hidden blood in the stool.  A small camera at the end of a tube can be used to examine your colon directly (sigmoidoscopy or colonoscopy). This is done to check for the earliest forms of colorectal  cancer.  Routine screening usually begins at age 50.  Direct examination of the colon should be repeated every 5-10 years through 80 years of age. However, you may need to be screened more often if early forms of precancerous polyps or small growths are found. Skin Cancer  Check your skin from head to toe regularly.  Tell your health care provider about any new moles or changes in moles, especially if there is a change in a mole's shape or color.  Also tell your health care provider if you have a mole that is larger than the size of a pencil eraser.  Always use sunscreen. Apply sunscreen liberally and repeatedly throughout the day.  Protect yourself by wearing long sleeves, pants, a wide-brimmed hat, and sunglasses whenever you are outside. HEART DISEASE, DIABETES, AND HIGH BLOOD PRESSURE   High blood pressure causes heart disease and increases the risk of stroke. High blood pressure is more likely to develop in:  People who have blood pressure in the high end   of the normal range (130-139/85-89 mm Hg).  People who are overweight or obese.  People who are African American.  If you are 34-42 years of age, have your blood pressure checked every 3-5 years. If you are 85 years of age or older, have your blood pressure checked every year. You should have your blood pressure measured twice--once when you are at a hospital or clinic, and once when you are not at a hospital or clinic. Record the average of the two measurements. To check your blood pressure when you are not at a hospital or clinic, you can use:  An automated blood pressure machine at a pharmacy.  A home blood pressure monitor.  If you are between 35 years and 54 years old, ask your health care provider if you should take aspirin to prevent strokes.  Have regular diabetes screenings. This involves taking a blood sample to check your fasting blood sugar level.  If you are at a normal weight and have a low risk for diabetes,  have this test once every three years after 80 years of age.  If you are overweight and have a high risk for diabetes, consider being tested at a younger age or more often. PREVENTING INFECTION  Hepatitis B  If you have a higher risk for hepatitis B, you should be screened for this virus. You are considered at high risk for hepatitis B if:  You were born in a country where hepatitis B is common. Ask your health care provider which countries are considered high risk.  Your parents were born in a high-risk country, and you have not been immunized against hepatitis B (hepatitis B vaccine).  You have HIV or AIDS.  You use needles to inject street drugs.  You live with someone who has hepatitis B.  You have had sex with someone who has hepatitis B.  You get hemodialysis treatment.  You take certain medicines for conditions, including cancer, organ transplantation, and autoimmune conditions. Hepatitis C  Blood testing is recommended for:  Everyone born from [birth year] through 1963.  Anyone with known risk factors for hepatitis C. Sexually transmitted infections (STIs)  You should be screened for sexually transmitted infections (STIs) including gonorrhea and chlamydia if:  You are sexually active and are younger than 80 years of age.  You are older than 80 years of age and your health care provider tells you that you are at risk for this type of infection.  Your sexual activity has changed since you were last screened and you are at an increased risk for chlamydia or gonorrhea. Ask your health care provider if you are at risk.  If you do not have HIV, but are at risk, it may be recommended that you take a prescription medicine daily to prevent HIV infection. This is called pre-exposure prophylaxis (PrEP). You are considered at risk if:  You are sexually active and do not regularly use condoms or know the HIV status of your partner(s).  You take drugs by injection.  You are sexually  active with a partner who has HIV. Talk with your health care provider about whether you are at high risk of being infected with HIV. If you choose to begin PrEP, you should first be tested for HIV. You should then be tested every 3 months for as long as you are taking PrEP.  PREGNANCY   If you are premenopausal and you may become pregnant, ask your health care provider about preconception counseling.  If you may  become pregnant, take 400 to 800 micrograms (mcg) of folic acid every day.  If you want to prevent pregnancy, talk to your health care provider about birth control (contraception). OSTEOPOROSIS AND MENOPAUSE   Osteoporosis is a disease in which the bones lose minerals and strength with aging. This can result in serious bone fractures. Your risk for osteoporosis can be identified using a bone density scan.  If you are 61 years of age or older, or if you are at risk for osteoporosis and fractures, ask your health care provider if you should be screened.  Ask your health care provider whether you should take a calcium or vitamin D supplement to lower your risk for osteoporosis.  Menopause may have certain physical symptoms and risks.  Hormone replacement therapy may reduce some of these symptoms and risks. Talk to your health care provider about whether hormone replacement therapy is right for you.  HOME CARE INSTRUCTIONS   Schedule regular health, dental, and eye exams.  Stay current with your immunizations.   Do not use any tobacco products including cigarettes, chewing tobacco, or electronic cigarettes.  If you are pregnant, do not drink alcohol.  If you are breastfeeding, limit how much and how often you drink alcohol.  Limit alcohol intake to no more than 1 drink per day for nonpregnant women. One drink equals 12 ounces of beer, 5 ounces of wine, or 1 ounces of hard liquor.  Do not use street drugs.  Do not share needles.  Ask your health care provider for help if  you need support or information about quitting drugs.  Tell your health care provider if you often feel depressed.  Tell your health care provider if you have ever been abused or do not feel safe at home.   This information is not intended to replace advice given to you by your health care provider. Make sure you discuss any questions you have with your health care provider.   Document Released: 12/13/2010 Document Revised: 06/20/2014 Document Reviewed: 05/01/2013 Elsevier Interactive Patient Education [employer].

## 2015-07-24 NOTE — Progress Notes (Signed)
Subjective:   [name] is a 82 y.o. female who presents for Medicare Annual (Subsequent) preventive examination.  Review of Systems:   Cardiac Risk Factors include: advanced age (>67men, >74 women);dyslipidemia;family history of premature cardiovascular disease;sedentary lifestyle HRA assessment completed during visit; [name] The Patient was informed that this wellness visit is to identify risk and educate on how to reduce risk for increase disease through lifestyle changes.   ROS deferred to CPE exam with physician  Medical issues  HTN; 150 80; states it is up when seeing the doctor  Hyperlipidemia  07/22/14; Cho 233; Trig 376; HDL 50; LDL 141/ Glucose 104  Dtr cancer; uterine;  Mother had heart disease Smoking; former; quit 1982; approx. 82 yo   AD: no but has discussed with MD   BMI: 22 Diet; weight has been trending down in last 2 years Do not have appetite; eats fairly good; Cereal; fruit; coffee Lunch; fruit; eats when son gets off work; Son takes her to dinner;  Exercise;  walk around the home, cleaning;   SAFETY live in single family home Bathroom safety; shower separate; doesn't need rails;  Bars on door;  Warehouse manager; yes Smoke Careers adviser; yes Firearms safety keep is safe place  Driving accidents and seatbelt/ no Sun protection/ not in the sun  Stressors; calm at present;   Medication review/ stopped inhaler and does not need it anymore.   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 Can't go shopping; Declines walker; Uses a cane; uses cane on right; surgery on left hip.  Sleep patterns; fairly well    Urinary or fecal incontinence reviewed: No; has some constipation but take prunes and this helps    Counseling: Colonoscopy;  01/2003 EKG: 05/2010 Hearing: 2000hz  both ears / can still hear tv and talk on the phone Dexa No record / declines  Mammagram 03/2006' no  more Ophthalmology exam; just had bilateral cataracts removed Dec  Immunizations Due Zostavax; will discuss with Dr. Sharlet Richard PCV 13; will take pneumonia today Flu; declined   Current Care Team reviewed and updated     Objective:     Vitals: BP 150/70 mmHg  Pulse 100  Temp(Src) 97.9 F (36.6 C) (Oral)  Ht 5\' 4"  (1.626 m)  Wt 128 lb 8 oz (58.287 kg)  BMI 22.05 kg/m2  SpO2 97%  Tobacco History  Smoking status  . Former Smoker  . Quit date: 06/14/1980  Smokeless tobacco  . Never Used     Counseling given: Yes   Past Medical History  Diagnosis Date  . ALLERGIC RHINITIS 05/29/2007  . CHRONIC OBSTRUCTIVE PULMONARY DISEASE, ACUTE EXACERBATION 11/21/2007  . COLONIC POLYPS, HX OF 11/21/2007  . COPD 11/21/2007  . DIVERTICULOSIS, COLON 11/21/2007  . HIP PAIN, LEFT, CHRONIC 06/01/2010  . HYPERLIPIDEMIA 05/29/2007  . HYPERTENSION 05/29/2007  . Hypopotassemia 06/12/2007  . HYSTERECTOMY, HX OF 05/29/2007  . Other acquired absence of organ 05/29/2007  . Other postprocedural status(V45.89) 05/29/2007  . RADICULOPATHY 05/29/2007  . REACTIVE AIRWAY DISEASE 05/29/2007   Past Surgical History  Procedure Laterality Date  . Lumbar laminectomy    . Lipoma excision    . Dilation and curettage of uterus    . Appendectomy    . Abdominal hysterectomy    . Tonsillectomy    .  Cataract extraction w/ intraocular lens implant Bilateral 05/2015    around december 9th and 30    Family History  Problem Relation Age of Onset  . Arthritis Mother   . Heart disease Mother   . Uterine cancer Daughter   . Cancer Daughter     ovarian  . Heart disease Father   . Diabetes Neg Hx   . COPD Neg Hx   . Stroke Neg Hx   . Kidney disease Neg Hx    History  Sexual Activity  . Sexual Activity: No    Outpatient Encounter Prescriptions as of 07/24/2015   Medication Sig  . amLODipine (NORVASC) 10 MG tablet Take 1 tablet (10 mg total) by mouth daily.  Marland Kitchen ketoconazole (NIZORAL) 2 % cream Apply 1 application topically daily.  . Multiple Vitamins-Minerals (CENTRUM SILVER) tablet Take 1 tablet by mouth daily.  Marland Kitchen triamterene-hydrochlorothiazide (MAXZIDE) 75-50 MG per tablet Take 1 tablet by mouth daily.  Marland Kitchen albuterol (VENTOLIN HFA) 108 (90 BASE) MCG/ACT inhaler Inhale 2 puffs into the lungs every 4 (four) hours as needed. (Patient not taking: Reported on 07/24/2015)   No facility-administered encounter medications on file as of 07/24/2015.    Activities of Daily Living In your present state of health, do you have any difficulty performing the following activities: 07/24/2015  Hearing? N  Difficulty concentrating or making decisions? N  Walking or climbing stairs? N  Dressing or bathing? N  Doing errands, shopping? Y  Preparing Food and eating ? N  Using the Toilet? N  In the past six months, have you accidently leaked urine? N  Do you have problems with loss of bowel control? N  Managing your Medications? N  Managing your Finances? N  Housekeeping or managing your Housekeeping? N    Patient Care Team: Beverly Koch, MD as PCP - General (Internal Medicine)    Assessment:    Assessment   Patient presents for yearly preventative medicine examination. Medicare questionnaire screening were completed, i.e. Functional; fall risk; depression, memory loss and hearing were all unremarkable; hearing 2000hz  both ears but no issues talking on the phone or hearing the TV   All immunizations and health maintenance protocols were reviewed with the patient  And she declines her flu shot but will take the prevenar;   Education provided for laboratory screens;  Will defer to Dr. Sharlet Richard but [name] were elevated last year; no ETOH intake but likes candy; kisses and peanut butter cups  Medication reconciliation, past medical history, social history,  problem list and allergies were reviewed in detail with the patient/ does not use inhaler; was ordered many years ago but not issues at present  Goals were established with regard to maintenance; farily sdentary; not interested in exercise;   End of life planning was discussed. States her dtr Cherie Ouch has HCPOA    Exercise Activities and Dietary recommendations Current Exercise Habits:: Home exercise routine (walks in home; sedentary), Intensity: Mild  Goals    . patient     To maintain health       Fall Risk Fall Risk  07/24/2015  Falls in the past year? No   Depression Screen PHQ 2/9 Scores 07/24/2015  PHQ - 2 Score 0    Appears sad but states she is not; Verbalizes no depression   Cognitive Testing No flowsheet data found.   Ad8 score 0; continues to manage financial affairs;   Immunization History  Administered Date(s) Administered  . Pneumococcal Polysaccharide-23 05/30/2007  . Tetanus  06/18/2012   Screening Tests Health Maintenance  Topic Date Due  . ZOSTAVAX  02/15/1989  . DEXA SCAN  02/15/1994  . PNA vac Low Risk Adult (2 of 2 - PCV13) 05/29/2008  . INFLUENZA VACCINE  01/12/2015  . TETANUS/TDAP  06/18/2022      Plan:   During the course of the visit the patient was educated and counseled about the following appropriate screening and preventive services:   Vaccines to include Pneumoccal, Influenza, Hepatitis B, Td, Zostavax, HCV/ will take the Prevnar today;   Electrocardiogram 05/2010  Cardiovascular Disease/ no c/o  Colorectal cancer screening/ aged out  Bone density screening/ declined  Diabetes screening/ no  Glaucoma screening no  Mammography/PAP aged out  Nutrition counseling bmi 44   Patient Instructions (the written plan) was given to the patient.   Wynetta Fines, RN  07/24/2015

## 2015-07-24 NOTE — Progress Notes (Signed)
Pre visit review using our clinic review tool, if applicable. No additional management support is needed unless otherwise documented below in the visit note. 

## 2015-07-26 DIAGNOSIS — G5601 Carpal tunnel syndrome, right upper limb: Secondary | ICD-10-CM | POA: Insufficient documentation

## 2015-07-26 NOTE — Assessment & Plan Note (Signed)
Talked to her about nerve conduction for confirmation and she does not feel the need for that. No medications needed. Talked to her about wrist splinting and finding a new position for her hand when sitting that does not compress the wrist. She will work on it.

## 2015-07-26 NOTE — Progress Notes (Signed)
   Subjective:    Patient ID: [name], female    DOB: [birth year], 82 y.o.   MRN: [id number]  HPI The patient is an 82 YO female coming in for right hand numbness in her 3rd finger. She sits on this hand as it is comfortable but it goes numb. She does not have pain. This has limited her fine control with that hand. She does not have pain or numbness in the arm or elbow or shoulder. No neck pain. No overall weakness of the arm.   Review of Systems  Constitutional: Negative for fever, activity change, appetite change, fatigue and unexpected weight change.  HENT: Negative.   Eyes: Negative.   Respiratory: Negative for cough, chest tightness, shortness of breath and wheezing.   Cardiovascular: Negative for chest pain, palpitations and leg swelling.  Gastrointestinal: Negative for abdominal pain, diarrhea, constipation and abdominal distention.  Musculoskeletal: Negative.   Skin: Negative.   Neurological: Positive for numbness. Negative for dizziness, weakness and headaches.  Psychiatric/Behavioral: Negative.       Objective:   Physical Exam  Constitutional: She is oriented to person, place, and time. She appears well-developed and well-nourished.  HENT:  Head: Normocephalic and atraumatic.  Eyes: EOM are normal.  Neck: Normal range of motion.  Cardiovascular: Normal rate and regular rhythm.   Pulmonary/Chest: Effort normal and breath sounds normal. No respiratory distress. She has no wheezes. She has no rales.  Abdominal: Soft. Bowel sounds are normal.  Musculoskeletal: She exhibits no edema.  Neurological: She is alert and oriented to person, place, and time. Coordination normal.  Some numbness in the right 3rd digit, tinel sign positive  Skin: Skin is warm and dry.    Filed Vitals:   07/24/15 1253  BP: 150/70  Pulse: 100  Temp: 97.9 F (36.6 C)  TempSrc: Oral  Height: 5\' 4"  (1.626 m)  Weight: 128 lb 8 oz (58.287 kg)  SpO2: 97%      Assessment & Plan:

## 2015-07-26 NOTE — Progress Notes (Signed)
Medical screening examination/treatment/procedure(s) were performed by non-physician practitioner and as supervising physician I was immediately available for consultation/collaboration. I agree with above. Kailea Dannemiller A Quintavia Rogstad, MD 

## 2015-07-26 NOTE — Assessment & Plan Note (Signed)
BP controlled on amlodipine and hctz. Checking CMP today and adjust as needed.

## 2015-07-29 ENCOUNTER — Telehealth: Payer: Self-pay | Admitting: Internal Medicine

## 2015-07-29 NOTE — Telephone Encounter (Signed)
Patient called in regarding results. Advised of lab results and imaging results. She does have further questions on the xray findings, and wishes for you to call her back. She states that she will be available until 3 tomorrow at the # listed.   Additionally, she requests that we mail her a copy of the results so that she can read them further.

## 2015-07-30 NOTE — Telephone Encounter (Signed)
Called and spoke with patient and discussed results with her.

## 2015-08-17 ENCOUNTER — Other Ambulatory Visit: Payer: Self-pay

## 2015-08-17 MED ORDER — AMLODIPINE BESYLATE 10 MG PO TABS: 10.0000 mg | ORAL_TABLET | Freq: Every day | ORAL | Status: DC

## 2015-10-05 ENCOUNTER — Ambulatory Visit (INDEPENDENT_AMBULATORY_CARE_PROVIDER_SITE_OTHER): Payer: Medicare Other | Admitting: Sports Medicine

## 2015-10-05 ENCOUNTER — Encounter: Payer: Self-pay | Admitting: Sports Medicine

## 2015-10-05 DIAGNOSIS — B351 Tinea unguium: Secondary | ICD-10-CM

## 2015-10-05 DIAGNOSIS — M79673 Pain in unspecified foot: Secondary | ICD-10-CM

## 2015-10-05 NOTE — Progress Notes (Signed)
Patient ID: MARGUERETE LAVERDURE, female   DOB: 1927/03/21, 82 y.o.   MRN: [id number]  Subjective: [name] is a 82 y.o. female patient seen today in office with complaint of painful thickened and elongated toenails; unable to trim. Patient denies any changes with medical history since last visit. Patient has no other pedal complaints at this time.   Patient Active Problem List   Diagnosis Date Noted  . Carpal tunnel syndrome 07/26/2015  . Advanced care planning/counseling discussion 07/17/2013  . Chronic low back pain 06/18/2012  . Routine health maintenance 06/18/2011  . COPD, mild ([place]) 11/21/2007  . Hyperlipidemia 05/29/2007  . Essential hypertension 05/29/2007  . ALLERGIC RHINITIS 05/29/2007   Current Outpatient Prescriptions on File Prior to Visit  Medication Sig Dispense Refill  . amLODipine (NORVASC) 10 MG tablet Take 1 tablet (10 mg total) by mouth daily. 90 tablet 3  . ketoconazole (NIZORAL) 2 % cream Apply 1 application topically daily. 30 g 0  . Multiple Vitamins-Minerals (CENTRUM SILVER) tablet Take 1 tablet by mouth daily.    Marland Kitchen triamterene-hydrochlorothiazide (MAXZIDE-25) 37.5-25 MG tablet Take 1 tablet by mouth daily. 90 tablet 3   No current facility-administered medications on file prior to visit.   Allergies  Allergen Reactions  . Codeine     Objective: Physical Exam  General: Well developed, nourished, no acute distress, awake, alert and oriented x 3  Vascular: Dorsalis pedis artery 1/4 bilateral, Posterior tibial artery 1/4 bilateral, skin temperature warm to warm proximal to distal bilateral lower extremities, mild varicosities, pedal hair present bilateral.  Neurological: Gross sensation present via light touch bilateral.   Dermatological: Skin is warm, dry, and supple bilateral, Nails 1-10 are tender, long, thick, and discolored with mild subungal debris, no webspace macerations present bilateral, no open lesions present bilateral, no  callus/corns/hyperkeratotic tissue present bilateral. No signs of infection bilateral.  Musculoskeletal: Asymptomatic hammertoe deformities noted bilateral. Muscular strength within normal limits without pain on range of motion. No pain with calf compression bilateral.  Assessment and Plan:  Problem List Items Addressed This Visit    None    Visit Diagnoses    Dermatophytosis of nail    -  Primary    Foot pain, unspecified laterality          -Examined patient.  -Discussed treatment options for painful mycotic nails. -Mechanically debrided and reduced mycotic nails with sterile nail nipper and dremel nail file without incident. -Patient to return in 3 months for follow up evaluation or sooner if symptoms worsen.  Landis Martins, DPM

## 2015-10-12 ENCOUNTER — Telehealth: Payer: Self-pay

## 2015-10-13 NOTE — Telephone Encounter (Signed)
error 

## 2015-10-14 ENCOUNTER — Encounter: Payer: Self-pay | Admitting: Internal Medicine

## 2015-10-14 ENCOUNTER — Ambulatory Visit (INDEPENDENT_AMBULATORY_CARE_PROVIDER_SITE_OTHER): Payer: Medicare Other | Admitting: Internal Medicine

## 2015-10-14 VITALS — BP 150/82 | HR 121 | Temp 97.5°F | Resp 20 | Wt 131.0 lb

## 2015-10-14 DIAGNOSIS — H6092 Unspecified otitis externa, left ear: Secondary | ICD-10-CM | POA: Diagnosis not present

## 2015-10-14 DIAGNOSIS — J449 Chronic obstructive pulmonary disease, unspecified: Secondary | ICD-10-CM | POA: Diagnosis not present

## 2015-10-14 DIAGNOSIS — I10 Essential (primary) hypertension: Secondary | ICD-10-CM | POA: Diagnosis not present

## 2015-10-14 DIAGNOSIS — J019 Acute sinusitis, unspecified: Secondary | ICD-10-CM | POA: Insufficient documentation

## 2015-10-14 MED ORDER — NEOMYCIN-POLYMYXIN-HC 1 % OT SOLN: 3.0000 [drp] | Freq: Four times a day (QID) | OTIC | Status: DC

## 2015-10-14 MED ORDER — ALBUTEROL SULFATE HFA 108 (90 BASE) MCG/ACT IN AERS: 2.0000 | INHALATION_SPRAY | Freq: Four times a day (QID) | RESPIRATORY_TRACT | Status: DC | PRN

## 2015-10-14 MED ORDER — AZITHROMYCIN 250 MG PO TABS: ORAL_TABLET | ORAL | Status: DC

## 2015-10-14 NOTE — Progress Notes (Signed)
Pre visit review using our clinic review tool, if applicable. No additional management support is needed unless otherwise documented below in the visit note. 

## 2015-10-14 NOTE — Progress Notes (Signed)
Subjective:    Patient ID: [name], female    DOB: [birth year], 82 y.o.   MRN: [id number]  HPI   Here with 2-3 days acute onset fever, facial pain, pressure, headache, general weakness and malaise, and greenish d/c, with mild ST and cough, but pt denies chest pain, wheezing, increased sob or doe, orthopnea, PND, increased LE swelling, palpitations, dizziness or syncope.  Also with left ear pain, pressure and small colored dc, no blood.  Pt denies new neurological symptoms such as new headache, or facial or extremity weakness or numbness   Pt denies polydipsia, polyuria Past Medical History  Diagnosis Date  . ALLERGIC RHINITIS 05/29/2007  . CHRONIC OBSTRUCTIVE PULMONARY DISEASE, ACUTE EXACERBATION 11/21/2007  . COLONIC POLYPS, HX OF 11/21/2007  . COPD 11/21/2007  . DIVERTICULOSIS, COLON 11/21/2007  . HIP PAIN, LEFT, CHRONIC 06/01/2010  . HYPERLIPIDEMIA 05/29/2007  . HYPERTENSION 05/29/2007  . Hypopotassemia 06/12/2007  . HYSTERECTOMY, HX OF 05/29/2007  . Other acquired absence of organ 05/29/2007  . Other postprocedural status(V45.89) 05/29/2007  . RADICULOPATHY 05/29/2007  . REACTIVE AIRWAY DISEASE 05/29/2007   Past Surgical History  Procedure Laterality Date  . Lumbar laminectomy    . Lipoma excision    . Dilation and curettage of uterus    . Appendectomy    . Abdominal hysterectomy    . Tonsillectomy    . Cataract extraction w/ intraocular lens implant Bilateral 05/2015    around december 9th and 28     reports that she quit smoking about 35 years ago. She has never used smokeless tobacco. She reports that she does not drink alcohol or use illicit drugs. family history includes Arthritis in her mother; Cancer in her daughter; Heart disease in her father and mother; Uterine cancer in her daughter. There is no history of Diabetes, COPD, Stroke, or Kidney disease. Allergies  Allergen Reactions  . Codeine    Current Outpatient Prescriptions on File Prior to Visit    Medication Sig Dispense Refill  . amLODipine (NORVASC) 10 MG tablet Take 1 tablet (10 mg total) by mouth daily. 90 tablet 3  . ketoconazole (NIZORAL) 2 % cream Apply 1 application topically daily. 30 g 0  . Multiple Vitamins-Minerals (CENTRUM SILVER) tablet Take 1 tablet by mouth daily.    Marland Kitchen triamterene-hydrochlorothiazide (MAXZIDE-25) 37.5-25 MG tablet Take 1 tablet by mouth daily. 90 tablet 3   No current facility-administered medications on file prior to visit.   Review of Systems  Constitutional: Negative for unusual diaphoresis or night sweats HENT: Negative for ear swelling or discharge Eyes: Negative for worsening visual haziness  Respiratory: Negative for choking and stridor.   Gastrointestinal: Negative for distension or worsening eructation Genitourinary: Negative for retention or change in urine volume.  Musculoskeletal: Negative for other MSK pain or swelling Skin: Negative for color change and worsening wound Neurological: Negative for tremors and numbness other than noted  Psychiatric/Behavioral: Negative for decreased concentration or agitation other than above       Objective:   Physical Exam BP 150/82 mmHg  Pulse 121  Temp(Src) 97.5 F (36.4 C) (Oral)  Resp 20  Wt 131 lb (59.421 kg)  SpO2 95% VS noted, mild ill Constitutional: Pt appears in no apparent distress HENT: Head: NCAT.  Right Ear: External ear normal.  Left Ear: External ear normal. but canal with 1+ erythema/tender/swelling and slight mucous d/c Bilat tm's with mild erythema.  Max sinus areas mild tender.  Pharynx with mild erythema, no exudate Eyes: . Pupils  are equal, round, and reactive to light. Conjunctivae and EOM are normal Neck: Normal range of motion. Neck supple.  Cardiovascular: Normal rate and regular rhythm.   Pulmonary/Chest: Effort normal and breath sounds without rales or wheezing.  Abd:  Soft, NT, ND, + BS Neurological: Pt is alert. Not confused , motor grossly intact Skin: Skin  is warm. No rash, no LE edema Psychiatric: Pt behavior is normal. No agitation.     Assessment & Plan:

## 2015-10-14 NOTE — Patient Instructions (Signed)
Please take all new medication as prescribed  - the pill antibiotic, as well as left ear drop antibiotic  Please continue all other medications as before, and refills have been done if requested - the inhaler  Please have the pharmacy call with any other refills you may need.  Please keep your appointments with your specialists as you may have planned

## 2015-10-18 NOTE — Assessment & Plan Note (Signed)
Mild to mod, for antibx course,  to f/u any worsening symptoms or concerns 

## 2015-10-18 NOTE — Assessment & Plan Note (Signed)
Mild elev today likely situational, o/w stable overall by history and exam, recent data reviewed with pt, and pt to continue medical treatment as before,  to f/u any worsening symptoms or concerns BP Readings from Last 3 Encounters:  10/14/15 150/82  07/24/15 150/70  07/22/14 138/70

## 2015-10-18 NOTE — Assessment & Plan Note (Signed)
stable overall by history and exam, recent data reviewed with pt, and pt to continue medical treatment as before,  to f/u any worsening symptoms or concerns SpO2 Readings from Last 3 Encounters:  10/14/15 95%  07/24/15 97%  07/22/14 96%

## 2016-01-04 ENCOUNTER — Ambulatory Visit (INDEPENDENT_AMBULATORY_CARE_PROVIDER_SITE_OTHER): Payer: Medicare Other | Admitting: Sports Medicine

## 2016-01-04 ENCOUNTER — Encounter: Payer: Self-pay | Admitting: Sports Medicine

## 2016-01-04 DIAGNOSIS — M79673 Pain in unspecified foot: Secondary | ICD-10-CM

## 2016-01-04 DIAGNOSIS — B351 Tinea unguium: Secondary | ICD-10-CM

## 2016-01-04 NOTE — Progress Notes (Signed)
Patient ID: [name], female   DOB: 1927/02/05, 82 y.o.   MRN: [id number]  Subjective: [name] is a 82 y.o. female patient seen today in office with complaint of painful thickened and elongated toenails; unable to trim. Patient denies any changes with medical history since last visit. Patient has no other pedal complaints at this time.   Patient Active Problem List   Diagnosis Date Noted  . Acute sinus infection 10/14/2015  . Left otitis externa 10/14/2015  . Carpal tunnel syndrome 07/26/2015  . Advanced care planning/counseling discussion 07/17/2013  . Chronic low back pain 06/18/2012  . Routine health maintenance 06/18/2011  . COPD, mild ([place]) 11/21/2007  . Hyperlipidemia 05/29/2007  . Essential hypertension 05/29/2007  . ALLERGIC RHINITIS 05/29/2007   Current Outpatient Prescriptions on File Prior to Visit  Medication Sig Dispense Refill  . albuterol (PROVENTIL HFA;VENTOLIN HFA) 108 (90 Base) MCG/ACT inhaler Inhale 2 puffs into the lungs every 6 (six) hours as needed for wheezing or shortness of breath. 1 Inhaler 5  . amLODipine (NORVASC) 10 MG tablet Take 1 tablet (10 mg total) by mouth daily. 90 tablet 3  . azithromycin (ZITHROMAX Z-PAK) 250 MG tablet Use as directed 6 tablet 1  . ketoconazole (NIZORAL) 2 % cream Apply 1 application topically daily. 30 g 0  . Multiple Vitamins-Minerals (CENTRUM SILVER) tablet Take 1 tablet by mouth daily.    . NEOMYCIN-POLYMYXIN-HYDROCORTISONE (CORTISPORIN) 1 % SOLN otic solution Place 3 drops into the left ear 4 (four) times daily. 10 mL 0  . triamterene-hydrochlorothiazide (MAXZIDE-25) 37.5-25 MG tablet Take 1 tablet by mouth daily. 90 tablet 3   No current facility-administered medications on file prior to visit.    Allergies  Allergen Reactions  . Codeine     Objective: Physical Exam  General: Well developed, nourished, no acute distress, awake, alert and oriented x 3  Vascular: Dorsalis pedis artery 1/4 bilateral,  Posterior tibial artery 1/4 bilateral, skin temperature warm to warm proximal to distal bilateral lower extremities, mild varicosities, pedal hair present bilateral.  Neurological: Gross sensation present via light touch bilateral.   Dermatological: Skin is warm, dry, and supple bilateral, Nails 1-10 are tender, long, thick, and discolored with mild subungal debris, no webspace macerations present bilateral, no open lesions present bilateral, no callus/corns/hyperkeratotic tissue present bilateral. No signs of infection bilateral.  Musculoskeletal: Asymptomatic hammertoe deformities noted bilateral. Muscular strength within normal limits without pain on range of motion. No pain with calf compression bilateral.  Assessment and Plan:  Problem List Items Addressed This Visit    None    Visit Diagnoses    Dermatophytosis of nail    -  Primary   Foot pain, unspecified laterality         -Examined patient.  -Discussed treatment options for painful mycotic nails. -Mechanically debrided and reduced mycotic nails with sterile nail nipper and dremel nail file without incident. -Recommend vinegar soaks -Patient to return in 3 months for follow up evaluation or sooner if symptoms worsen.  Landis Martins, DPM

## 2016-04-12 ENCOUNTER — Encounter: Payer: Self-pay | Admitting: Sports Medicine

## 2016-04-12 ENCOUNTER — Ambulatory Visit (INDEPENDENT_AMBULATORY_CARE_PROVIDER_SITE_OTHER): Payer: Medicare Other | Admitting: Sports Medicine

## 2016-04-12 DIAGNOSIS — M79672 Pain in left foot: Secondary | ICD-10-CM | POA: Diagnosis not present

## 2016-04-12 DIAGNOSIS — M79671 Pain in right foot: Secondary | ICD-10-CM

## 2016-04-12 DIAGNOSIS — B351 Tinea unguium: Secondary | ICD-10-CM

## 2016-04-12 NOTE — Progress Notes (Signed)
Patient ID: [name], female   DOB: [birth year], 82 y.o.   MRN: [id number]  Subjective: [name] is a 82 y.o. female patient seen today in office with complaint of painful thickened and elongated toenails; unable to trim. Patient denies any changes with medical history since last visit. Patient has no other pedal complaints at this time.   Patient Active Problem List   Diagnosis Date Noted  . Acute sinus infection 10/14/2015  . Left otitis externa 10/14/2015  . Carpal tunnel syndrome 07/26/2015  . Advanced care planning/counseling discussion 07/17/2013  . Chronic low back pain 06/18/2012  . Routine health maintenance 06/18/2011  . COPD, mild ([place]) 11/21/2007  . Hyperlipidemia 05/29/2007  . Essential hypertension 05/29/2007  . ALLERGIC RHINITIS 05/29/2007   Current Outpatient Prescriptions on File Prior to Visit  Medication Sig Dispense Refill  . albuterol (PROVENTIL HFA;VENTOLIN HFA) 108 (90 Base) MCG/ACT inhaler Inhale 2 puffs into the lungs every 6 (six) hours as needed for wheezing or shortness of breath. 1 Inhaler 5  . amLODipine (NORVASC) 10 MG tablet Take 1 tablet (10 mg total) by mouth daily. 90 tablet 3  . azithromycin (ZITHROMAX Z-PAK) 250 MG tablet Use as directed 6 tablet 1  . ketoconazole (NIZORAL) 2 % cream Apply 1 application topically daily. 30 g 0  . Multiple Vitamins-Minerals (CENTRUM SILVER) tablet Take 1 tablet by mouth daily.    . NEOMYCIN-POLYMYXIN-HYDROCORTISONE (CORTISPORIN) 1 % SOLN otic solution Place 3 drops into the left ear 4 (four) times daily. 10 mL 0  . triamterene-hydrochlorothiazide (MAXZIDE-25) 37.5-25 MG tablet Take 1 tablet by mouth daily. 90 tablet 3   No current facility-administered medications on file prior to visit.    Allergies  Allergen Reactions  . Codeine     Objective: Physical Exam  General: Well developed, nourished, no acute distress, awake, alert and oriented x 3  Vascular: Dorsalis pedis artery 1/4 bilateral,  Posterior tibial artery 1/4 bilateral, skin temperature warm to warm proximal to distal bilateral lower extremities, mild varicosities, pedal hair present bilateral.  Neurological: Gross sensation present via light touch bilateral.   Dermatological: Skin is warm, dry, and supple bilateral, Nails 1-10 are tender, long, thick, and discolored with mild subungal debris, no webspace macerations present bilateral, no open lesions present bilateral, no callus/corns/hyperkeratotic tissue present bilateral. No signs of infection bilateral.  Musculoskeletal: Asymptomatic hammertoe deformities noted bilateral. Muscular strength within normal limits without pain on range of motion. No pain with calf compression bilateral.  Assessment and Plan:  Problem List Items Addressed This Visit    None    Visit Diagnoses    Dermatophytosis of nail    -  Primary   Foot pain, bilateral         -Examined patient.  -Discussed treatment options for painful mycotic nails. -Mechanically debrided and reduced mycotic nails with sterile nail nipper and dremel nail file without incident. -Recommend continue with vinegar soaks -Patient to return in 3 months for follow up evaluation or sooner if symptoms worsen.  Landis Martins, DPM

## 2016-06-08 ENCOUNTER — Ambulatory Visit (INDEPENDENT_AMBULATORY_CARE_PROVIDER_SITE_OTHER): Payer: Medicare Other | Admitting: Adult Health

## 2016-06-08 ENCOUNTER — Encounter: Payer: Self-pay | Admitting: Adult Health

## 2016-06-08 ENCOUNTER — Ambulatory Visit (INDEPENDENT_AMBULATORY_CARE_PROVIDER_SITE_OTHER)
Admission: RE | Admit: 2016-06-08 | Discharge: 2016-06-08 | Disposition: A | Discharge status: Home / Self Care | Payer: Medicare Other | Source: Ambulatory Visit | Attending: Adult Health | Admitting: Adult Health

## 2016-06-08 VITALS — BP 162/78 | HR 96 | Temp 100.0°F | Ht 64.0 in | Wt 124.3 lb

## 2016-06-08 DIAGNOSIS — R05 Cough: Secondary | ICD-10-CM | POA: Diagnosis not present

## 2016-06-08 DIAGNOSIS — R0602 Shortness of breath: Secondary | ICD-10-CM

## 2016-06-08 MED ORDER — LEVOFLOXACIN 500 MG PO TABS: 500.0000 mg | ORAL_TABLET | Freq: Every day | ORAL | 0 refills | Status: DC

## 2016-06-08 MED ORDER — PREDNISONE 50 MG PO TABS: 50.0000 mg | ORAL_TABLET | Freq: Every day | ORAL | 0 refills | Status: DC

## 2016-06-08 MED ORDER — IPRATROPIUM-ALBUTEROL 0.5-2.5 (3) MG/3ML IN SOLN: 3.0000 mL | Freq: Once | RESPIRATORY_TRACT | Status: DC

## 2016-06-08 NOTE — Patient Instructions (Signed)
It was great meeting you today.   I have sent in a prescription for Levaquin and Prednisone. Take this as directed. You can start it tonight.   Take the first prednisone tonight when you get it and then start taking it in the morning.   Follow up with your PCP as soon as possible.   If your symptoms do not improve or you get worse, please presents to the ER

## 2016-06-08 NOTE — Progress Notes (Signed)
Subjective:    Patient ID: [name], female    DOB: July 15, 1926, 82 y.o.   MRN: [id number]  HPI  82 year old female who presents to the office for the chief complaint of Shortness of Breath. She reports that she has been becoming progressively short of breath over the last week. She has a history of COPD and has been using her inhalers as directed but she feels as though they are not working as well as they used to. Shortness of breath is both with exertion and rest but is worse with exertion. She does not wear oxygen and reports that she never has. She reports having a productive cough with " gross spit".   She denies any fevers, chills, nausea, vomiting, or diarrhea. She also denies any sinusitis like symptoms.   Review of Systems  Constitutional: Positive for activity change and fatigue. Negative for appetite change, chills, diaphoresis and fever.  HENT: Negative.   Respiratory: Positive for cough, chest tightness, shortness of breath and wheezing.   All other systems reviewed and are negative.  Past Medical History:  Diagnosis Date  . ALLERGIC RHINITIS 05/29/2007  . CHRONIC OBSTRUCTIVE PULMONARY DISEASE, ACUTE EXACERBATION 11/21/2007  . COLONIC POLYPS, HX OF 11/21/2007  . COPD 11/21/2007  . DIVERTICULOSIS, COLON 11/21/2007  . HIP PAIN, LEFT, CHRONIC 06/01/2010  . HYPERLIPIDEMIA 05/29/2007  . HYPERTENSION 05/29/2007  . Hypopotassemia 06/12/2007  . HYSTERECTOMY, HX OF 05/29/2007  . Other acquired absence of organ 05/29/2007  . Other postprocedural status(V45.89) 05/29/2007  . RADICULOPATHY 05/29/2007  . REACTIVE AIRWAY DISEASE 05/29/2007    Social History   Social History  . Marital status: Widowed    Spouse name: N/A  . Number of children: N/A  . Years of education: N/A   Occupational History  . Not on file.   Social History Main Topics  . Smoking status: Former Smoker    Quit date: 06/14/1980  . Smokeless tobacco: Never Used  . Alcohol use No  . Drug use: No    . Sexual activity: No   Other Topics Concern  . Not on file   Social History Narrative   HSG. Married 10-02-48- 24yr/divorced. Married 10-02-1960- widowed 10-03-99. 2 daughters-'54 (deceased ovarian cancer), 10-03-2063. 1 son Oct 02, 2060.  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  no heroic measures; i.e. Feeding tubes, mechanical ventilation    Past Surgical History:  Procedure Laterality Date  . ABDOMINAL HYSTERECTOMY    . APPENDECTOMY    . CATARACT EXTRACTION W/ INTRAOCULAR LENS IMPLANT Bilateral 05/2015   around december 9th and 28   . DILATION AND CURETTAGE OF UTERUS    . LIPOMA EXCISION    . LUMBAR LAMINECTOMY    . TONSILLECTOMY      Family History  Problem Relation Age of Onset  . Arthritis Mother   . Heart disease Mother   . Uterine cancer Daughter   . Cancer Daughter     ovarian  . Heart disease Father   . Diabetes Neg Hx   . COPD Neg Hx   . Stroke Neg Hx   . Kidney disease Neg Hx     Allergies  Allergen Reactions  . Codeine     Current Outpatient Prescriptions on File Prior to Visit  Medication Sig Dispense Refill  . albuterol (PROVENTIL HFA;VENTOLIN HFA) 108 (90 Base) MCG/ACT inhaler Inhale 2 puffs into the lungs every 6 (six) hours as needed for wheezing or shortness  of breath. 1 Inhaler 5  . amLODipine (NORVASC) 10 MG tablet Take 1 tablet (10 mg total) by mouth daily. 90 tablet 3  . ketoconazole (NIZORAL) 2 % cream Apply 1 application topically daily. 30 g 0  . Multiple Vitamins-Minerals (CENTRUM SILVER) tablet Take 1 tablet by mouth daily.    . NEOMYCIN-POLYMYXIN-HYDROCORTISONE (CORTISPORIN) 1 % SOLN otic solution Place 3 drops into the left ear 4 (four) times daily. 10 mL 0  . triamterene-hydrochlorothiazide (MAXZIDE-25) 37.5-25 MG tablet Take 1 tablet by mouth daily. 90 tablet 3   No current facility-administered medications on file prior to visit.     BP (!) 162/78   Pulse 96   Temp 100 F (37.8 C) (Oral)   Ht 5\' 4"  (1.626  m)   Wt 124 lb 4.8 oz (56.4 kg)   SpO2 (!) 88%   BMI 21.34 kg/m       Objective:   Physical Exam  Constitutional: She is oriented to person, place, and time. She appears well-developed and well-nourished. No distress.  HENT:  Head: Normocephalic and atraumatic.  Right Ear: External ear normal.  Left Ear: External ear normal.  Nose: Nose normal.  Mouth/Throat: Oropharynx is clear and moist. No oropharyngeal exudate.  Cardiovascular: Normal rate, regular rhythm, normal heart sounds and intact distal pulses.  Exam reveals no friction rub.   No murmur heard. Pulmonary/Chest: Effort normal. No respiratory distress. She has decreased breath sounds in the right middle field, the right lower field, the left middle field and the left lower field. She has wheezes in the right lower field and the left lower field. She has rhonchi in the right lower field and the left lower field. She has no rales. She exhibits no tenderness.  Abdominal: Soft. Normal appearance.  Musculoskeletal: Normal range of motion. She exhibits no edema or tenderness.  Neurological: She is alert and oriented to person, place, and time.  Skin: Skin is warm and dry. No rash noted. She is not diaphoretic. No erythema. No pallor.  Psychiatric: She has a normal mood and affect. Her behavior is normal. Judgment and thought content normal.  Nursing note and vitals reviewed.     Assessment & Plan:  1. Shortness of breath - Concern for pneumonia vs COPD flare. From previous notes it looks like her oxygen saturation appears to be around 95% on RA at baseline.  - levofloxacin (LEVAQUIN) 500 MG tablet; Take 1 tablet (500 mg total) by mouth daily.  Dispense: 7 tablet; Refill: 0 - predniSONE (DELTASONE) 50 MG tablet; Take 1 tablet (50 mg total) by mouth daily with breakfast.  Dispense: 5 tablet; Refill: 0 - DG Chest 2 View; Future - ipratropium-albuterol (DUONEB) 0.5-2.5 (3) MG/3ML nebulizer solution 3 mL; Take 3 mLs by nebulization  once. - She reported that she had slightly improvement with breathing after duo neb. Exam revealed that she she sounded less tight but more wheezing and rhonchi after breathing treatment. Her oxygen saturation stayed at 88%   Dorothyann Peng, NP

## 2016-06-21 ENCOUNTER — Encounter: Payer: Self-pay | Admitting: Internal Medicine

## 2016-06-21 ENCOUNTER — Other Ambulatory Visit (INDEPENDENT_AMBULATORY_CARE_PROVIDER_SITE_OTHER): Payer: Medicare Other

## 2016-06-21 ENCOUNTER — Ambulatory Visit (INDEPENDENT_AMBULATORY_CARE_PROVIDER_SITE_OTHER): Payer: Medicare Other | Admitting: Internal Medicine

## 2016-06-21 VITALS — BP 160/80 | HR 112 | Temp 97.7°F | Resp 16 | Ht 64.0 in | Wt 126.0 lb

## 2016-06-21 DIAGNOSIS — J449 Chronic obstructive pulmonary disease, unspecified: Secondary | ICD-10-CM | POA: Diagnosis not present

## 2016-06-21 DIAGNOSIS — Z23 Encounter for immunization: Secondary | ICD-10-CM

## 2016-06-21 DIAGNOSIS — R5383 Other fatigue: Secondary | ICD-10-CM

## 2016-06-21 LAB — CBC
HCT: 40.8 % (ref 36.0–46.0)
HEMOGLOBIN: 13.9 g/dL (ref 12.0–15.0)
MCHC: 34.1 g/dL (ref 30.0–36.0)
MCV: 88.2 fl (ref 78.0–100.0)
PLATELETS: 449 10*3/uL — AB (ref 150.0–400.0)
RBC: 4.63 Mil/uL (ref 3.87–5.11)
RDW: 13.7 % (ref 11.5–15.5)
WBC: 11.9 10*3/uL — ABNORMAL HIGH (ref 4.0–10.5)

## 2016-06-21 MED ORDER — ALBUTEROL SULFATE HFA 108 (90 BASE) MCG/ACT IN AERS: 2.0000 | INHALATION_SPRAY | Freq: Four times a day (QID) | RESPIRATORY_TRACT | 5 refills | Status: DC | PRN

## 2016-06-21 NOTE — Progress Notes (Signed)
Pre visit review using our clinic review tool, if applicable. No additional management support is needed unless otherwise documented below in the visit note. 

## 2016-06-21 NOTE — Progress Notes (Signed)
   Subjective:    Patient ID: [name], female    DOB: 1927-06-10, 83 y.o.   MRN: [id number]  HPI The patient is an 83 YO female coming in for follow up of COPD exacerbation. She was seen about 1-2 weeks ago and given levaquin and prednisone which she took fully. She is still not feeling much better. Still with SOB and some cough. She is not taking any allergy or over the counter medication at this time. At the last visit her oxygen levels were down at 88%. Still using more albuterol inhaler than usual and not much energy. Overall she rates her recovery at about 50%. Still not smoking. No falls at home but very weak.   Review of Systems  Constitutional: Positive for activity change and fatigue. Negative for appetite change, chills, fever and unexpected weight change.  HENT: Positive for congestion and rhinorrhea. Negative for ear discharge, ear pain, postnasal drip, sinus pain, sinus pressure, sore throat and trouble swallowing.   Eyes: Negative.   Respiratory: Positive for cough and shortness of breath. Negative for chest tightness and wheezing.   Cardiovascular: Negative for chest pain, palpitations and leg swelling.  Gastrointestinal: Negative.   Musculoskeletal: Negative.   Skin: Negative.      Objective:   Physical Exam  Constitutional: She appears well-developed and well-nourished.  HENT:  Head: Normocephalic and atraumatic.  TMs normal, nose without crusting, oropharynx with some redness and clear drainage.   Eyes: EOM are normal.  Cardiovascular: Normal rate and regular rhythm.   Pulmonary/Chest: Effort normal. No respiratory distress. She has no wheezes. She has no rales.  Good air flow  Abdominal: Soft.  Lymphadenopathy:    She has no cervical adenopathy.  Neurological:  Frail appearing and slow gait.   Skin: Skin is warm and dry.   Vitals:   06/21/16 1525  BP: (!) 160/80  Pulse: (!) 112  Resp: 16  Temp: 97.7 F (36.5 C)  TempSrc: Oral  SpO2: 94%  Weight:  126 lb (57.2 kg)  Height: 5\' 4"  (1.626 m)      Assessment & Plan:  Flu shot given at visit.

## 2016-06-21 NOTE — Patient Instructions (Signed)
We have given you the flu shot today.  We have also given you an inhaler today. Take 1 puff of the inhaler once a day. This inhaler should last 2 weeks. Take it for 2 weeks.   If she is not better at the end of 2 weeks call us and we can refill this.   We have sent in the refill of the albuterol.

## 2016-06-22 NOTE — Assessment & Plan Note (Addendum)
Given sample of breo today in the office and given instructions on use. This is 14 day supply which should aid in her recovery. She does not need antibiotics or steroids today and talked about the likely long recovery time for weakness after this illness. She is also given a refill of her albuterol inhaler. Oxygen levels are back at her baseline and lung exam is improved from documentation 1 week ago.

## 2016-06-24 ENCOUNTER — Ambulatory Visit: Payer: Medicare Other | Admitting: Internal Medicine

## 2016-07-07 ENCOUNTER — Telehealth: Payer: Self-pay | Admitting: Internal Medicine

## 2016-07-07 MED ORDER — FLUTICASONE FUROATE-VILANTEROL 100-25 MCG/INH IN AEPB: 1.0000 | INHALATION_SPRAY | Freq: Every day | RESPIRATORY_TRACT | 1 refills | Status: DC

## 2016-07-07 NOTE — Telephone Encounter (Signed)
Have sent in the inhaler to her pharmacy. If her breathing is not getting better would recommend repeat visit within 1 week.

## 2016-07-07 NOTE — Telephone Encounter (Signed)
Pt called stating she received a sample of an inhaler on 06/21/16 and she is out and is wanting to know if she can have another, she is not getting better, please advise MS

## 2016-07-07 NOTE — Telephone Encounter (Signed)
Patient contacted and stated awareness 

## 2016-07-12 ENCOUNTER — Ambulatory Visit (INDEPENDENT_AMBULATORY_CARE_PROVIDER_SITE_OTHER): Payer: Medicare Other | Admitting: Sports Medicine

## 2016-07-12 ENCOUNTER — Encounter: Payer: Self-pay | Admitting: Sports Medicine

## 2016-07-12 DIAGNOSIS — M79671 Pain in right foot: Secondary | ICD-10-CM

## 2016-07-12 DIAGNOSIS — M79672 Pain in left foot: Secondary | ICD-10-CM

## 2016-07-12 DIAGNOSIS — B351 Tinea unguium: Secondary | ICD-10-CM

## 2016-07-12 NOTE — Progress Notes (Signed)
  Patient ID: [name], female   DOB: 03-Jan-1927, 83 y.o.   MRN: [id number]  Subjective: [name] is a 83 y.o. female patient seen today in office with complaint of painful thickened and elongated toenails; unable to trim. Patient denies any changes with medical history since last visit. Patient has no other pedal complaints at this time.   Patient Active Problem List   Diagnosis Date Noted  . Acute sinus infection 10/14/2015  . Left otitis externa 10/14/2015  . Carpal tunnel syndrome 07/26/2015  . Advanced care planning/counseling discussion 07/17/2013  . Chronic low back pain 06/18/2012  . Routine health maintenance 06/18/2011  . COPD, mild ([place]) 11/21/2007  . Hyperlipidemia 05/29/2007  . Essential hypertension 05/29/2007  . ALLERGIC RHINITIS 05/29/2007   Current Outpatient Prescriptions on File Prior to Visit  Medication Sig Dispense Refill  . albuterol (PROVENTIL HFA;VENTOLIN HFA) 108 (90 Base) MCG/ACT inhaler Inhale 2 puffs into the lungs every 6 (six) hours as needed for wheezing or shortness of breath. 1 Inhaler 5  . amLODipine (NORVASC) 10 MG tablet Take 1 tablet (10 mg total) by mouth daily. 90 tablet 3  . fluticasone furoate-vilanterol (BREO ELLIPTA) 100-25 MCG/INH AEPB Inhale 1 puff into the lungs daily. 30 each 1  . ketoconazole (NIZORAL) 2 % cream Apply 1 application topically daily. (Patient not taking: Reported on 06/21/2016) 30 g 0  . Multiple Vitamins-Minerals (CENTRUM SILVER) tablet Take 1 tablet by mouth daily.    Marland Kitchen triamterene-hydrochlorothiazide (MAXZIDE-25) 37.5-25 MG tablet Take 1 tablet by mouth daily. (Patient not taking: Reported on 06/21/2016) 90 tablet 3   No current facility-administered medications on file prior to visit.    Allergies  Allergen Reactions  . Codeine     Objective: Physical Exam  General: Well developed, nourished, no acute distress, awake, alert and oriented x 3  Vascular: Dorsalis pedis artery 1/4 bilateral, Posterior  tibial artery 1/4 bilateral, skin temperature warm to warm proximal to distal bilateral lower extremities, mild varicosities, pedal hair present bilateral.  Neurological: Gross sensation present via light touch bilateral.   Dermatological: Skin is warm, dry, and supple bilateral, Nails 1-10 are tender, long, thick, and discolored with mild subungal debris, no webspace macerations present bilateral, no open lesions present bilateral, no callus/corns/hyperkeratotic tissue present bilateral. No signs of infection bilateral.  Musculoskeletal: Asymptomatic hammertoe deformities noted bilateral. Muscular strength within normal limits without pain on range of motion. No pain with calf compression bilateral.  Assessment and Plan:  Problem List Items Addressed This Visit    None    Visit Diagnoses    Dermatophytosis of nail    -  Primary   Foot pain, bilateral         -Examined patient.  -Discussed treatment options for painful mycotic nails. -Mechanically debrided and reduced mycotic nails with sterile nail nipper and dremel nail file without incident. -Recommend continue with vinegar soaks -Patient to return in 3 months for follow up evaluation or sooner if symptoms worsen.  Landis Martins, DPM

## 2016-07-21 ENCOUNTER — Encounter: Payer: Self-pay | Admitting: Internal Medicine

## 2016-07-21 ENCOUNTER — Ambulatory Visit (INDEPENDENT_AMBULATORY_CARE_PROVIDER_SITE_OTHER): Payer: Medicare Other | Admitting: Internal Medicine

## 2016-07-21 DIAGNOSIS — J449 Chronic obstructive pulmonary disease, unspecified: Secondary | ICD-10-CM

## 2016-07-21 MED ORDER — TRIAMTERENE-HCTZ 37.5-25 MG PO TABS: 1.0000 | ORAL_TABLET | Freq: Every day | ORAL | 3 refills | Status: DC

## 2016-07-21 MED ORDER — AMLODIPINE BESYLATE 10 MG PO TABS: 10.0000 mg | ORAL_TABLET | Freq: Every day | ORAL | 3 refills | Status: DC

## 2016-07-21 NOTE — Assessment & Plan Note (Signed)
No flare today, she will finish current breo inhaler and then stop. Still has albuterol prn to use.

## 2016-07-21 NOTE — Patient Instructions (Addendum)
Finish that inhaler and then try to stay off it and see how the breathing does.

## 2016-07-21 NOTE — Progress Notes (Signed)
   Subjective:    Patient ID: [name], female    DOB: [birth year], 83 y.o.   MRN: [id number]  HPI The patient is an 83 YO female coming in for follow up of SOB and fatigue. We treated her with breo and this did help her. She is still taking the breo and is feeling much better. She is able to do more and not getting SOB. No more fevers or chills. Cough is improving a lot.   Review of Systems  Constitutional: Negative for activity change, appetite change, fatigue, fever and unexpected weight change.  HENT: Negative.   Respiratory: Positive for cough. Negative for chest tightness, shortness of breath and wheezing.        Improving  Cardiovascular: Negative.   Gastrointestinal: Negative.   Musculoskeletal: Negative.   Skin: Negative.       Objective:   Physical Exam  Constitutional: She is oriented to person, place, and time. She appears well-developed and well-nourished.  HENT:  Head: Normocephalic and atraumatic.  Right Ear: External ear normal.  Left Ear: External ear normal.  Eyes: EOM are normal.  Neck: Normal range of motion.  Cardiovascular: Normal rate and regular rhythm.   Pulmonary/Chest: Effort normal and breath sounds normal. No respiratory distress. She has no wheezes. She has no rales.  Abdominal: Soft.  Neurological: She is alert and oriented to person, place, and time.  Skin: Skin is warm and dry.   Vitals:   07/21/16 1438  BP: (!) 170/80  Pulse: (!) 101  Temp: 98.2 F (36.8 C)  TempSrc: Oral  SpO2: 95%  Weight: 131 lb 8 oz (59.6 kg)  Height: 5\' 4"  (1.626 m)      Assessment & Plan:

## 2016-07-21 NOTE — Progress Notes (Signed)
Pre visit review using our clinic review tool, if applicable. No additional management support is needed unless otherwise documented below in the visit note. 

## 2016-09-08 ENCOUNTER — Telehealth: Payer: Self-pay | Admitting: Internal Medicine

## 2016-09-08 NOTE — Telephone Encounter (Signed)
Called patient to schedule awv. Lvm for patient to call office to schedule appt.  °

## 2016-09-08 NOTE — Telephone Encounter (Signed)
Patient called to schedule awv. Appt has been scheduled for 09/19/2016.

## 2016-09-19 ENCOUNTER — Ambulatory Visit (INDEPENDENT_AMBULATORY_CARE_PROVIDER_SITE_OTHER): Payer: Medicare Other | Admitting: *Deleted

## 2016-09-19 VITALS — BP 136/72 | HR 93 | Resp 20 | Ht 64.0 in | Wt 131.0 lb

## 2016-09-19 DIAGNOSIS — Z0000 Encounter for general adult medical examination without abnormal findings: Secondary | ICD-10-CM

## 2016-09-19 NOTE — Progress Notes (Signed)
Pre visit review using our clinic review tool, if applicable. No additional management support is needed unless otherwise documented below in the visit note. 

## 2016-09-19 NOTE — Patient Instructions (Signed)
Continue to eat heart healthy diet (full of fruits, vegetables, whole grains, lean protein, water--limit salt, fat, and sugar intake) and increase physical activity as tolerated.  Continue doing brain stimulating activities (puzzles, reading, adult coloring books, staying active) to keep memory sharp.   Check with your daughter and make an appointment with Dr. Sharlet [place].  Try to increase the amount of water you drink daily  Shoulder Range of Motion Exercises Shoulder range of motion (ROM) exercises are designed to keep the shoulder moving freely. They are often recommended for people who have shoulder pain. Phase 1 exercises When you are able, do this exercise 5-6 days per week, or as told by your health care provider. Work toward doing 2 sets of 10 swings. Pendulum Exercise  How To Do This Exercise Lying Down 1. Lie face-down on a bed with your abdomen close to the side of the bed. 2. Let your arm hang over the side of the bed. 3. Relax your shoulder, arm, and hand. 4. Slowly and gently swing your arm forward and back. Do not use your neck muscles to swing your arm. They should be relaxed. If you are struggling to swing your arm, have someone gently swing it for you. When you do this exercise for the first time, swing your arm at a 15 degree angle for 15 seconds, or swing your arm 10 times. As pain lessens over time, increase the angle of the swing to 30-45 degrees. 5. Repeat steps 1-4 with the other arm. How To Do This Exercise While Standing 1. Stand next to a sturdy chair or table and hold on to it with your hand. 1. Bend forward at the waist. 2. Bend your knees slightly. 3. Relax your other arm and let it hang limp. 4. Relax the shoulder blade of the arm that is hanging and let it drop. 5. While keeping your shoulder relaxed, use body motion to swing your arm in small circles. The first time you do this exercise, swing your arm for about 30 seconds or 10 times. When you do it next time,  swing your arm for a little longer. 6. Stand up tall and relax. 7. Repeat steps 1-7, this time changing the direction of the circles. 2. Repeat steps 1-8 with the other arm. Phase 2 exercises Do these exercises 3-4 times per day on 5-6 days per week or as told by your health care provider. Work toward holding the stretch for 20 seconds. Stretching Exercise 1  1. Lift your arm straight out in front of you. 2. Bend your arm 90 degrees at the elbow (right angle) so your forearm goes across your body and looks like the letter "L." 3. Use your other arm to gently pull the elbow forward and across your body. 4. Repeat steps 1-3 with the other arm. Stretching Exercise 2  You will need a towel or rope for this exercise. 1. Bend one arm behind your back with the palm facing outward. 2. Hold a towel with your other hand. 3. Reach the arm that holds the towel above your head, and bend that arm at the elbow. Your wrist should be behind your neck. 4. Use your free hand to grab the free end of the towel. 5. With the higher hand, gently pull the towel up behind you. 6. With the lower hand, pull the towel down behind you. 7. Repeat steps 1-6 with the other arm. Phase 3 exercises Do each of these exercises at four different times of day (  sessions) every day or as told by your health care provider. To begin with, repeat each exercise 5 times (repetitions). Work toward doing 3 sets of 12 repetitions or as told by your health care provider. Strengthening Exercise 1  You will need a light weight for this activity. As you grow stronger, you may use a heavier weight. 1. Standing with a weight in your hand, lift your arm straight out to the side until it is at the same height as your shoulder. 2. Bend your arm at 90 degrees so that your fingers are pointing to the ceiling. 3. Slowly raise your hand until your arm is straight up in the air. 4. Repeat steps 1-3 with the other arm. Strengthening Exercise 2  You  will need a light weight for this activity. As you grow stronger, you may use a heavier weight. 1. Standing with a weight in your hand, gradually move your straight arm in an arc, starting at your side, then out in front of you, then straight up over your head. 2. Gradually move your other arm in an arc, starting at your side, then out in front of you, then straight up over your head. 3. Repeat steps 1-2 with the other arm. Strengthening Exercise 3  You will need an elastic band for this activity. As you grow stronger, gradually increase the size of the bands or increase the number of bands that you use at one time. 1. While standing, hold an elastic band in one hand and raise that arm up in the air. 2. With your other hand, pull down the band until that hand is by your side. 3. Repeat steps 1-2 with the other arm. This information is not intended to replace advice given to you by your health care provider. Make sure you discuss any questions you have with your health care provider. Document Released: 02/26/2003 Document Revised: 01/24/2016 Document Reviewed: 05/26/2014 Elsevier Interactive Patient Education  2017 [place].   Ms. [place] , Thank you for taking time to come for your Medicare Wellness Visit. I appreciate your ongoing commitment to your health goals. Please review the following plan we discussed and let me know if I can assist you in the future.   These are the goals we discussed: Goals      Patient Stated   . patient (pt-stated)          To maintain health       Other   . Sing in the choir at [employer] my voice back, I love to sing in the choir at church       This is a list of the screening recommended for you and due dates:  Health Maintenance  Topic Date Due  . DEXA scan (bone density measurement)  02/15/1994  . Flu Shot  01/11/2017  . Tetanus Vaccine  06/18/2022  . Pneumonia vaccines  Completed

## 2016-09-19 NOTE — Progress Notes (Signed)
Subjective:   [name] is a 83 y.o. female who presents for Medicare Annual (Subsequent) preventive examination. Patient was frustrated as she thought she was seeing Dr. Sharlet Richard today. Nurse apologized for the confusion and encouraged patient to make a future appointment to see Dr. Sharlet Richard. Patient stated that she would like her daughter to attend the visit with her because she is a Marine scientist. Declined making an appointment today. States she will call back and make an appointment on one of her daughter's days off.  Review of Systems:  No ROS.  Medicare Wellness Visit.  Cardiac Risk Factors include: advanced age (>56men, >56 women);dyslipidemia;sedentary lifestyle;family history of premature cardiovascular disease Sleep patterns: feels rested on waking, gets up 1 times nightly to void and sleeps 6-8 hours nightly.   Home Safety/Smoke Alarms: Feels safe in home. Smoke alarms in place.    Living environment; residence and Firearm Safety: 1-story house/ trailer, no firearms. Lives alone, has family support, reports no needs for DME Seat Belt Safety/Bike Helmet: Wears seat belt.   Counseling:   Eye Exam- yearly, Dr. [facility]-   Female:   Pap- N/A       Mammo- N/A, aged out      Dexa scan- Patient declines referral, education provided        CCS- N/A, aged out    Objective:     Vitals: BP 136/72   Pulse 93   Resp 20   Ht 5\' 4"  (1.626 m)   Wt 131 lb (59.4 kg)   SpO2 97%   BMI 22.49 kg/m   Body mass index is 22.49 kg/m.   Tobacco History  Smoking Status  . Former Smoker  . Quit date: 06/14/1980  Smokeless Tobacco  . Never Used     Counseling given: Not Answered   Past Medical History:  Diagnosis Date  . ALLERGIC RHINITIS 05/29/2007  . CHRONIC OBSTRUCTIVE PULMONARY DISEASE, ACUTE EXACERBATION 11/21/2007  . COLONIC POLYPS, HX OF 11/21/2007  . COPD 11/21/2007  . DIVERTICULOSIS, COLON 11/21/2007  . HIP PAIN, LEFT, CHRONIC 06/01/2010  . HYPERLIPIDEMIA  05/29/2007  . HYPERTENSION 05/29/2007  . Hypopotassemia 06/12/2007  . HYSTERECTOMY, HX OF 05/29/2007  . Other acquired absence of organ 05/29/2007  . Other postprocedural status(V45.89) 05/29/2007  . RADICULOPATHY 05/29/2007  . REACTIVE AIRWAY DISEASE 05/29/2007   Past Surgical History:  Procedure Laterality Date  . ABDOMINAL HYSTERECTOMY    . APPENDECTOMY    . CATARACT EXTRACTION W/ INTRAOCULAR LENS IMPLANT Bilateral 05/2015   around december 9th and 30   . DILATION AND CURETTAGE OF UTERUS    . LIPOMA EXCISION    . LUMBAR LAMINECTOMY    . TONSILLECTOMY     Family History  Problem Relation Age of Onset  . Arthritis Mother   . Heart disease Mother   . Uterine cancer Daughter   . Cancer Daughter     ovarian  . Heart disease Father   . Diabetes Neg Hx   . COPD Neg Hx   . Stroke Neg Hx   . Kidney disease Neg Hx    History  Sexual Activity  . Sexual activity: No    Outpatient Encounter Prescriptions as of 09/19/2016  Medication Sig  . albuterol (PROVENTIL HFA;VENTOLIN HFA) 108 (90 Base) MCG/ACT inhaler Inhale 2 puffs into the lungs every 6 (six) hours as needed for wheezing or shortness of breath.  Marland Kitchen amLODipine (NORVASC) 10 MG tablet Take 1 tablet (10 mg total) by mouth daily.  Marland Kitchen ketoconazole (  NIZORAL) 2 % cream Apply 1 application topically daily.  . Multiple Vitamins-Minerals (CENTRUM SILVER) tablet Take 1 tablet by mouth daily.  . naproxen sodium (ANAPROX) 220 MG tablet Take 220 mg by mouth 2 (two) times daily with a meal.  . [DISCONTINUED] fluticasone furoate-vilanterol (BREO ELLIPTA) 100-25 MCG/INH AEPB Inhale 1 puff into the lungs daily. (Patient not taking: Reported on 09/19/2016)  . [DISCONTINUED] triamterene-hydrochlorothiazide (MAXZIDE-25) 37.5-25 MG tablet Take 1 tablet by mouth daily. (Patient not taking: Reported on 09/19/2016)   No facility-administered encounter medications on file as of 09/19/2016.    Activities of Daily Living In your present state of health, do  you have any difficulty performing the following activities: 09/19/2016  Hearing? N  Vision? N  Difficulty concentrating or making decisions? N  Walking or climbing stairs? N  Dressing or bathing? N  Doing errands, shopping? N  Preparing Food and eating ? N  Using the Toilet? N  In the past six months, have you accidently leaked urine? N  Do you have problems with loss of bowel control? N  Managing your Medications? N  Managing your Finances? N  Housekeeping or managing your Housekeeping? N  Some recent data might be hidden    Patient Care Team: Hoyt Koch, MD as PCP - General (Internal Medicine)    Assessment:    Physical assessment deferred to PCP.  Exercise Activities and Dietary recommendations Current Exercise Habits: The patient does not participate in regular exercise at present, Exercise limited by: orthopedic condition(s) Diet (meal preparation, eat out, water intake, caffeinated beverages, dairy products, fruits and vegetables): in general, a "healthy" diet  , well balanced, low fat/ cholesterol, low salt  Patient limits caffeine and sugar drinks, drinks 1 bottle of water daily.  Discussed and encouraged heart healthy diet, encouraged patient to increase water intake.  Goals      Patient Stated   . patient (pt-stated)          To maintain health       Other   . Sing in the choir at [employer] my voice back, I love to sing in the choir at [place]  09/19/2016 07/24/2015  Falls in the past year? Yes No  Number falls in past yr: 1 -  Risk for fall due to : Impaired balance/gait;Impaired mobility -   Depression Screen PHQ 2/9 Scores 09/19/2016 07/24/2015  PHQ - 2 Score 1 0     Cognitive Function MMSE - Mini Mental State Exam 09/19/2016 07/24/2015  Not completed: - (No Data)  Orientation to time 5 -  Orientation to Place 5 -  Registration 3 -  Attention/ Calculation 4 -  Recall 1 -  Language- name 2 objects 2 -    Language- repeat 1 -  Language- follow 3 step command 3 -  Language- read & follow direction 1 -  Write a sentence 1 -  Copy design 1 -  Total score 27 -        Immunization History  Administered Date(s) Administered  . Influenza, High Dose Seasonal PF 06/21/2016  . Pneumococcal Conjugate-13 07/24/2015  . Pneumococcal Polysaccharide-23 05/30/2007  . Tetanus 06/18/2012   Screening Tests Health Maintenance  Topic Date Due  . DEXA SCAN  02/15/1994  . INFLUENZA VACCINE  01/11/2017  . TETANUS/TDAP  06/18/2022  . PNA vac Low Risk Adult  Completed  Plan:     Continue to eat heart healthy diet (full of fruits, vegetables, whole grains, lean protein, water--limit salt, fat, and sugar intake) and increase physical activity as tolerated.  Continue doing brain stimulating activities (puzzles, reading, adult coloring books, staying active) to keep memory sharp.   Check with your daughter and make an appointment with Dr. Sharlet Richard.  Try to increase the amount of water you drink daily  ROM shoulder exercises provided to help with shoulder stifness  During the course of the visit the patient was educated and counseled about the following appropriate screening and preventive services:   Vaccines to include Pneumoccal, Influenza, Hepatitis B, Td, Zostavax, HCV  Cardiovascular Disease  Colorectal cancer screening  Bone density screening  Diabetes screening  Glaucoma screening  Mammography/PAP  Nutrition counseling   Patient Instructions (the written plan) was given to the patient.   Michiel Cowboy, RN  09/19/2016

## 2016-09-20 NOTE — Progress Notes (Signed)
Medical screening examination/treatment/procedure(s) were performed by non-physician practitioner and as supervising physician I was immediately available for consultation/collaboration. I agree with above. Elizabeth A Crawford, MD 

## 2016-09-29 ENCOUNTER — Encounter: Payer: Self-pay | Admitting: Internal Medicine

## 2016-09-29 ENCOUNTER — Ambulatory Visit (INDEPENDENT_AMBULATORY_CARE_PROVIDER_SITE_OTHER): Payer: Medicare Other | Admitting: Internal Medicine

## 2016-09-29 DIAGNOSIS — J301 Allergic rhinitis due to pollen: Secondary | ICD-10-CM

## 2016-09-29 DIAGNOSIS — I10 Essential (primary) hypertension: Secondary | ICD-10-CM

## 2016-09-29 DIAGNOSIS — G5601 Carpal tunnel syndrome, right upper limb: Secondary | ICD-10-CM

## 2016-09-29 MED ORDER — OLOPATADINE HCL 0.1 % OP SOLN: 1.0000 [drp] | Freq: Two times a day (BID) | OPHTHALMIC | 12 refills | Status: DC

## 2016-09-29 NOTE — Progress Notes (Signed)
   Subjective:    Patient ID: [name], female    DOB: Aug 23, 1926, 83 y.o.   MRN: [id number]  HPI The patient is an 83 YO female coming in for several concerns including blood pressure (she has been taking amlodipine for many years, taken off hctz some time ago due to low BP and wants to know if she should resume, she is urinating more off the medicine and wonders if this is related, she denies headaches, chest pains, SOB, she checks BP at home and typically in the 130s/70s at home), and her allergies (she is having red eye, running eye, itching of her eyes all in the last several weeks, she does not like to take otc allergy medication as it makes her drowsy, she can live with drainage but her eyes are really bothering her, has not tried anything otc), as well as her numbness in the 1-3 fingers on her right hand, bothers her more during the day, certain activities make it worse, she has not tried anything for it, denies weakness in the hand, no numbness or pain outside of the hand, no pain in the neck or shoulders).   Review of Systems  Constitutional: Positive for activity change. Negative for appetite change, diaphoresis, fatigue, fever and unexpected weight change.  HENT: Positive for congestion and postnasal drip. Negative for drooling, ear discharge, ear pain, rhinorrhea, sinus pain, sinus pressure, sore throat and trouble swallowing.   Eyes: Positive for photophobia, redness and itching. Negative for pain, discharge and visual disturbance.  Respiratory: Negative.   Cardiovascular: Negative.   Gastrointestinal: Negative.   Musculoskeletal: Negative.   Skin: Negative.   Neurological: Positive for numbness. Negative for dizziness, tremors, weakness, light-headedness and headaches.  Psychiatric/Behavioral: Negative.       Objective:   Physical Exam  Constitutional: She is oriented to person, place, and time. She appears well-developed and well-nourished.  HENT:  Head: Normocephalic  and atraumatic.  Eyes: EOM are normal. Pupils are equal, round, and reactive to light.  Redness around the eye lids with some stigmata of scratching, conjunctivae clear and PERRLA  Neck: Normal range of motion.  Cardiovascular: Normal rate and regular rhythm.   Pulmonary/Chest: Effort normal and breath sounds normal. No respiratory distress. She has no wheezes. She has no rales.  Abdominal: Soft. She exhibits no distension. There is no tenderness. There is no rebound.  Musculoskeletal: She exhibits no edema.  Neurological: She is alert and oriented to person, place, and time.  Skin: Skin is warm and dry.   Vitals:   09/29/16 1414  BP: (!) 150/90  Pulse: 94  Resp: 14  Temp: 98.2 F (36.8 C)  TempSrc: Oral  SpO2: 98%  Weight: 131 lb (59.4 kg)  Height: 5\' 4"  (1.626 m)      Assessment & Plan:

## 2016-09-29 NOTE — Patient Instructions (Signed)
We have sent in some eye drops which you can use twice a day for the itching.   It is okay to keep taking the medicines as you are.   Try a carpal tunnel brace to wear at night time on the right hand to see if this helps with the numbness.

## 2016-09-29 NOTE — Progress Notes (Signed)
Pre visit review using our clinic review tool, if applicable. No additional management support is needed unless otherwise documented below in the visit note. 

## 2016-09-30 NOTE — Assessment & Plan Note (Signed)
BP at goal on amlodipine, she can remain off hctz. Informed her that hctz causes more urine production and talked to her about water intake and certain food intake which can lead to frequent urination as well as bladder changes with aging which can lead to frequent urination which are more likely to be causing this for her. She declines medication for this problem at this time.

## 2016-09-30 NOTE — Assessment & Plan Note (Signed)
She has not tried the wrist brace as recommended. We again ask her to try otc wrist brace first to see if this is able to help with her symptoms. She is interested in finding the cause. Offered nerve conduction testing to her and she declines at this time after hearing about the process.

## 2016-09-30 NOTE — Assessment & Plan Note (Signed)
Rx for patanol eye drops for the itching and redness and asked her to stop scratching around her eyes if possible. She will work on that. She is also advised that she can use otc allergy medications if the symptoms worsen.

## 2016-10-11 ENCOUNTER — Ambulatory Visit (INDEPENDENT_AMBULATORY_CARE_PROVIDER_SITE_OTHER): Payer: Medicare Other | Admitting: Sports Medicine

## 2016-10-11 ENCOUNTER — Encounter: Payer: Self-pay | Admitting: Sports Medicine

## 2016-10-11 DIAGNOSIS — M79671 Pain in right foot: Secondary | ICD-10-CM

## 2016-10-11 DIAGNOSIS — M79672 Pain in left foot: Secondary | ICD-10-CM

## 2016-10-11 DIAGNOSIS — B351 Tinea unguium: Secondary | ICD-10-CM

## 2016-10-11 NOTE — Progress Notes (Signed)
  Patient ID: [name], female   DOB: 1926-12-30, 83 y.o.   MRN: [id number]  Subjective: [name] is a 83 y.o. female patient seen today in office with complaint of painful thickened and elongated toenails; unable to trim. Patient denies any changes with medical history since last visit. Patient has no other pedal complaints at this time.   Patient Active Problem List   Diagnosis Date Noted  . Carpal tunnel syndrome of right wrist 07/26/2015  . Advanced care planning/counseling discussion 07/17/2013  . Chronic low back pain 06/18/2012  . Routine health maintenance 06/18/2011  . COPD, mild ([place]) 11/21/2007  . Hyperlipidemia 05/29/2007  . Essential hypertension 05/29/2007  . Allergic rhinitis 05/29/2007   Current Outpatient Prescriptions on File Prior to Visit  Medication Sig Dispense Refill  . albuterol (PROVENTIL HFA;VENTOLIN HFA) 108 (90 Base) MCG/ACT inhaler Inhale 2 puffs into the lungs every 6 (six) hours as needed for wheezing or shortness of breath. 1 Inhaler 5  . amLODipine (NORVASC) 10 MG tablet Take 1 tablet (10 mg total) by mouth daily. 90 tablet 3  . ketoconazole (NIZORAL) 2 % cream Apply 1 application topically daily. 30 g 0  . Multiple Vitamins-Minerals (CENTRUM SILVER) tablet Take 1 tablet by mouth daily.    . naproxen sodium (ANAPROX) 220 MG tablet Take 220 mg by mouth 2 (two) times daily with a meal.    . olopatadine (PATANOL) 0.1 % ophthalmic solution Place 1 drop into both eyes 2 (two) times daily. 5 mL 12   No current facility-administered medications on file prior to visit.    Allergies  Allergen Reactions  . Prednisone     Patient became jittery all over  . Codeine     Objective: Physical Exam  General: Well developed, nourished, no acute distress, awake, alert and oriented x 3  Vascular: Dorsalis pedis artery 1/4 bilateral, Posterior tibial artery 1/4 bilateral, skin temperature warm to warm proximal to distal bilateral lower extremities,  mild varicosities, pedal hair present bilateral.  Neurological: Gross sensation present via light touch bilateral.   Dermatological: Skin is warm, dry, and supple bilateral, Nails 1-10 are tender, long, thick, and discolored with mild subungal debris, no webspace macerations present bilateral, no open lesions present bilateral, no callus/corns/hyperkeratotic tissue present bilateral. No signs of infection bilateral.  Musculoskeletal: Asymptomatic hammertoe deformities noted bilateral. Muscular strength within normal limits without pain on range of motion. No pain with calf compression bilateral.  Assessment and Plan:  Problem List Items Addressed This Visit    None    Visit Diagnoses    Dermatophytosis of nail    -  Primary   Foot pain, bilateral         -Examined patient.  -Discussed treatment options for painful mycotic nails. -Mechanically debrided and reduced mycotic nails with sterile nail nipper and dremel nail file without incident. -Recommend continue with vinegar soaks as needed  -Patient to return in 3 months for follow up evaluation or sooner if symptoms worsen.  Landis Martins, DPM

## 2017-01-17 ENCOUNTER — Encounter: Payer: Self-pay | Admitting: Sports Medicine

## 2017-01-17 ENCOUNTER — Ambulatory Visit (INDEPENDENT_AMBULATORY_CARE_PROVIDER_SITE_OTHER): Payer: Medicare Other | Admitting: Sports Medicine

## 2017-01-17 DIAGNOSIS — B351 Tinea unguium: Secondary | ICD-10-CM | POA: Diagnosis not present

## 2017-01-17 DIAGNOSIS — M79672 Pain in left foot: Secondary | ICD-10-CM | POA: Diagnosis not present

## 2017-01-17 DIAGNOSIS — I739 Peripheral vascular disease, unspecified: Secondary | ICD-10-CM

## 2017-01-17 DIAGNOSIS — M79671 Pain in right foot: Secondary | ICD-10-CM

## 2017-01-17 NOTE — Progress Notes (Signed)
  Patient ID: [name], female   DOB: 01/05/1927, 83 y.o.   MRN: [id number]  Subjective: [name] is a 83 y.o. female patient seen today in office with complaint of painful thickened and elongated toenails; unable to trim. Patient denies any changes with medical history since last visit. Patient has no other pedal complaints at this time.   Patient Active Problem List   Diagnosis Date Noted  . Carpal tunnel syndrome of right wrist 07/26/2015  . Advanced care planning/counseling discussion 07/17/2013  . Chronic low back pain 06/18/2012  . Routine health maintenance 06/18/2011  . COPD, mild ([place]) 11/21/2007  . Hyperlipidemia 05/29/2007  . Essential hypertension 05/29/2007  . Allergic rhinitis 05/29/2007   Current Outpatient Prescriptions on File Prior to Visit  Medication Sig Dispense Refill  . albuterol (PROVENTIL HFA;VENTOLIN HFA) 108 (90 Base) MCG/ACT inhaler Inhale 2 puffs into the lungs every 6 (six) hours as needed for wheezing or shortness of breath. 1 Inhaler 5  . amLODipine (NORVASC) 10 MG tablet Take 1 tablet (10 mg total) by mouth daily. 90 tablet 3  . ketoconazole (NIZORAL) 2 % cream Apply 1 application topically daily. 30 g 0  . Multiple Vitamins-Minerals (CENTRUM SILVER) tablet Take 1 tablet by mouth daily.    . naproxen sodium (ANAPROX) 220 MG tablet Take 220 mg by mouth 2 (two) times daily with a meal.    . olopatadine (PATANOL) 0.1 % ophthalmic solution Place 1 drop into both eyes 2 (two) times daily. 5 mL 12   No current facility-administered medications on file prior to visit.    Allergies  Allergen Reactions  . Prednisone     Patient became jittery all over  . Codeine     Objective: Physical Exam  General: Well developed, nourished, no acute distress, awake, alert and oriented x 3  Vascular: Dorsalis pedis artery 1/4 bilateral, Posterior tibial artery 1/4 bilateral, skin temperature warm to warm proximal to distal bilateral lower extremities,  mild varicosities, pedal hair present bilateral.  Neurological: Gross sensation present via light touch bilateral.   Dermatological: Skin is warm, dry, and supple bilateral, Nails 1-10 are tender, long, thick, and discolored with mild subungal debris, no webspace macerations present bilateral, no open lesions present bilateral, no callus/corns/hyperkeratotic tissue present bilateral. No signs of infection bilateral.  Musculoskeletal: Asymptomatic hammertoe deformities noted bilateral. Muscular strength within normal limits without pain on range of motion. No pain with calf compression bilateral.  Assessment and Plan:  Problem List Items Addressed This Visit    None    Visit Diagnoses    Dermatophytosis of nail    -  Primary   Foot pain, bilateral       PVD (peripheral vascular disease) ([place])         -Examined patient.  -Discussed treatment options for painful mycotic nails. -Mechanically debrided and reduced mycotic nails with sterile nail nipper and dremel nail file without incident. -Recommend continue with vinegar soaks as needed  -Patient to return in 3 months for follow up evaluation or sooner if symptoms worsen.  Landis Martins, DPM

## 2017-02-02 ENCOUNTER — Ambulatory Visit (INDEPENDENT_AMBULATORY_CARE_PROVIDER_SITE_OTHER)
Admission: RE | Admit: 2017-02-02 | Discharge: 2017-02-02 | Disposition: A | Discharge status: Home / Self Care | Payer: Medicare Other | Source: Ambulatory Visit | Attending: Internal Medicine | Admitting: Internal Medicine

## 2017-02-02 ENCOUNTER — Other Ambulatory Visit: Payer: Self-pay | Admitting: Internal Medicine

## 2017-02-02 ENCOUNTER — Ambulatory Visit (INDEPENDENT_AMBULATORY_CARE_PROVIDER_SITE_OTHER): Payer: Medicare Other | Admitting: Internal Medicine

## 2017-02-02 ENCOUNTER — Encounter: Payer: Self-pay | Admitting: Internal Medicine

## 2017-02-02 ENCOUNTER — Other Ambulatory Visit (INDEPENDENT_AMBULATORY_CARE_PROVIDER_SITE_OTHER): Payer: Medicare Other

## 2017-02-02 VITALS — BP 178/88 | HR 98 | Temp 98.9°F | Resp 18 | Wt 126.0 lb

## 2017-02-02 DIAGNOSIS — J441 Chronic obstructive pulmonary disease with (acute) exacerbation: Secondary | ICD-10-CM | POA: Diagnosis not present

## 2017-02-02 DIAGNOSIS — R0600 Dyspnea, unspecified: Secondary | ICD-10-CM

## 2017-02-02 DIAGNOSIS — I10 Essential (primary) hypertension: Secondary | ICD-10-CM

## 2017-02-02 DIAGNOSIS — R9431 Abnormal electrocardiogram [ECG] [EKG]: Secondary | ICD-10-CM

## 2017-02-02 LAB — CBC WITH DIFFERENTIAL/PLATELET
Basophils Absolute: 0.1 10*3/uL (ref 0.0–0.1)
Basophils Relative: 1.2 % (ref 0.0–3.0)
EOS ABS: 0.5 10*3/uL (ref 0.0–0.7)
EOS PCT: 5.4 % — AB (ref 0.0–5.0)
HCT: 43.4 % (ref 36.0–46.0)
HEMOGLOBIN: 14.7 g/dL (ref 12.0–15.0)
LYMPHS ABS: 3.2 10*3/uL (ref 0.7–4.0)
Lymphocytes Relative: 33.8 % (ref 12.0–46.0)
MCHC: 33.8 g/dL (ref 30.0–36.0)
MCV: 90.6 fl (ref 78.0–100.0)
MONO ABS: 0.9 10*3/uL (ref 0.1–1.0)
Monocytes Relative: 9.4 % (ref 3.0–12.0)
NEUTROS PCT: 50.2 % (ref 43.0–77.0)
Neutro Abs: 4.8 10*3/uL (ref 1.4–7.7)
Platelets: 333 10*3/uL (ref 150.0–400.0)
RBC: 4.79 Mil/uL (ref 3.87–5.11)
RDW: 13.3 % (ref 11.5–15.5)
WBC: 9.6 10*3/uL (ref 4.0–10.5)

## 2017-02-02 LAB — COMPREHENSIVE METABOLIC PANEL
ALBUMIN: 4.3 g/dL (ref 3.5–5.2)
ALK PHOS: 68 U/L (ref 39–117)
ALT: 9 U/L (ref 0–35)
AST: 18 U/L (ref 0–37)
BUN: 30 mg/dL — AB (ref 6–23)
CO2: 36 mEq/L — ABNORMAL HIGH (ref 19–32)
CREATININE: 1.33 mg/dL — AB (ref 0.40–1.20)
Calcium: 10.4 mg/dL (ref 8.4–10.5)
Chloride: 102 mEq/L (ref 96–112)
GFR: 40.02 mL/min — ABNORMAL LOW (ref >60.00)
GLUCOSE: 98 mg/dL (ref 70–99)
POTASSIUM: 3.7 meq/L (ref 3.5–5.1)
SODIUM: 143 meq/L (ref 135–145)
TOTAL PROTEIN: 7.3 g/dL (ref 6.0–8.3)
Total Bilirubin: 0.6 mg/dL (ref 0.2–1.2)

## 2017-02-02 LAB — BRAIN NATRIURETIC PEPTIDE: PRO B NATRI PEPTIDE: 36 pg/mL (ref 0.0–100.0)

## 2017-02-02 MED ORDER — FLUTICASONE FUROATE-VILANTEROL 100-25 MCG/INH IN AEPB: 1.0000 | INHALATION_SPRAY | Freq: Every day | RESPIRATORY_TRACT | Status: DC

## 2017-02-02 NOTE — Assessment & Plan Note (Signed)
BP Readings from Last 3 Encounters:  02/02/17 (!) 178/88  09/29/16 (!) 150/90  09/19/16 136/72    very elevated today - likely from SOB No change in medication at this time, but may need to change medication in near future

## 2017-02-02 NOTE — Patient Instructions (Signed)
Have blood work and a chest x-ray done today.   Test(s) ordered today. Your results will be released to [place] (or called to you) after review, usually within 72hours after test completion. If any changes need to be made, you will be notified at that same time.  An EKG was done today.   Medications reviewed and updated.  No changes recommended at this time.  Continue the inhalers as prescribed.   An Echo of your heart was ordered.  We will call you with all your results.  If you are feeling worse or not feeling better please let us now.

## 2017-02-02 NOTE — Assessment & Plan Note (Signed)
Having increased SOB - breo helped in the recent past I think she has other reasons for the SOB, but her COPD may be contributing Restart breo Continue albuterol CXR

## 2017-02-02 NOTE — Progress Notes (Signed)
Subjective:    Patient ID: [name], female    DOB: [birth year], 83 y.o.   MRN: [id number]  HPI She is here for an acute visit.   SOB:  She thinks her SOB started with there COPD exacerbation in December.  That was her first COPD exacerbation.  She had PFTS done in 2011 that showed moderately obstructive disease, but she never had symptoms until recently.  She used Breo at that time and albuterol.  The Breo helped and her symptoms improved.  She did follow up with Dr Sharlet Salina and said that she did feel better, but today she states the SOB never went away completely.  Her SOB has been really bad over the past 1-2 weeks.  She is using albterol daily every 6 hrs and it is not lasting that long - she could use it every 4 hrs.  She has not taken the breo - she could get it refilled..     She has had some mild wheeze but denies cough.  She denies cold symptoms.  She has lost some weight and has decreased appetite.  She is more fatigued.  She denies chest pain.      Medications and allergies reviewed with patient and updated if appropriate.  Patient Active Problem List   Diagnosis Date Noted  . Carpal tunnel syndrome of right wrist 07/26/2015  . Advanced care planning/counseling discussion 07/17/2013  . Chronic low back pain 06/18/2012  . Routine health maintenance 06/18/2011  . COPD, mild ([place]) 11/21/2007  . Hyperlipidemia 05/29/2007  . Essential hypertension 05/29/2007  . Allergic rhinitis 05/29/2007    Current Outpatient Prescriptions on File Prior to Visit  Medication Sig Dispense Refill  . albuterol (PROVENTIL HFA;VENTOLIN HFA) 108 (90 Base) MCG/ACT inhaler Inhale 2 puffs into the lungs every 6 (six) hours as needed for wheezing or shortness of breath. 1 Inhaler 5  . amLODipine (NORVASC) 10 MG tablet Take 1 tablet (10 mg total) by mouth daily. 90 tablet 3  . ketoconazole (NIZORAL) 2 % cream Apply 1 application topically daily. 30 g 0  . Multiple Vitamins-Minerals (CENTRUM  SILVER) tablet Take 1 tablet by mouth daily.    . naproxen sodium (ANAPROX) 220 MG tablet Take 220 mg by mouth 2 (two) times daily with a meal.    . olopatadine (PATANOL) 0.1 % ophthalmic solution Place 1 drop into both eyes 2 (two) times daily. 5 mL 12   No current facility-administered medications on file prior to visit.     Past Medical History:  Diagnosis Date  . ALLERGIC RHINITIS 05/29/2007  . CHRONIC OBSTRUCTIVE PULMONARY DISEASE, ACUTE EXACERBATION 11/21/2007  . COLONIC POLYPS, HX OF 11/21/2007  . COPD 11/21/2007  . DIVERTICULOSIS, COLON 11/21/2007  . HIP PAIN, LEFT, CHRONIC 06/01/2010  . HYPERLIPIDEMIA 05/29/2007  . HYPERTENSION 05/29/2007  . Hypopotassemia 06/12/2007  . HYSTERECTOMY, HX OF 05/29/2007  . Other acquired absence of organ 05/29/2007  . Other postprocedural status(V45.89) 05/29/2007  . RADICULOPATHY 05/29/2007  . REACTIVE AIRWAY DISEASE 05/29/2007    Past Surgical History:  Procedure Laterality Date  . ABDOMINAL HYSTERECTOMY    . APPENDECTOMY    . CATARACT EXTRACTION W/ INTRAOCULAR LENS IMPLANT Bilateral 05/2015   around december 9th and 28   . DILATION AND CURETTAGE OF UTERUS    . LIPOMA EXCISION    . LUMBAR LAMINECTOMY    . TONSILLECTOMY      Social History   Social History  . Marital status: Widowed    Spouse  name: N/A  . Number of children: N/A  . Years of education: N/A   Social History Main Topics  . Smoking status: Former Smoker    Quit date: 06/14/1980  . Smokeless tobacco: Never Used  . Alcohol use No  . Drug use: No  . Sexual activity: No   Other Topics Concern  . None   Social History Narrative   HSG. Married 10-02-48- 54yr/divorced. Married 10/02/1960- widowed 1999/10/03. 2 daughters-'54 (deceased ovarian cancer), 10-03-63. 1 son 02-Oct-2060.  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  no heroic measures; i.e. Feeding tubes, mechanical ventilation    Family History  Problem Relation Age of Onset  . Arthritis  Mother   . Heart disease Mother   . Uterine cancer Daughter   . Cancer Daughter        ovarian  . Heart disease Father   . Diabetes Neg Hx   . COPD Neg Hx   . Stroke Neg Hx   . Kidney disease Neg Hx     Review of Systems  Constitutional: Positive for appetite change (chronic, stable), fatigue and unexpected weight change. Negative for chills and fever.  HENT: Positive for postnasal drip. Negative for congestion, ear pain, sinus pain and sore throat.   Respiratory: Positive for shortness of breath (with exertion,mild at rest) and wheezing (mild). Negative for cough and chest tightness.   Cardiovascular: Positive for leg swelling (left ankle, chronic - no change). Negative for chest pain and palpitations.  Gastrointestinal: Positive for constipation. Negative for abdominal pain, blood in stool, diarrhea and nausea.  Genitourinary: Negative for dysuria and hematuria.  Neurological: Negative for light-headedness and headaches.       Objective:   Vitals:   02/02/17 1429  BP: (!) 178/88  Pulse: 98  Resp: 18  Temp: 98.9 F (37.2 C)  SpO2: 95%   Filed Weights   02/02/17 1429  Weight: 126 lb (57.2 kg)   Body mass index is 21.63 kg/m.  Wt Readings from Last 3 Encounters:  02/02/17 126 lb (57.2 kg)  09/29/16 131 lb (59.4 kg)  09/19/16 131 lb (59.4 kg)     Physical Exam GENERAL APPEARANCE: elderly female with mild SOB with exertion EYES: conjunctiva clear, no icterus HEENT:  no thyromegaly, trachea midline, no cervical or supraclavicular lymphadenopathy LUNGS:  No respiratory distress, good air entry b/l.  No rales or wheeze. Cardiovascular: Normal D7,O2 2/6 systolic and diastolic murmur.  Trace left ankle edema, no right ankle edema. Skin: dry, warm      Assessment & Plan:   See Problem List for Assessment and Plan of chronic medical problems.

## 2017-02-02 NOTE — Assessment & Plan Note (Addendum)
Her dyspnea sounds acute on chronic COPD may be playing a role, but I am concerned she may be having more of a cardiac issue Cbc, cmp, bnp EKG today CXR ECHO asap Will likely need to see cardiology   EKG shows sinus rhythm at 88 bpm, first degree AV block, LAFB, LVH, possible old anterior infarct - last EKG 2011 and anterior infarct is new

## 2017-02-06 ENCOUNTER — Ambulatory Visit (HOSPITAL_COMMUNITY): Payer: Medicare Other | Attending: Cardiology

## 2017-02-06 ENCOUNTER — Other Ambulatory Visit: Payer: Self-pay

## 2017-02-06 DIAGNOSIS — E785 Hyperlipidemia, unspecified: Secondary | ICD-10-CM | POA: Insufficient documentation

## 2017-02-06 DIAGNOSIS — Z87891 Personal history of nicotine dependence: Secondary | ICD-10-CM | POA: Insufficient documentation

## 2017-02-06 DIAGNOSIS — I119 Hypertensive heart disease without heart failure: Secondary | ICD-10-CM | POA: Insufficient documentation

## 2017-02-06 DIAGNOSIS — R0600 Dyspnea, unspecified: Secondary | ICD-10-CM

## 2017-02-06 DIAGNOSIS — J449 Chronic obstructive pulmonary disease, unspecified: Secondary | ICD-10-CM | POA: Insufficient documentation

## 2017-02-06 DIAGNOSIS — Z8249 Family history of ischemic heart disease and other diseases of the circulatory system: Secondary | ICD-10-CM | POA: Diagnosis not present

## 2017-03-08 ENCOUNTER — Encounter: Payer: Self-pay | Admitting: Cardiology

## 2017-03-16 ENCOUNTER — Ambulatory Visit (INDEPENDENT_AMBULATORY_CARE_PROVIDER_SITE_OTHER): Payer: Medicare Other | Admitting: Cardiology

## 2017-03-16 ENCOUNTER — Encounter: Payer: Self-pay | Admitting: Cardiology

## 2017-03-16 VITALS — BP 174/72 | HR 98 | Ht 64.0 in | Wt 129.6 lb

## 2017-03-16 DIAGNOSIS — I10 Essential (primary) hypertension: Secondary | ICD-10-CM | POA: Diagnosis not present

## 2017-03-16 DIAGNOSIS — R0602 Shortness of breath: Secondary | ICD-10-CM

## 2017-03-16 NOTE — Progress Notes (Signed)
Cardiology Office Note    Date:  03/16/2017   ID:  MALCOLM HETZ, DOB 1927-01-27, MRN [id number]  PCP:  Hoyt Koch, MD  Cardiologist:  Fransico Him, MD   Chief Complaint  Patient presents with  . Shortness of Breath    History of Present Illness:  [name] is a 83 y.o. female who is being seen today for the evaluation of SOB at the request of Burns, Stacy J, MD.  This is an 83yo female with a hx of COPD, hyperlipidemia and HTN who had a COPD exacerbation in December and since then has had persistent SOB.  PFTs in 2011 showed moderate obstructive disease but never had symptoms until this past year.  Recently her SOB has become worse and her PCP felt that she likely was having an acute on chronic COPD exacerbation but also concerned about a cardiac etiology.  She says that the SOB comes and goes and she takes an inhaler which helps with her symptoms.  She says that she is fairly sedentary  So does notice some DOE more than at rest but her daughter states that there are times she will wake up SOB at night although no significant PND.  She sleeps in a recliner at night for 2 years.  She has chronic ankle edema in the left leg.  She denies any chest pain or pressure, palpitations, dizziness or syncope.  She used to smoke years ago but no longer.  2D echo showed normal LVF with increased stiffness of heart muscle normal for her age.     Past Medical History:  Diagnosis Date  . ALLERGIC RHINITIS 05/29/2007  . CHRONIC OBSTRUCTIVE PULMONARY DISEASE, ACUTE EXACERBATION 11/21/2007  . COLONIC POLYPS, HX OF 11/21/2007  . COPD 11/21/2007  . DIVERTICULOSIS, COLON 11/21/2007  . HIP PAIN, LEFT, CHRONIC 06/01/2010  . HYPERLIPIDEMIA 05/29/2007  . HYPERTENSION 05/29/2007  . Hypopotassemia 06/12/2007  . HYSTERECTOMY, HX OF 05/29/2007  . Other acquired absence of organ 05/29/2007  . Other postprocedural status(V45.89) 05/29/2007  . RADICULOPATHY 05/29/2007  . REACTIVE AIRWAY DISEASE  05/29/2007    Past Surgical History:  Procedure Laterality Date  . ABDOMINAL HYSTERECTOMY    . APPENDECTOMY    . CATARACT EXTRACTION W/ INTRAOCULAR LENS IMPLANT Bilateral 05/2015   around december 9th and 28   . DILATION AND CURETTAGE OF UTERUS    . LIPOMA EXCISION    . LUMBAR LAMINECTOMY    . TONSILLECTOMY      Current Medications: Current Meds  Medication Sig  . albuterol (PROVENTIL HFA;VENTOLIN HFA) 108 (90 Base) MCG/ACT inhaler Inhale 2 puffs into the lungs every 6 (six) hours as needed for wheezing or shortness of breath.  Marland Kitchen amLODipine (NORVASC) 10 MG tablet Take 1 tablet (10 mg total) by mouth daily.  . fluticasone furoate-vilanterol (BREO ELLIPTA) 100-25 MCG/INH AEPB Inhale 1 puff into the lungs daily.  . Multiple Vitamins-Minerals (CENTRUM SILVER) tablet Take 1 tablet by mouth daily.  . naproxen sodium (ANAPROX) 220 MG tablet Take 220 mg by mouth 2 (two) times daily with a meal.    Allergies:   Prednisone and Codeine   Social History   Social History  . Marital status: Widowed    Spouse name: N/A  . Number of children: N/A  . Years of education: N/A   Social History Main Topics  . Smoking status: Former Smoker    Quit date: 06/14/1980  . Smokeless tobacco: Never Used  . Alcohol use No  . Drug  use: No  . Sexual activity: No   Other Topics Concern  . None   Social History Narrative   HSG. Married 09-23-2048- 15yr/divorced. Married 09/23/60- widowed 09/24/1999. 2 daughters-'54 (deceased ovarian cancer), 09/24/63. 1 son 09-23-2060.  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  no heroic measures; i.e. Feeding tubes, mechanical ventilation     Family History:  The patient's family history includes Arthritis in her mother; Cancer in her daughter; Heart disease in her father and mother; Uterine cancer in her daughter.   ROS:   Please see the history of present illness.    ROS All other systems reviewed and are negative.  PAD Screen 03/16/2017    Previous PAD dx? No  Previous surgical procedure? No  Pain with walking? No  Feet/toe relief with dangling? No  Painful, non-healing ulcers? No  Extremities discolored? No       PHYSICAL EXAM:   VS:  BP (!) 174/72   Pulse 98   Ht 5\' 4"  (1.626 m)   SpO2 95%    GEN: Well nourished, well developed, in no acute distress  HEENT: normal  Neck: no JVD, carotid bruits, or masses Cardiac:RRR; no murmurs, rubs, or gallops,no edema.  Intact distal pulses bilaterally.  Respiratory:  clear to auscultation bilaterally, normal work of breathing GI: soft, nontender, nondistended, + BS MS: no deformity or atrophy  Skin: warm and dry, no rash Neuro:  Alert and Oriented x 3, Strength and sensation are intact Psych: euthymic mood, full affect  Wt Readings from Last 3 Encounters:  02/02/17 126 lb (57.2 kg)  09/29/16 131 lb (59.4 kg)  09/19/16 131 lb (59.4 kg)      Studies/Labs Reviewed:   EKG:  EKG is not ordered today.    Recent Labs: 02/02/2017: ALT 9; BUN 30; Creatinine, Ser 1.33; Hemoglobin 14.7; Platelets 333.0; Potassium 3.7; Pro B Natriuretic peptide (BNP) 36.0; Sodium 143   Lipid Panel    Component Value Date/Time   CHOL 242 (H) 07/24/2015 1403   TRIG 309.0 (H) 07/24/2015 1403   TRIG 434 (HH) 03/23/2006 1539   HDL 56.70 07/24/2015 1403   CHOLHDL 4 07/24/2015 1403   VLDL 61.8 (H) 07/24/2015 1403   LDLDIRECT 141.0 07/24/2015 1403    Additional studies/ records that were reviewed today include:  Office visit notes    ASSESSMENT:    1. Shortness of breath   2. Essential hypertension      PLAN:  In order of problems listed above:  1.  SOB - this is likely secondary to her COPD.  Her breathing seems to improve with inhalers.  She denies any chest pain and EKG is nonischemic.  Her 2D echo showed normal LVF with diastolic dysfunction and BNP was normal.  She does have CRFs including remote tobacco, HTN and hyperlipidemia.  I will get a Lexiscan myoview to rule out  ischemia.    2.  HTN - her BP is elevated on exam but she states that she has white coat HTN and her daughter states that her BP is normal at home.  SHe will continue on amlodipine 10mg  daily.    Medication Adjustments/Labs and Tests Ordered: Current medicines are reviewed at length with the patient today.  Concerns regarding medicines are outlined above.  Medication changes, Labs and Tests ordered today are listed in the Patient Instructions below.  There are no Patient Instructions on file for this visit.   Signed, Fransico Him, MD  03/16/2017 10:13 AM    [address], [place], [place]  [address] Phone: [phone]; Fax: [phone]

## 2017-03-16 NOTE — Progress Notes (Deleted)
Cardiology Office Note:    Date:  03/16/2017   ID:  [name], DOB 1927-01-23, MRN [id number]  PCP:  Hoyt Koch, MD  Cardiologist:  Fransico Him, MD   Referring MD: Binnie Rail, MD   No chief complaint on file.   History of Present Illness:    [name] is a 83 y.o. female with a hx of ***  Past Medical History:  Diagnosis Date  . ALLERGIC RHINITIS 05/29/2007  . CHRONIC OBSTRUCTIVE PULMONARY DISEASE, ACUTE EXACERBATION 11/21/2007  . COLONIC POLYPS, HX OF 11/21/2007  . COPD 11/21/2007  . DIVERTICULOSIS, COLON 11/21/2007  . HIP PAIN, LEFT, CHRONIC 06/01/2010  . HYPERLIPIDEMIA 05/29/2007  . HYPERTENSION 05/29/2007  . Hypopotassemia 06/12/2007  . HYSTERECTOMY, HX OF 05/29/2007  . Other acquired absence of organ 05/29/2007  . Other postprocedural status(V45.89) 05/29/2007  . RADICULOPATHY 05/29/2007  . REACTIVE AIRWAY DISEASE 05/29/2007    Past Surgical History:  Procedure Laterality Date  . ABDOMINAL HYSTERECTOMY    . APPENDECTOMY    . CATARACT EXTRACTION W/ INTRAOCULAR LENS IMPLANT Bilateral 05/2015   around december 9th and 28   . DILATION AND CURETTAGE OF UTERUS    . LIPOMA EXCISION    . LUMBAR LAMINECTOMY    . TONSILLECTOMY      Current Medications: Current Meds  Medication Sig  . albuterol (PROVENTIL HFA;VENTOLIN HFA) 108 (90 Base) MCG/ACT inhaler Inhale 2 puffs into the lungs every 6 (six) hours as needed for wheezing or shortness of breath.  Marland Kitchen amLODipine (NORVASC) 10 MG tablet Take 1 tablet (10 mg total) by mouth daily.  . fluticasone furoate-vilanterol (BREO ELLIPTA) 100-25 MCG/INH AEPB Inhale 1 puff into the lungs daily.  . Multiple Vitamins-Minerals (CENTRUM SILVER) tablet Take 1 tablet by mouth daily.  . naproxen sodium (ANAPROX) 220 MG tablet Take 220 mg by mouth 2 (two) times daily with a meal.     Allergies:   Prednisone and Codeine   Social History   Social History  . Marital status: Widowed    Spouse name: N/A  .  Number of children: N/A  . Years of education: N/A   Social History Main Topics  . Smoking status: Former Smoker    Quit date: 06/14/1980  . Smokeless tobacco: Never Used  . Alcohol use No  . Drug use: No  . Sexual activity: No   Other Topics Concern  . None   Social History Narrative   HSG. Married Sep 09, 2048- 74yr/divorced. Married 09-09-1960- widowed 09-10-99. 2 daughters-'54 (deceased ovarian cancer), 09-10-63. 1 son 2060/09/09.  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  no heroic measures; i.e. Feeding tubes, mechanical ventilation     Family History: The patient's ***family history includes Arthritis in her mother; Cancer in her daughter; Heart disease in her father and mother; Uterine cancer in her daughter. There is no history of Diabetes, COPD, Stroke, or Kidney disease.  ROS:   Please see the history of present illness.    ROS  All other systems reviewed and negative.   EKGs/Labs/Other Studies Reviewed:    The following studies were reviewed today: ***  EKG:  EKG is *** ordered today.  The ekg ordered today demonstrates ***  Recent Labs: 02/02/2017: ALT 9; BUN 30; Creatinine, Ser 1.33; Hemoglobin 14.7; Platelets 333.0; Potassium 3.7; Pro B Natriuretic peptide (BNP) 36.0; Sodium 143   Recent Lipid Panel    Component Value Date/Time   CHOL 242 (H) 07/24/2015  1403   TRIG 309.0 (H) 07/24/2015 1403   TRIG 434 (HH) 03/23/2006 1539   HDL 56.70 07/24/2015 1403   CHOLHDL 4 07/24/2015 1403   VLDL 61.8 (H) 07/24/2015 1403   LDLDIRECT 141.0 07/24/2015 1403    Physical Exam:    VS:  BP (!) 174/72   Pulse 98   Ht 5\' 4"  (1.626 m)   SpO2 95%     Wt Readings from Last 3 Encounters:  02/02/17 126 lb (57.2 kg)  09/29/16 131 lb (59.4 kg)  09/19/16 131 lb (59.4 kg)     GEN: *** Well nourished, well developed in no acute distress HEENT: Normal NECK: No JVD; No carotid bruits LYMPHATICS: No lymphadenopathy CARDIAC: ***RRR, no murmurs, rubs,  gallops RESPIRATORY:  Clear to auscultation without rales, wheezing or rhonchi  ABDOMEN: Soft, non-tender, non-distended MUSCULOSKELETAL:  No edema; No deformity  SKIN: Warm and dry NEUROLOGIC:  Alert and oriented x 3 PSYCHIATRIC:  Normal affect   ASSESSMENT:    No diagnosis found. PLAN:    In order of problems listed above:  ***   Medication Adjustments/Labs and Tests Ordered: Current medicines are reviewed at length with the patient today.  Concerns regarding medicines are outlined above.  No orders of the defined types were placed in this encounter.  No orders of the defined types were placed in this encounter.   Signed, Fransico Him, MD  03/16/2017 9:56 AM    [place]

## 2017-03-16 NOTE — Patient Instructions (Addendum)
Medication Instructions:  Your physician recommends that you continue on your current medications as directed. Please refer to the Current Medication list given to you today.   Labwork: None ordered  Testing/Procedures: Your physician has requested that you have a lexiscan myoview. For further information please visit [email or web address]. Please follow instruction sheet, as given.  Follow-Up: Your physician wants you to follow-up AS NEEDED if symptoms worsen or fail to improve   Any Other Special Instructions Will Be Listed Below (If Applicable).     If you need a refill on your cardiac medications before your next appointment, please call your pharmacy.

## 2017-03-20 ENCOUNTER — Telehealth (HOSPITAL_COMMUNITY): Payer: Self-pay

## 2017-03-20 ENCOUNTER — Telehealth (HOSPITAL_COMMUNITY): Payer: Self-pay | Admitting: *Deleted

## 2017-03-20 NOTE — Telephone Encounter (Signed)
Patient given detailed instructions per Myocardial Perfusion Study Information Sheet for the test on 03/23/17 at 0945. Patient notified to arrive 15 minutes early and that it is imperative to arrive on time for appointment to keep from having the test rescheduled.  If you need to cancel or reschedule your appointment, please call the office within 24 hours of your appointment. . Patient verbalized understanding.T. Tamesha Ellerbrock, CNMT, RT-N

## 2017-03-20 NOTE — Telephone Encounter (Signed)
Left message on voicemail in reference to upcoming appointment scheduled for  03/23/17. Phone number given for a call back so details instructions can be given.  [name]

## 2017-03-23 ENCOUNTER — Ambulatory Visit (HOSPITAL_COMMUNITY): Payer: Medicare Other | Attending: Cardiovascular Disease

## 2017-03-23 DIAGNOSIS — R9439 Abnormal result of other cardiovascular function study: Secondary | ICD-10-CM | POA: Diagnosis not present

## 2017-03-23 DIAGNOSIS — R0602 Shortness of breath: Secondary | ICD-10-CM

## 2017-03-23 LAB — MYOCARDIAL PERFUSION IMAGING
CHL CUP NUCLEAR SDS: 3
CHL CUP NUCLEAR SRS: 13
CHL CUP NUCLEAR SSS: 16
LHR: 0.26
LV dias vol: 77 mL (ref 46–106)
LV sys vol: 32 mL
NUC STRESS TID: 1.1
Peak HR: 86 {beats}/min
Rest HR: 89 {beats}/min

## 2017-03-23 MED ORDER — TECHNETIUM TC 99M TETROFOSMIN IV KIT
11.0000 | PACK | Freq: Once | INTRAVENOUS | Status: AC | PRN
  Administered 2017-03-23: 11 via INTRAVENOUS
  Filled 2017-03-23: qty 11

## 2017-03-23 MED ORDER — REGADENOSON 0.4 MG/5ML IV SOLN
0.4000 mg | Freq: Once | INTRAVENOUS | Status: AC
  Administered 2017-03-23: 0.4 mg via INTRAVENOUS

## 2017-03-23 MED ORDER — TECHNETIUM TC 99M TETROFOSMIN IV KIT
32.6000 | PACK | Freq: Once | INTRAVENOUS | Status: AC | PRN
  Administered 2017-03-23: 32.6 via INTRAVENOUS
  Filled 2017-03-23: qty 33

## 2017-04-11 DIAGNOSIS — Z23 Encounter for immunization: Secondary | ICD-10-CM | POA: Diagnosis not present

## 2017-04-18 ENCOUNTER — Encounter: Payer: Self-pay | Admitting: Sports Medicine

## 2017-04-18 ENCOUNTER — Ambulatory Visit (INDEPENDENT_AMBULATORY_CARE_PROVIDER_SITE_OTHER): Payer: Medicare Other | Admitting: Sports Medicine

## 2017-04-18 DIAGNOSIS — B351 Tinea unguium: Secondary | ICD-10-CM | POA: Diagnosis not present

## 2017-04-18 DIAGNOSIS — M79672 Pain in left foot: Secondary | ICD-10-CM

## 2017-04-18 DIAGNOSIS — M79671 Pain in right foot: Secondary | ICD-10-CM | POA: Diagnosis not present

## 2017-04-18 DIAGNOSIS — I739 Peripheral vascular disease, unspecified: Secondary | ICD-10-CM

## 2017-04-18 NOTE — Progress Notes (Signed)
  Patient ID: [name], female   DOB: 06-27-1926, 83 y.o.   MRN: [id number]  Subjective: [name] is a 83 y.o. female patient seen today in office with complaint of painful thickened and elongated toenails; unable to trim. Patient denies any changes with medical history since last visit. Patient has no other pedal complaints at this time.   Patient Active Problem List   Diagnosis Date Noted  . Dyspnea 02/02/2017  . COPD exacerbation ([place]) 02/02/2017  . Carpal tunnel syndrome of right wrist 07/26/2015  . Advanced care planning/counseling discussion 07/17/2013  . Chronic low back pain 06/18/2012  . Routine health maintenance 06/18/2011  . COPD, mild ([place]) 11/21/2007  . Hyperlipidemia 05/29/2007  . Essential hypertension 05/29/2007  . Allergic rhinitis 05/29/2007   Current Outpatient Medications on File Prior to Visit  Medication Sig Dispense Refill  . albuterol (PROVENTIL HFA;VENTOLIN HFA) 108 (90 Base) MCG/ACT inhaler Inhale 2 puffs into the lungs every 6 (six) hours as needed for wheezing or shortness of breath. 1 Inhaler 5  . amLODipine (NORVASC) 10 MG tablet Take 1 tablet (10 mg total) by mouth daily. 90 tablet 3  . fluticasone furoate-vilanterol (BREO ELLIPTA) 100-25 MCG/INH AEPB Inhale 1 puff into the lungs daily.    . Multiple Vitamins-Minerals (CENTRUM SILVER) tablet Take 1 tablet by mouth daily.    . naproxen sodium (ANAPROX) 220 MG tablet Take 220 mg by mouth 2 (two) times daily with a meal.     No current facility-administered medications on file prior to visit.    Allergies  Allergen Reactions  . Prednisone     Patient became jittery all over  . Codeine     Objective: Physical Exam  General: Well developed, nourished, no acute distress, awake, alert and oriented x 3  Vascular: Dorsalis pedis artery 1/4 bilateral, Posterior tibial artery 0/4 bilateral, skin temperature warm to warm proximal to distal bilateral lower extremities, mild varicosities,  pedal hair present bilateral.  Neurological: Gross sensation present via light touch bilateral.   Dermatological: Skin is warm, dry, and supple bilateral, Nails 1-10 are tender, long, thick, and discolored with mild subungal debris, no webspace macerations present bilateral, no open lesions present bilateral, no callus/corns/hyperkeratotic tissue present bilateral. No signs of infection bilateral.  Musculoskeletal: Asymptomatic hammertoe deformities noted bilateral. Muscular strength within normal limits without pain on range of motion. No pain with calf compression bilateral.  Assessment and Plan:  Problem List Items Addressed This Visit    None    Visit Diagnoses    Dermatophytosis of nail    -  Primary   Foot pain, bilateral       PVD (peripheral vascular disease) ([place])         -Examined patient.  -Discussed treatment options for painful mycotic nails. -Mechanically debrided and reduced mycotic nails with sterile nail nipper and dremel nail file without incident. -Patient to return in 3 months for follow up evaluation or sooner if symptoms worsen.  Landis Martins, DPM

## 2017-05-11 ENCOUNTER — Telehealth: Payer: Self-pay | Admitting: Internal Medicine

## 2017-05-11 ENCOUNTER — Other Ambulatory Visit: Payer: Self-pay

## 2017-05-11 MED ORDER — FLUTICASONE FUROATE-VILANTEROL 100-25 MCG/INH IN AEPB: 1.0000 | INHALATION_SPRAY | Freq: Every day | RESPIRATORY_TRACT | Status: DC

## 2017-05-11 NOTE — Telephone Encounter (Signed)
Copied from [place]. Topic: Quick Communication - See Telephone Encounter >> May 11, 2017  9:10 AM Cleaster Corin, NT wrote: CRM for notification. See Telephone encounter for:   05/11/17. Pt. Needs a rx. For (Breo-Ellipta) for her COPD pt. Said she is completely out. best number she can be reached at is [phone] she tried to call pharmacy but they said she didn't have anymore refills   [facility] [place] [address] Phone: [phone] Fax: [phone] Not a 24 hour pharmacy; exact hours not known

## 2017-05-12 ENCOUNTER — Other Ambulatory Visit: Payer: Self-pay

## 2017-05-12 MED ORDER — FLUTICASONE FUROATE-VILANTEROL 100-25 MCG/INH IN AEPB: 1.0000 | INHALATION_SPRAY | Freq: Every day | RESPIRATORY_TRACT | Status: DC

## 2017-05-12 MED ORDER — FLUTICASONE FUROATE-VILANTEROL 100-25 MCG/INH IN AEPB: 1.0000 | INHALATION_SPRAY | Freq: Every day | RESPIRATORY_TRACT | 1 refills | Status: DC

## 2017-05-12 NOTE — Telephone Encounter (Signed)
Please advise, can patient receive a refill,  Rx from yesterday says no print  Please advise  [facility] will make appt for patient

## 2017-05-12 NOTE — Telephone Encounter (Signed)
Refill was sent to pharmacy.

## 2017-07-25 ENCOUNTER — Encounter: Payer: Self-pay | Admitting: Sports Medicine

## 2017-07-25 ENCOUNTER — Ambulatory Visit (INDEPENDENT_AMBULATORY_CARE_PROVIDER_SITE_OTHER): Payer: Medicare Other | Admitting: Sports Medicine

## 2017-07-25 DIAGNOSIS — B351 Tinea unguium: Secondary | ICD-10-CM

## 2017-07-25 DIAGNOSIS — I739 Peripheral vascular disease, unspecified: Secondary | ICD-10-CM

## 2017-07-25 DIAGNOSIS — M79672 Pain in left foot: Secondary | ICD-10-CM | POA: Diagnosis not present

## 2017-07-25 DIAGNOSIS — M79671 Pain in right foot: Secondary | ICD-10-CM | POA: Diagnosis not present

## 2017-07-25 NOTE — Progress Notes (Signed)
  Patient ID: [name], female   DOB: 02-Jan-1927, 84 y.o.   MRN: [id number]  Subjective: [name] is a 84 y.o. female patient seen today in office with complaint of painful thickened and elongated toenails; unable to trim. Patient denies any changes with medical history since last visit. Patient has no other pedal complaints at this time.   Patient Active Problem List   Diagnosis Date Noted  . Dyspnea 02/02/2017  . COPD exacerbation ([place]) 02/02/2017  . Carpal tunnel syndrome of right wrist 07/26/2015  . Advanced care planning/counseling discussion 07/17/2013  . Chronic low back pain 06/18/2012  . Routine health maintenance 06/18/2011  . COPD, mild ([place]) 11/21/2007  . Hyperlipidemia 05/29/2007  . Essential hypertension 05/29/2007  . Allergic rhinitis 05/29/2007   Current Outpatient Medications on File Prior to Visit  Medication Sig Dispense Refill  . albuterol (PROVENTIL HFA;VENTOLIN HFA) 108 (90 Base) MCG/ACT inhaler Inhale 2 puffs into the lungs every 6 (six) hours as needed for wheezing or shortness of breath. 1 Inhaler 5  . amLODipine (NORVASC) 10 MG tablet Take 1 tablet (10 mg total) by mouth daily. 90 tablet 3  . fluticasone furoate-vilanterol (BREO ELLIPTA) 100-25 MCG/INH AEPB Inhale 1 puff into the lungs daily. 30 each 1  . Multiple Vitamins-Minerals (CENTRUM SILVER) tablet Take 1 tablet by mouth daily.    . naproxen sodium (ANAPROX) 220 MG tablet Take 220 mg by mouth 2 (two) times daily with a meal.     No current facility-administered medications on file prior to visit.    Allergies  Allergen Reactions  . Prednisone     Patient became jittery all over  . Codeine     Objective: Physical Exam  General: Well developed, nourished, no acute distress, awake, alert and oriented x 3  Vascular: Dorsalis pedis artery 1/4 bilateral, Posterior tibial artery 0/4 bilateral, skin temperature warm to warm proximal to distal bilateral lower extremities, mild  varicosities, pedal hair present bilateral.  Neurological: Gross sensation present via light touch bilateral.   Dermatological: Skin is warm, dry, and supple bilateral, Nails 1-10 are tender, long, thick, and discolored with mild subungal debris, no webspace macerations present bilateral, no open lesions present bilateral, no callus/corns/hyperkeratotic tissue present bilateral. No signs of infection bilateral.  Musculoskeletal: Asymptomatic hammertoe deformities noted bilateral. Muscular strength within normal limits without pain on range of motion. No pain with calf compression bilateral.  Assessment and Plan:  Problem List Items Addressed This Visit    None    Visit Diagnoses    Dermatophytosis of nail    -  Primary   Foot pain, bilateral       PVD (peripheral vascular disease) ([place])         -Examined patient.  -Discussed treatment options for painful mycotic nails. -Mechanically debrided and reduced mycotic nails with sterile nail nipper and dremel nail file without incident. -ABN signed -Patient to return in 3 months for follow up evaluation or sooner if symptoms worsen.  Landis Martins, DPM

## 2017-08-17 ENCOUNTER — Ambulatory Visit (INDEPENDENT_AMBULATORY_CARE_PROVIDER_SITE_OTHER): Payer: Medicare Other | Admitting: Internal Medicine

## 2017-08-17 ENCOUNTER — Ambulatory Visit (INDEPENDENT_AMBULATORY_CARE_PROVIDER_SITE_OTHER)
Admission: RE | Admit: 2017-08-17 | Discharge: 2017-08-17 | Disposition: A | Discharge status: Home / Self Care | Payer: Medicare Other | Source: Ambulatory Visit | Attending: Internal Medicine | Admitting: Internal Medicine

## 2017-08-17 ENCOUNTER — Encounter: Payer: Self-pay | Admitting: Internal Medicine

## 2017-08-17 VITALS — BP 150/90 | HR 104 | Temp 97.9°F | Ht 64.0 in | Wt 136.0 lb

## 2017-08-17 DIAGNOSIS — G8929 Other chronic pain: Secondary | ICD-10-CM | POA: Diagnosis not present

## 2017-08-17 DIAGNOSIS — J449 Chronic obstructive pulmonary disease, unspecified: Secondary | ICD-10-CM

## 2017-08-17 DIAGNOSIS — M545 Low back pain, unspecified: Secondary | ICD-10-CM

## 2017-08-17 DIAGNOSIS — R21 Rash and other nonspecific skin eruption: Secondary | ICD-10-CM

## 2017-08-17 MED ORDER — LIDOCAINE 5 % EX PTCH: 1.0000 | MEDICATED_PATCH | CUTANEOUS | 0 refills | Status: DC

## 2017-08-17 MED ORDER — NYSTATIN-TRIAMCINOLONE 100000-0.1 UNIT/GM-% EX OINT
1.0000 | TOPICAL_OINTMENT | Freq: Two times a day (BID) | CUTANEOUS | 0 refills | Status: DC
Start: 2017-08-17 — End: 2019-10-28

## 2017-08-17 MED ORDER — FLUTICASONE FUROATE-VILANTEROL 100-25 MCG/INH IN AEPB: 1.0000 | INHALATION_SPRAY | Freq: Every day | RESPIRATORY_TRACT | 1 refills | Status: DC

## 2017-08-17 MED ORDER — AMLODIPINE BESYLATE 10 MG PO TABS: 10.0000 mg | ORAL_TABLET | Freq: Every day | ORAL | 3 refills | Status: DC

## 2017-08-17 MED ORDER — ALBUTEROL SULFATE HFA 108 (90 BASE) MCG/ACT IN AERS: 2.0000 | INHALATION_SPRAY | Freq: Four times a day (QID) | RESPIRATORY_TRACT | 0 refills | Status: DC | PRN

## 2017-08-17 NOTE — Progress Notes (Signed)
   Subjective:    Patient ID: [name], female    DOB: 1926-07-31, 84 y.o.   MRN: [id number]  HPI The patient is an 84 YO female coming in for several concerns including rash (under her breasts and down by her groin, red and some itching, she does have some "diaper rash" per them although she does not wear diapers, she denies pain in the area, denies vaginal drainage or discharge), and low back pain (going on for many years but just getting worse gradually over the last 6 months, she is having a hard time standing for any length of time, she does not have pain sitting, has tried tylenol for it but is not sure if it works, does go into her legs, has not fallen because of it), and her breathing (taking breo and does have SOB with any exertion, has had full cardiac workup since our last visit and they did not find any cause, they suspected some deconditioning, she does not walk more than 10-15 feet at one time at home routinely and no exercise).   Review of Systems  Constitutional: Positive for activity change and fatigue. Negative for appetite change, chills, fever and unexpected weight change.  HENT: Negative.   Eyes: Negative.   Respiratory: Positive for shortness of breath. Negative for cough, chest tightness and wheezing.   Cardiovascular: Negative.   Gastrointestinal: Negative.   Musculoskeletal: Positive for arthralgias, back pain and myalgias. Negative for gait problem and joint swelling.  Skin: Positive for rash.  Neurological: Negative.   Psychiatric/Behavioral: Negative.       Objective:   Physical Exam  Constitutional: She is oriented to person, place, and time. She appears well-developed and well-nourished.  HENT:  Head: Normocephalic and atraumatic.  Eyes: EOM are normal.  Neck: Normal range of motion.  Cardiovascular: Normal rate and regular rhythm.  Pulmonary/Chest: Effort normal and breath sounds normal. No respiratory distress. She has no wheezes. She has no rales.    No wheezing but mild SOB during the visit  Abdominal: Soft. Bowel sounds are normal. She exhibits no distension. There is no tenderness. There is no rebound.  Musculoskeletal: She exhibits tenderness. She exhibits no edema.  Pain in the lumbar region and thoracic midline and paraspinal  Neurological: She is alert and oriented to person, place, and time. Coordination normal.  Skin: Skin is warm and dry.  Some skin candida in the groin creases  Psychiatric: She has a normal mood and affect.   Vitals:   08/17/17 1354 08/17/17 1437  BP: (!) 160/100 (!) 150/90  Pulse: (!) 104   Temp: 97.9 F (36.6 C)   TempSrc: Oral   SpO2: 95%   Weight: 136 lb (61.7 kg)   Height: 5\' 4"  (1.626 m)       Assessment & Plan:

## 2017-08-17 NOTE — Patient Instructions (Addendum)
We have sent in albuterol to use as needed for breathing problems. Keep taking the breo everyday. Some of the breathing problems come from not doing much so work on walking around the house more to build up your endurance.   We will get an x-ray of the low back. We have sent in lidoderm patches to use for pain. Put this on the low back and can help for 24 hours until you need to switch the patch. It is okay to use tylenol or aleve as well for the pain.  We have sent in the cream to use as needed.

## 2017-08-19 DIAGNOSIS — R21 Rash and other nonspecific skin eruption: Secondary | ICD-10-CM | POA: Insufficient documentation

## 2017-08-19 NOTE — Assessment & Plan Note (Signed)
Rx for mycolog cream for the rash and advised to avoid moisture in the area to decrease risk of recurrence.

## 2017-08-19 NOTE — Assessment & Plan Note (Signed)
Check lumbar x-ray to check for compression fracture. Rx for lidoderm patch.

## 2017-08-19 NOTE — Assessment & Plan Note (Signed)
Suspect some from deconditioning. Continue breo as it is helping. Rx for albuterol for as needed. Encouraged walking and she declines home PT at this time. Wants to try increasing endurance on her own.

## 2017-08-21 ENCOUNTER — Other Ambulatory Visit: Payer: Self-pay | Admitting: Internal Medicine

## 2017-08-21 DIAGNOSIS — M5416 Radiculopathy, lumbar region: Secondary | ICD-10-CM

## 2017-09-19 DIAGNOSIS — M4150 Other secondary scoliosis, site unspecified: Secondary | ICD-10-CM | POA: Diagnosis not present

## 2017-09-19 DIAGNOSIS — I10 Essential (primary) hypertension: Secondary | ICD-10-CM | POA: Diagnosis not present

## 2017-09-19 DIAGNOSIS — M545 Low back pain: Secondary | ICD-10-CM | POA: Diagnosis not present

## 2017-09-19 DIAGNOSIS — M4316 Spondylolisthesis, lumbar region: Secondary | ICD-10-CM | POA: Diagnosis not present

## 2017-09-21 DIAGNOSIS — M48061 Spinal stenosis, lumbar region without neurogenic claudication: Secondary | ICD-10-CM | POA: Diagnosis not present

## 2017-09-21 DIAGNOSIS — M5126 Other intervertebral disc displacement, lumbar region: Secondary | ICD-10-CM | POA: Diagnosis not present

## 2017-09-21 DIAGNOSIS — M4316 Spondylolisthesis, lumbar region: Secondary | ICD-10-CM | POA: Diagnosis not present

## 2017-10-05 DIAGNOSIS — M4316 Spondylolisthesis, lumbar region: Secondary | ICD-10-CM | POA: Diagnosis not present

## 2017-10-05 DIAGNOSIS — M545 Low back pain: Secondary | ICD-10-CM | POA: Diagnosis not present

## 2017-10-05 DIAGNOSIS — I10 Essential (primary) hypertension: Secondary | ICD-10-CM | POA: Diagnosis not present

## 2017-10-24 ENCOUNTER — Ambulatory Visit: Payer: Medicare Other | Admitting: Sports Medicine

## 2017-11-09 ENCOUNTER — Telehealth: Payer: Self-pay | Admitting: Emergency Medicine

## 2017-11-09 NOTE — Telephone Encounter (Signed)
Called patient to schedule AWV. Patient declined at this time. 

## 2017-11-13 DIAGNOSIS — M545 Low back pain: Secondary | ICD-10-CM | POA: Diagnosis not present

## 2017-11-13 DIAGNOSIS — M4316 Spondylolisthesis, lumbar region: Secondary | ICD-10-CM | POA: Diagnosis not present

## 2017-11-14 ENCOUNTER — Encounter: Payer: Self-pay | Admitting: Sports Medicine

## 2017-11-14 ENCOUNTER — Ambulatory Visit (INDEPENDENT_AMBULATORY_CARE_PROVIDER_SITE_OTHER): Payer: Medicare Other | Admitting: Sports Medicine

## 2017-11-14 DIAGNOSIS — I739 Peripheral vascular disease, unspecified: Secondary | ICD-10-CM

## 2017-11-14 DIAGNOSIS — M79672 Pain in left foot: Secondary | ICD-10-CM

## 2017-11-14 DIAGNOSIS — B351 Tinea unguium: Secondary | ICD-10-CM | POA: Diagnosis not present

## 2017-11-14 DIAGNOSIS — M79671 Pain in right foot: Secondary | ICD-10-CM | POA: Diagnosis not present

## 2017-11-14 NOTE — Progress Notes (Signed)
  Patient ID: QUINCEY NORED, female   DOB: 1926/12/08, 84 y.o.   MRN: [id number]  Subjective: [name] is a 84 y.o. female patient seen today in office with complaint of painful thickened and elongated toenails; unable to trim. Patient denies any changes with medical history since last visit. Patient has no other pedal complaints at this time.   Patient Active Problem List   Diagnosis Date Noted  . Rash 08/19/2017  . Dyspnea 02/02/2017  . Carpal tunnel syndrome of right wrist 07/26/2015  . Advanced care planning/counseling discussion 07/17/2013  . Chronic low back pain 06/18/2012  . Routine health maintenance 06/18/2011  . COPD, mild ([place]) 11/21/2007  . Hyperlipidemia 05/29/2007  . Essential hypertension 05/29/2007  . Allergic rhinitis 05/29/2007   Current Outpatient Medications on File Prior to Visit  Medication Sig Dispense Refill  . albuterol (PROVENTIL HFA;VENTOLIN HFA) 108 (90 Base) MCG/ACT inhaler Inhale 2 puffs into the lungs every 6 (six) hours as needed for wheezing or shortness of breath. 1 Inhaler 0  . amLODipine (NORVASC) 10 MG tablet Take 1 tablet (10 mg total) by mouth daily. 90 tablet 3  . fluticasone furoate-vilanterol (BREO ELLIPTA) 100-25 MCG/INH AEPB Inhale 1 puff into the lungs daily. 30 each 1  . lidocaine (LIDODERM) 5 % Place 1 patch onto the skin daily. Remove & Discard patch within 12 hours or as directed by MD 30 patch 0  . Multiple Vitamins-Minerals (CENTRUM SILVER) tablet Take 1 tablet by mouth daily.    . naproxen sodium (ANAPROX) 220 MG tablet Take 220 mg by mouth 2 (two) times daily with a meal.    . nystatin-triamcinolone ointment (MYCOLOG) Apply 1 application topically 2 (two) times daily. 30 g 0   No current facility-administered medications on file prior to visit.    Allergies  Allergen Reactions  . Prednisone     Patient became jittery all over  . Codeine     Objective: Physical Exam  General: Well developed, nourished, no acute  distress, awake, alert and oriented x 3  Vascular: Dorsalis pedis artery 1/4 bilateral, Posterior tibial artery 0/4 bilateral, skin temperature warm to warm proximal to distal bilateral lower extremities, mild varicosities, pedal hair present bilateral.  Neurological: Gross sensation present via light touch bilateral.   Dermatological: Skin is warm, dry, and supple bilateral, Nails 1-10 are tender, long, thick, and discolored with mild subungal debris, no webspace macerations present bilateral, no open lesions present bilateral, no callus/corns/hyperkeratotic tissue present bilateral. No signs of infection bilateral.  Musculoskeletal: Asymptomatic hammertoe deformities noted bilateral. Muscular strength within normal limits without pain on range of motion. No pain with calf compression bilateral.  Assessment and Plan:  Problem List Items Addressed This Visit    None    Visit Diagnoses    Dermatophytosis of nail    -  Primary   Foot pain, bilateral       PVD (peripheral vascular disease) ([place])         -Examined patient.  -Discussed treatment options for painful mycotic nails. -Mechanically debrided and reduced mycotic nails with sterile nail nipper and dremel nail file without incident. -Patient to return in 3 months for follow up evaluation or sooner if symptoms worsen.  Landis Martins, DPM

## 2017-12-29 ENCOUNTER — Other Ambulatory Visit: Payer: Self-pay | Admitting: Internal Medicine

## 2018-01-16 ENCOUNTER — Ambulatory Visit (INDEPENDENT_AMBULATORY_CARE_PROVIDER_SITE_OTHER): Payer: Medicare Other | Admitting: Sports Medicine

## 2018-01-16 ENCOUNTER — Encounter: Payer: Self-pay | Admitting: Sports Medicine

## 2018-01-16 DIAGNOSIS — I739 Peripheral vascular disease, unspecified: Secondary | ICD-10-CM

## 2018-01-16 DIAGNOSIS — M79671 Pain in right foot: Secondary | ICD-10-CM | POA: Diagnosis not present

## 2018-01-16 DIAGNOSIS — B351 Tinea unguium: Secondary | ICD-10-CM

## 2018-01-16 DIAGNOSIS — M79672 Pain in left foot: Secondary | ICD-10-CM

## 2018-01-16 NOTE — Progress Notes (Signed)
  Patient ID: ELLARY CASAMENTO, female   DOB: May 14, 1927, 84 y.o.   MRN: [id number]  Subjective: [name] is a 84 y.o. female patient seen today in office with complaint of painful thickened and elongated toenails; unable to trim. Patient denies any changes with medical history since last visit. Patient has no other pedal complaints at this time.   Patient Active Problem List   Diagnosis Date Noted  . Rash 08/19/2017  . Dyspnea 02/02/2017  . Carpal tunnel syndrome of right wrist 07/26/2015  . Advanced care planning/counseling discussion 07/17/2013  . Chronic low back pain 06/18/2012  . Routine health maintenance 06/18/2011  . COPD, mild ([place]) 11/21/2007  . Hyperlipidemia 05/29/2007  . Essential hypertension 05/29/2007  . Allergic rhinitis 05/29/2007   Current Outpatient Medications on File Prior to Visit  Medication Sig Dispense Refill  . amLODipine (NORVASC) 10 MG tablet Take 1 tablet (10 mg total) by mouth daily. 90 tablet 3  . BREO ELLIPTA 100-25 MCG/INH AEPB INHALE 1 PUFF INTO THE LUNGS DAILY 60 each 0  . lidocaine (LIDODERM) 5 % Place 1 patch onto the skin daily. Remove & Discard patch within 12 hours or as directed by MD 30 patch 0  . Multiple Vitamins-Minerals (CENTRUM SILVER) tablet Take 1 tablet by mouth daily.    . naproxen sodium (ANAPROX) 220 MG tablet Take 220 mg by mouth 2 (two) times daily with a meal.    . nystatin-triamcinolone ointment (MYCOLOG) Apply 1 application topically 2 (two) times daily. 30 g 0  . VENTOLIN HFA 108 (90 Base) MCG/ACT inhaler INHALE 2 PUFFS INTO THE LUNGS EVERY 6 HOURS AS NEEDED FOR WHEEZING ORSHORTNESS OF BREATH 18 g 1   No current facility-administered medications on file prior to visit.    Allergies  Allergen Reactions  . Prednisone     Patient became jittery all over  . Codeine     Objective: Physical Exam  General: Well developed, nourished, no acute distress, awake, alert and oriented x 3  Vascular: Dorsalis pedis artery  1/4 bilateral, Posterior tibial artery 0/4 bilateral, skin temperature warm to warm proximal to distal bilateral lower extremities, mild varicosities, pedal hair present bilateral.  Neurological: Gross sensation present via light touch bilateral.   Dermatological: Skin is warm, dry, and supple bilateral, Nails 1-10 are tender, long, thick, and discolored with mild subungal debris, no webspace macerations present bilateral, no open lesions present bilateral, no callus/corns/hyperkeratotic tissue present bilateral. No signs of infection bilateral.  Musculoskeletal: Asymptomatic hammertoe deformities noted bilateral. Muscular strength within normal limits without pain on range of motion. No pain with calf compression bilateral.  Assessment and Plan:  Problem List Items Addressed This Visit    None    Visit Diagnoses    Dermatophytosis of nail    -  Primary   Foot pain, bilateral       PVD (peripheral vascular disease) ([place])         -Examined patient.  -Discussed treatment options for painful mycotic nails. -Mechanically debrided and reduced mycotic nails with sterile nail nipper and dremel nail file without incident. -Patient to return in 3 months for follow up evaluation or sooner if symptoms worsen.  Landis Martins, DPM

## 2018-03-14 DIAGNOSIS — H0100B Unspecified blepharitis left eye, upper and lower eyelids: Secondary | ICD-10-CM | POA: Diagnosis not present

## 2018-03-14 DIAGNOSIS — Z961 Presence of intraocular lens: Secondary | ICD-10-CM | POA: Diagnosis not present

## 2018-03-14 DIAGNOSIS — H353111 Nonexudative age-related macular degeneration, right eye, early dry stage: Secondary | ICD-10-CM | POA: Diagnosis not present

## 2018-03-14 DIAGNOSIS — H18413 Arcus senilis, bilateral: Secondary | ICD-10-CM | POA: Diagnosis not present

## 2018-03-14 DIAGNOSIS — H04123 Dry eye syndrome of bilateral lacrimal glands: Secondary | ICD-10-CM | POA: Diagnosis not present

## 2018-03-14 DIAGNOSIS — H11153 Pinguecula, bilateral: Secondary | ICD-10-CM | POA: Diagnosis not present

## 2018-03-14 DIAGNOSIS — Z9849 Cataract extraction status, unspecified eye: Secondary | ICD-10-CM | POA: Diagnosis not present

## 2018-03-14 DIAGNOSIS — H1850 Unspecified hereditary corneal dystrophies: Secondary | ICD-10-CM | POA: Diagnosis not present

## 2018-03-14 DIAGNOSIS — H0100A Unspecified blepharitis right eye, upper and lower eyelids: Secondary | ICD-10-CM | POA: Diagnosis not present

## 2018-03-14 DIAGNOSIS — H3530 Unspecified macular degeneration: Secondary | ICD-10-CM | POA: Diagnosis not present

## 2018-03-28 ENCOUNTER — Other Ambulatory Visit: Payer: Self-pay | Admitting: Internal Medicine

## 2018-03-30 NOTE — Progress Notes (Signed)
[facility] Note  04/02/2018     CHIEF COMPLAINT Patient presents for Retina Evaluation   HISTORY OF PRESENT ILLNESS: [name] is a 84 y.o. female who presents to the clinic today for:   HPI    Retina Evaluation    In right eye.  Associated Symptoms Negative for Flashes, Blind Spot, Photophobia, Scalp Tenderness, Fever, Floaters, Pain, Glare, Jaw Claudication, Weight Loss, Distortion, Redness, Trauma, Shoulder/Hip pain and Fatigue.  Context:  near vision and reading.  I, the attending physician,  performed the HPI with the patient and updated documentation appropriately.          Comments    Patient states referred by Dr. Shirley Muscat for retinal evaluation for possible wet ARMD OD. Patient states no waviness or distortion in vision. Denies floaters and FOL. More dependent on reading glasses for near but patient can see ok at near with glasses on. Onset and duration of eye disease unknown.       Last edited by Bernarda Caffey, MD on 04/02/2018  1:36 PM. (History)      Referring physician: Calton Dach, MD [place], [place] [address]  HISTORICAL INFORMATION:   Selected notes from the MEDICAL RECORD NUMBER Referred by Dr. Thurston Hole for concern of exu ARMD LEE: 10.12.19 (S. Bernstorf) [BCVA: OD: 20/30+ OS: 20/30] Ocular Hx-pseudo OU, non-exu ARMD OD, blepharitis OU, DES OU, arcus, corneal dystophy, pinguecula PMH-rheumatoid arthritis, asthma, COPD, HTN    CURRENT MEDICATIONS: Current Outpatient Medications (Ophthalmic Drugs)  Medication Sig  . RESTASIS 0.05 % ophthalmic emulsion Apply to eye 2 (two) times daily.   No current facility-administered medications for this visit.  (Ophthalmic Drugs)   Current Outpatient Medications (Other)  Medication Sig  . amLODipine (NORVASC) 10 MG tablet Take 1 tablet (10 mg total) by mouth daily.  Marland Kitchen BREO ELLIPTA 100-25 MCG/INH AEPB INHALE 1 PUFF INTO THE LUNGS DAILY  .  lidocaine (LIDODERM) 5 % Place 1 patch onto the skin daily. Remove & Discard patch within 12 hours or as directed by MD  . Multiple Vitamins-Minerals (CENTRUM SILVER) tablet Take 1 tablet by mouth daily.  . naproxen sodium (ANAPROX) 220 MG tablet Take 220 mg by mouth 2 (two) times daily with a meal.  . VENTOLIN HFA 108 (90 Base) MCG/ACT inhaler INHALE 2 PUFFS INTO THE LUNGS EVERY 6 HOURS AS NEEDED FOR WHEEZING ORSHORTNESS OF BREATH  . nystatin-triamcinolone ointment (MYCOLOG) Apply 1 application topically 2 (two) times daily. (Patient not taking: Reported on 04/02/2018)   No current facility-administered medications for this visit.  (Other)      REVIEW OF SYSTEMS: ROS    Positive for: Eyes, Respiratory   Negative for: Constitutional, Gastrointestinal, Neurological, Skin, Genitourinary, Musculoskeletal, HENT, Endocrine, Cardiovascular, Psychiatric, Allergic/Imm, Heme/Lymph   Last edited by Roselee Nova D on 04/02/2018  1:21 PM. (History)    pt states she is a pt of Dr. Shirley Muscat and she was sent over here because he was concerned with her ARMD, pt states she had cat sx in 2016 performed by Dr. Midge Aver, pt denies taking any eye vits, but she takes a centrum silver every day   ALLERGIES Allergies  Allergen Reactions  . Prednisone     Patient became jittery all over  . Codeine     PAST MEDICAL HISTORY Past Medical History:  Diagnosis Date  . ALLERGIC RHINITIS 05/29/2007  . CHRONIC OBSTRUCTIVE PULMONARY DISEASE, ACUTE EXACERBATION 11/21/2007  . COLONIC POLYPS, HX OF 11/21/2007  .  COPD 11/21/2007  . DIVERTICULOSIS, COLON 11/21/2007  . HIP PAIN, LEFT, CHRONIC 06/01/2010  . HYPERLIPIDEMIA 05/29/2007  . HYPERTENSION 05/29/2007  . Hypopotassemia 06/12/2007  . HYSTERECTOMY, HX OF 05/29/2007  . Other acquired absence of organ 05/29/2007  . Other postprocedural status(V45.89) 05/29/2007  . RADICULOPATHY 05/29/2007  . REACTIVE AIRWAY DISEASE 05/29/2007   Past Surgical History:   Procedure Laterality Date  . ABDOMINAL HYSTERECTOMY    . APPENDECTOMY    . CATARACT EXTRACTION Bilateral 2016   Dr. Katy Fitch  . CATARACT EXTRACTION W/ INTRAOCULAR LENS IMPLANT Bilateral 05/2015   around december 9th and 28   . DILATION AND CURETTAGE OF UTERUS    . LIPOMA EXCISION    . LUMBAR LAMINECTOMY    . TONSILLECTOMY      FAMILY HISTORY Family History  Problem Relation Age of Onset  . Arthritis Mother   . Heart disease Mother   . Uterine cancer Daughter   . Cancer Daughter        ovarian  . Heart disease Father   . Diabetes Neg Hx   . COPD Neg Hx   . Stroke Neg Hx   . Kidney disease Neg Hx     SOCIAL HISTORY Social History   Tobacco Use  . Smoking status: Former Smoker    Last attempt to quit: 06/14/1980    Years since quitting: 37.8  . Smokeless tobacco: Never Used  Substance Use Topics  . Alcohol use: No    Alcohol/week: 0.0 standard drinks  . Drug use: No         OPHTHALMIC EXAM:  Base Eye Exam    Visual Acuity (Snellen - Linear)      Right Left   Dist [place] 20/25 -2 20/20       Tonometry (Tonopen, 1:26 PM)      Right Left   Pressure 17 17       Pupils      Dark Light Shape React APD   Right 4 3 Round Slow None   Left 3 2 Round Slow None       Visual Fields (Counting fingers)      Left Right    Full Full       Extraocular Movement      Right Left    Full, Ortho Full, Ortho       Neuro/Psych    Oriented x3:  Yes   Mood/Affect:  Normal       Dilation    Both eyes:  1.0% Mydriacyl, 2.5% Phenylephrine @ 1:26 PM        Slit Lamp and Fundus Exam    Slit Lamp Exam      Right Left   Lids/Lashes severe Dermatochalasis - upper lid Dermatochalasis - upper lid, Telangiectasia   Conjunctiva/Sclera White and quiet White and quiet   Cornea 1+ Punctate epithelial erosions, mild Arcus 1+ Punctate epithelial erosions, Arcus, trace endopigment   Anterior Chamber Deep and quiet Deep and quiet   Iris Round and moderately dilated to 5.5 mm Round  and moderately dilated to 52mm   Lens Posterior chamber intraocular lens in good position Posterior chamber intraocular lens in excellent position   Vitreous Vitreous syneresis Vitreous syneresis       Fundus Exam      Right Left   Disc Pink and Sharp Pink and Sharp   C/D Ratio 0.4 0.45   Macula Blunted foveal reflex, Drusen, RPE mottling and clumping, No heme or edema Flat, Blunted foveal reflex,  Drusen, RPE mottling, No heme or edema   Vessels Vascular attenuation, Tortuous, +AV crossing changes Vascular attenuation, Tortuous, +AV crossing changes   Periphery Attached, scattered RPE changes Attached; mild reticular degen        Refraction    Manifest Refraction (Retinoscopy)      Sphere Cylinder Axis Dist VA   Right -0.25 +0.75 012 20/20-1   Left -0.75 +0.50 005 20/20-1          IMAGING AND PROCEDURES  Imaging and Procedures for @TODAY @  OCT, Retina - OU - Both Eyes       Right Eye Quality was good. Central Foveal Thickness: 293. Progression has no prior data. Findings include normal foveal contour, no IRF, no SRF, outer retinal atrophy, retinal drusen , pigment epithelial detachment (Irregular scleral contour).   Left Eye Quality was good. Central Foveal Thickness: 282. Progression has no prior data. Findings include normal foveal contour, no SRF, no IRF, retinal drusen  (Irregular scleral contour).   Notes *Images captured and stored on drive  Diagnosis / Impression:  Non-exu ARMD OU   Clinical management:  See below  Abbreviations: NFP - Normal foveal profile. CME - cystoid macular edema. PED - pigment epithelial detachment. IRF - intraretinal fluid. SRF - subretinal fluid. EZ - ellipsoid zone. ERM - epiretinal membrane. ORA - outer retinal atrophy. ORT - outer retinal tubulation. SRHM - subretinal hyper-reflective material                 ASSESSMENT/PLAN:    ICD-10-CM   1. Intermediate stage nonexudative age-related macular degeneration of both eyes  H35.3132   2. Retinal edema H35.81 OCT, Retina - OU - Both Eyes  3. Essential hypertension I10   4. Hypertensive retinopathy of both eyes H35.033   5. Pseudophakia of both eyes Z96.1   6. Dry eyes H04.123     1. Age related macular degeneration, non-exudative, both eyes  - The incidence, anatomy, and pathology of dry AMD, risk of progression, and the AREDS and AREDS 2 study including smoking risks discussed with patient.  - Recommend amsler grid monitoring -- amsler grid given  - f/u 6-9 months  2. No retinal edema on exam or OCT  3,4. Hypertensive retinopathy OU - discussed importance of tight BP control - monitor  5. Pseudophakia OU  - s/p CE/IOL OU 05/2015, Dr. Shirleen Schirmer  - beautiful surgeries, doing well  - monitor  6. Dry eyes OU - recommend artificial tears and lubricating ointment as needed   Ophthalmic Meds Ordered this visit:  No orders of the defined types were placed in this encounter.      Return 6-9 months, for non-exu ARMD OU, DFE, OCT.  There are no Patient Instructions on file for this visit.   Explained the diagnoses, plan, and follow up with the patient and they expressed understanding.  Patient expressed understanding of the importance of proper follow up care.   This document serves as a record of services personally performed by Gardiner Sleeper, MD, PhD. It was created on their behalf by Ernest Mallick, OA, an ophthalmic assistant. The creation of this record is the provider's dictation and/or activities during the visit.    Electronically signed by: Ernest Mallick, OA  10.18.19 11:22 PM    Gardiner Sleeper, M.D., Ph.D. Diseases & Surgery of the Retina and Vitreous [facility]   I have reviewed the above documentation for accuracy and completeness, and I agree with the above. Aaron Edelman  Antony Haste, M.D., Ph.D. 04/02/18 11:22 PM     Abbreviations: M myopia (nearsighted); A astigmatism; H hyperopia (farsighted); P presbyopia; Mrx  spectacle prescription;  CTL contact lenses; OD right eye; OS left eye; OU both eyes  XT exotropia; ET esotropia; PEK punctate epithelial keratitis; PEE punctate epithelial erosions; DES dry eye syndrome; MGD meibomian gland dysfunction; ATs artificial tears; PFAT's preservative free artificial tears; [place] nuclear sclerotic cataract; PSC posterior subcapsular cataract; ERM epi-retinal membrane; PVD posterior vitreous detachment; RD retinal detachment; DM diabetes mellitus; DR diabetic retinopathy; NPDR non-proliferative diabetic retinopathy; PDR proliferative diabetic retinopathy; CSME clinically significant macular edema; DME diabetic macular edema; dbh dot blot hemorrhages; CWS cotton wool spot; POAG primary open angle glaucoma; C/D cup-to-disc ratio; HVF humphrey visual field; GVF goldmann visual field; OCT optical coherence tomography; IOP intraocular pressure; BRVO Branch retinal vein occlusion; CRVO central retinal vein occlusion; CRAO central retinal artery occlusion; BRAO branch retinal artery occlusion; RT retinal tear; SB scleral buckle; PPV pars plana vitrectomy; VH Vitreous hemorrhage; PRP panretinal laser photocoagulation; IVK intravitreal kenalog; VMT vitreomacular traction; MH Macular hole;  NVD neovascularization of the disc; NVE neovascularization elsewhere; AREDS age related eye disease study; ARMD age related macular degeneration; POAG primary open angle glaucoma; EBMD epithelial/anterior basement membrane dystrophy; ACIOL anterior chamber intraocular lens; IOL intraocular lens; PCIOL posterior chamber intraocular lens; Phaco/IOL phacoemulsification with intraocular lens placement; [place] photorefractive keratectomy; LASIK laser assisted in situ keratomileusis; HTN hypertension; DM diabetes mellitus; COPD chronic obstructive pulmonary disease

## 2018-04-02 ENCOUNTER — Ambulatory Visit (INDEPENDENT_AMBULATORY_CARE_PROVIDER_SITE_OTHER): Payer: Medicare Other | Admitting: Ophthalmology

## 2018-04-02 ENCOUNTER — Encounter (INDEPENDENT_AMBULATORY_CARE_PROVIDER_SITE_OTHER): Payer: Self-pay | Admitting: Ophthalmology

## 2018-04-02 DIAGNOSIS — H04123 Dry eye syndrome of bilateral lacrimal glands: Secondary | ICD-10-CM | POA: Diagnosis not present

## 2018-04-02 DIAGNOSIS — H35033 Hypertensive retinopathy, bilateral: Secondary | ICD-10-CM

## 2018-04-02 DIAGNOSIS — H3581 Retinal edema: Secondary | ICD-10-CM

## 2018-04-02 DIAGNOSIS — Z961 Presence of intraocular lens: Secondary | ICD-10-CM | POA: Diagnosis not present

## 2018-04-02 DIAGNOSIS — H353132 Nonexudative age-related macular degeneration, bilateral, intermediate dry stage: Secondary | ICD-10-CM | POA: Diagnosis not present

## 2018-04-02 DIAGNOSIS — I10 Essential (primary) hypertension: Secondary | ICD-10-CM

## 2018-04-17 ENCOUNTER — Encounter: Payer: Self-pay | Admitting: Podiatry

## 2018-04-17 ENCOUNTER — Ambulatory Visit (INDEPENDENT_AMBULATORY_CARE_PROVIDER_SITE_OTHER): Payer: Medicare Other | Admitting: Podiatry

## 2018-04-17 DIAGNOSIS — M79674 Pain in right toe(s): Secondary | ICD-10-CM

## 2018-04-17 DIAGNOSIS — I739 Peripheral vascular disease, unspecified: Secondary | ICD-10-CM | POA: Diagnosis not present

## 2018-04-17 DIAGNOSIS — B351 Tinea unguium: Secondary | ICD-10-CM | POA: Diagnosis not present

## 2018-04-17 DIAGNOSIS — M79675 Pain in left toe(s): Secondary | ICD-10-CM | POA: Diagnosis not present

## 2018-04-17 NOTE — Patient Instructions (Addendum)
PURCHASE LOTRIMIN CREAM AND APPLY TO LEFT GREAT TOE TWICE DAILY FOR 3 WEEKS  Fungal Nail Infection Fungal nail infection is a common fungal infection of the toenails or fingernails. This condition affects toenails more often than fingernails. More than one nail may be infected. The condition can be passed from person to person (is contagious). What are the causes? This condition is caused by a fungus. Several types of funguses can cause the infection. These funguses are common in moist and warm areas. If your hands or feet come into contact with the fungus, it may get into a crack in your fingernail or toenail and cause the infection. What increases the risk? The following factors may make you more likely to develop this condition:  Being female.  Having diabetes.  Being of older age.  Living with someone who has the fungus.  Walking barefoot in areas where the fungus thrives, such as showers or locker rooms.  Having poor circulation.  Wearing shoes and socks that cause your feet to sweat.  Having athlete's foot.  Having a nail injury or history of a recent nail surgery.  Having psoriasis.  Having a weak body defense system (immune system).  What are the signs or symptoms? Symptoms of this condition include:  A pale spot on the nail.  Thickening of the nail.  A nail that becomes yellow or brown.  A brittle or ragged nail edge.  A crumbling nail.  A nail that has lifted away from the nail bed.  How is this diagnosed? This condition is diagnosed with a physical exam. Your health care provider may take a scraping or clipping from your nail to test for the fungus. How is this treated? Mild infections do not need treatment. If you have significant nail changes, treatment may include:  Oral antifungal medicines. You may need to take the medicine for several weeks or several months, and you may not see the results for a long time. These medicines can cause side effects. Ask  your health care provider what problems to watch for.  Antifungal nail polish and nail cream. These may be used along with oral antifungal medicines.  Laser treatment of the nail.  Surgery to remove the nail. This may be needed for the most severe infections.  Treatment takes a long time, and the infection may come back. Follow these instructions at home: Medicines  Take or apply over-the-counter and prescription medicines only as told by your health care provider.  Ask your health care provider about using over-the-counter mentholated ointment on your nails. Lifestyle   Do not share personal items, such as towels or nail clippers.  Trim your nails often.  Wash and dry your hands and feet every day.  Wear absorbent socks, and change your socks frequently.  Wear shoes that allow air to circulate, such as sandals or canvas tennis shoes. Throw out old shoes.  Wear rubber gloves if you are working with your hands in wet areas.  Do not walk barefoot in shower rooms or locker rooms.  Do not use a nail salon that does not use clean instruments.  Do not use artificial nails. General instructions  Keep all follow-up visits as told by your health care provider. This is important.  Use antifungal foot powder on your feet and in your shoes. Contact a health care provider if: Your infection is not getting better or it is getting worse after several months. This information is not intended to replace advice given to you by  your health care provider. Make sure you discuss any questions you have with your health care provider. Document Released: 05/27/2000 Document Revised: 11/05/2015 Document Reviewed: 12/01/2014 Elsevier Interactive Patient Education  2018 [place] Athlete's foot (tinea pedis) is a fungal infection of the skin on the feet. It often occurs on the skin that is between or underneath the toes. It can also occur on the soles of the feet. The  infection can spread from person to person (is contagious). What are the causes? Athlete's foot is caused by a fungus. This fungus grows in warm, moist places. Most people get athlete's foot by sharing shower stalls, towels, and wet floors with someone who is infected. Not washing your feet or changing your socks often enough can contribute to athlete's foot. What increases the risk? This condition is more likely to develop in:  Men.  People who have a weak body defense system (immune system).  People who have diabetes.  People who use public showers, such as at a gym.  People who wear heavy-duty shoes, such as Environmental manager.  Seasons with warm, humid weather.  What are the signs or symptoms? Symptoms of this condition include:  Itchy areas between the toes or on the soles of the feet.  White, flaky, or scaly areas between the toes or on the soles of the feet.  Very itchy small blisters between the toes or on the soles of the feet.  Small cuts on the skin. These cuts can become infected.  Thick or discolored toenails.  How is this diagnosed? This condition is diagnosed with a medical history and physical exam. Your health care provider may also take a skin or toenail sample to be examined. How is this treated? Treatment for this condition includes antifungal medicines. These may be applied as powders, ointments, or creams. In severe cases, an oral antifungal medicine may be given. Follow these instructions at home:  Apply or take over-the-counter and prescription medicines only as told by your health care provider.  Keep all follow-up visits as told by your health care provider. This is important.  Do not scratch your feet.  Keep your feet dry: ? Wear cotton or wool socks. Change your socks every day or if they become wet. ? Wear shoes that allow air to circulate, such as sandals or canvas tennis shoes.  Wash and dry your feet: ? Every day or as told by  your health care provider. ? After exercising. ? Including the area between your toes.  Do not share towels, nail clippers, or other personal items that touch your feet with others.  If you have diabetes, keep your blood sugar under control. How is this prevented?  Do not share towels.  Wear sandals in wet areas, such as locker rooms and shared showers.  Keep your feet dry: ? Wear cotton or wool socks. Change your socks every day or if they become wet. ? Wear shoes that allow air to circulate, such as sandals or canvas tennis shoes.  Wash and dry your feet after exercising. Pay attention to the area between your toes. Contact a health care provider if:  You have a fever.  You have swelling, soreness, warmth, or redness in your foot.  You are not getting better with treatment.  Your symptoms get worse.  You have new symptoms. This information is not intended to replace advice given to you by your health care provider. Make sure you discuss any questions you have  with your health care provider. Document Released: 05/27/2000 Document Revised: 11/05/2015 Document Reviewed: 12/01/2014 Elsevier Interactive Patient Education  2018 [employer].

## 2018-05-03 ENCOUNTER — Other Ambulatory Visit: Payer: Self-pay

## 2018-05-05 NOTE — Progress Notes (Signed)
Subjective: [name] presents today for preventative foot care.  She complains of painful, discolored, thick toenails which interfere with daily activities.  Pain is aggravated when wearing enclosed shoe gear and relieved with periodic professional debridement.  Objective: Vascular Examination: Capillary refill time immediate to all 10 digits. Dorsalis pedis 1/4 bilateral Posterior tibial pulses 0/4 bilaterally Digital hair x 10 digits present bilaterally Skin temperature gradient within normal limits bilaterally Mild varicosities noted bilaterally  Dermatological Examination: Skin with normal turgor texture and tone bilaterally Toenails 1-5 b/l discolored, thick, dystrophic with subungual debris and pain with palpation to nailbeds due to thickness of nails. No interdigital macerations bilateral LE no open wounds bilaterally Mild scaling noted on left great toe   Musculoskeletal: Muscle strength 5/5 to all LE muscle groups  Neurological: Sensation intact with 10 gram monofilament. Vibratory sensation intact.  Assessment: Painful onychomycosis toenails 1-5 b/l  Tinea pedis left great toe  Plan: 1. Toenails 1-5 b/l were debrided in length and girth without iatrogenic bleeding. 2. For her left great toe she was instructed to purchase Lotrimin cream and and apply to left great toe twice daily for 3 weeks 3. Patient to continue soft, supportive shoe gear 4. Patient to report any pedal injuries to medical professional immediately. 5. Follow up 3 months. [name] to call should there be a concern in the interim.

## 2018-06-14 ENCOUNTER — Other Ambulatory Visit: Payer: Self-pay | Admitting: Internal Medicine

## 2018-06-21 ENCOUNTER — Ambulatory Visit (INDEPENDENT_AMBULATORY_CARE_PROVIDER_SITE_OTHER): Payer: Medicare Other | Admitting: Internal Medicine

## 2018-06-21 ENCOUNTER — Encounter: Payer: Self-pay | Admitting: Internal Medicine

## 2018-06-21 ENCOUNTER — Other Ambulatory Visit (INDEPENDENT_AMBULATORY_CARE_PROVIDER_SITE_OTHER): Payer: Medicare Other

## 2018-06-21 VITALS — BP 170/80 | HR 99 | Temp 98.0°F | Ht 64.0 in | Wt 126.0 lb

## 2018-06-21 DIAGNOSIS — Z23 Encounter for immunization: Secondary | ICD-10-CM

## 2018-06-21 DIAGNOSIS — J449 Chronic obstructive pulmonary disease, unspecified: Secondary | ICD-10-CM

## 2018-06-21 DIAGNOSIS — Z0000 Encounter for general adult medical examination without abnormal findings: Secondary | ICD-10-CM | POA: Diagnosis not present

## 2018-06-21 DIAGNOSIS — I10 Essential (primary) hypertension: Secondary | ICD-10-CM | POA: Diagnosis not present

## 2018-06-21 DIAGNOSIS — G8929 Other chronic pain: Secondary | ICD-10-CM | POA: Diagnosis not present

## 2018-06-21 DIAGNOSIS — E785 Hyperlipidemia, unspecified: Secondary | ICD-10-CM

## 2018-06-21 DIAGNOSIS — M545 Low back pain, unspecified: Secondary | ICD-10-CM

## 2018-06-21 LAB — COMPREHENSIVE METABOLIC PANEL
ALBUMIN: 3.9 g/dL (ref 3.5–5.2)
ALT: 8 U/L (ref 0–35)
AST: 16 U/L (ref 0–37)
Alkaline Phosphatase: 89 U/L (ref 39–117)
BILIRUBIN TOTAL: 0.5 mg/dL (ref 0.2–1.2)
BUN: 28 mg/dL — AB (ref 6–23)
CO2: 31 mEq/L (ref 19–32)
CREATININE: 1.33 mg/dL — AB (ref 0.40–1.20)
Calcium: 10.2 mg/dL (ref 8.4–10.5)
Chloride: 103 mEq/L (ref 96–112)
GFR: 39.9 mL/min — ABNORMAL LOW (ref >60.00)
Glucose, Bld: 92 mg/dL (ref 70–99)
Potassium: 3.9 mEq/L (ref 3.5–5.1)
SODIUM: 143 meq/L (ref 135–145)
Total Protein: 7.4 g/dL (ref 6.0–8.3)

## 2018-06-21 LAB — LIPID PANEL
CHOLESTEROL: 195 mg/dL (ref 0–200)
HDL: 57.6 mg/dL (ref >39.00)
NonHDL: 137.28
TRIGLYCERIDES: 201 mg/dL — AB (ref 0.0–149.0)
Total CHOL/HDL Ratio: 3
VLDL: 40.2 mg/dL — AB (ref 0.0–40.0)

## 2018-06-21 LAB — CBC
HCT: 42.2 % (ref 36.0–46.0)
Hemoglobin: 14.1 g/dL (ref 12.0–15.0)
MCHC: 33.5 g/dL (ref 30.0–36.0)
MCV: 89.6 fl (ref 78.0–100.0)
PLATELETS: 536 10*3/uL — AB (ref 150.0–400.0)
RBC: 4.71 Mil/uL (ref 3.87–5.11)
RDW: 13.8 % (ref 11.5–15.5)
WBC: 11.9 10*3/uL — AB (ref 4.0–10.5)

## 2018-06-21 LAB — LDL CHOLESTEROL, DIRECT: Direct LDL: 118 mg/dL

## 2018-06-21 NOTE — Assessment & Plan Note (Signed)
BP slightly above goal and no meds today. Home readings are acceptable. Taking amlodipine 10 mg daily, checking CMP and adjust as needed.

## 2018-06-21 NOTE — Assessment & Plan Note (Signed)
Flu shot given at visit. Pneumonia complete. Shingrix counseled. Tetanus up to date. Colonoscopy aged out. Mammogram aged out, pap smear aged out and dexa declines. Counseled about sun safety and mole surveillance. Counseled about the dangers of distracted driving. Given 10 year screening recommendations.

## 2018-06-21 NOTE — Assessment & Plan Note (Signed)
Checking lipid panel and adjust as needed. Diet controlled.  

## 2018-06-21 NOTE — Assessment & Plan Note (Signed)
Taking aleve and overall mildly progressive. No further imaging at this time indicated. She does not want any more intervention at this time.

## 2018-06-21 NOTE — Assessment & Plan Note (Signed)
Still taking breo and feels it is doing fine. Albuterol as needed for SOB.

## 2018-06-21 NOTE — Patient Instructions (Signed)

## 2018-06-21 NOTE — Progress Notes (Signed)
   Subjective:   Patient ID: [name], female    DOB: 02-Feb-1927, 85 y.o.   MRN: [id number]  HPI Here for medicare wellness, no new complaints. Please see A/P for status and treatment of chronic medical problems.   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   Diet: heart healthy Physical activity: sedentary Depression/mood screen: negative Hearing: intact to whispered voice, mild loss Visual acuity: grossly normal, performs annual eye exam  ADLs: capable with some assist Fall risk: low Home safety: good Cognitive evaluation: intact to orientation, naming, recall and repetition EOL planning: adv directives discussed  I have personally reviewed and have noted 1. The patient's medical and social history - reviewed today no changes 2. Their use of alcohol, tobacco or illicit drugs 3. Their current medications and supplements 4. The patient's functional ability including ADL's, fall risks, home safety risks and hearing or visual impairment. 5. Diet and physical activities 6. Evidence for depression or mood disorders 7. Care team reviewed and updated (available in snapshot)  Review of Systems  Constitutional: Negative.   HENT: Negative.   Eyes: Negative.   Respiratory: Negative for cough, chest tightness and shortness of breath.   Cardiovascular: Negative for chest pain, palpitations and leg swelling.  Gastrointestinal: Negative for abdominal distention, abdominal pain, constipation,  diarrhea, nausea and vomiting.  Musculoskeletal: Positive for arthralgias.  Skin: Negative.   Neurological: Negative.   Psychiatric/Behavioral: Negative.     Objective:  Physical Exam Constitutional:      Appearance: She is well-developed.  HENT:     Head: Normocephalic and atraumatic.  Neck:     Musculoskeletal: Normal range of motion.  Cardiovascular:     Rate and Rhythm: Normal rate and regular rhythm.  Pulmonary:     Effort: Pulmonary effort is normal. No respiratory distress.     Breath sounds: Normal breath sounds. No wheezing or rales.  Abdominal:     General: Bowel sounds are normal. There is no distension.     Palpations: Abdomen is soft.     Tenderness: There is no abdominal tenderness. There is no rebound.  Musculoskeletal:        General: Tenderness present.     Comments: Low back pain  Skin:    General: Skin is warm and dry.  Neurological:     Mental Status: She is alert and oriented to person, place, and time.     Coordination: Coordination normal.     Vitals:   06/21/18 1034  BP: (!) 170/80  Pulse: 99  Temp: 98 F (36.7 C)  TempSrc: Oral  SpO2: 97%  Weight: 126 lb (57.2 kg)  Height: 5\' 4"  (1.626 m)    Assessment & Plan:  Flu shot given at visit

## 2018-07-17 ENCOUNTER — Encounter: Payer: Self-pay | Admitting: Podiatry

## 2018-07-17 ENCOUNTER — Ambulatory Visit (INDEPENDENT_AMBULATORY_CARE_PROVIDER_SITE_OTHER): Payer: Medicare Other | Admitting: Podiatry

## 2018-07-17 DIAGNOSIS — B351 Tinea unguium: Secondary | ICD-10-CM

## 2018-07-17 DIAGNOSIS — M79675 Pain in left toe(s): Secondary | ICD-10-CM

## 2018-07-17 DIAGNOSIS — B353 Tinea pedis: Secondary | ICD-10-CM

## 2018-07-17 DIAGNOSIS — M79674 Pain in right toe(s): Secondary | ICD-10-CM | POA: Diagnosis not present

## 2018-07-17 MED ORDER — CLOTRIMAZOLE 1 % EX CREA: TOPICAL_CREAM | CUTANEOUS | 1 refills | Status: DC

## 2018-07-17 NOTE — Patient Instructions (Addendum)
Athlete's Foot  Athlete's foot (tinea pedis) is a fungal infection of the skin on your feet. It often occurs on the skin that is between or underneath the toes. It can also occur on the soles of your feet. The infection can spread from person to person (is contagious). It can also spread when a person's bare feet come in contact with the fungus on shower floors or on items such as shoes. What are the causes? This condition is caused by a fungus that grows in warm, moist places. You can get athlete's foot by sharing shoes, shower stalls, towels, and wet floors with someone who is infected. Not washing your feet or changing your socks often enough can also lead to athlete's foot. What increases the risk? This condition is more likely to develop in:  Men.  People who have a weak body defense system (immune system).  People who have diabetes.  People who use public showers, such as at a gym.  People who wear heavy-duty shoes, such as industrial or military shoes.  Seasons with warm, humid weather. What are the signs or symptoms? Symptoms of this condition include:  Itchy areas between your toes or on the soles of your feet.  White, flaky, or scaly areas between your toes or on the soles of your feet.  Very itchy small blisters between your toes or on the soles of your feet.  Small cuts in your skin. These cuts can become infected.  Thick or discolored toenails. How is this diagnosed? This condition may be diagnosed with a physical exam and a review of your medical history. Your health care provider may also take a skin or toenail sample to examine under a microscope. How is this treated? This condition is treated with antifungal medicines. These may be applied as powders, ointments, or creams. In severe cases, an oral antifungal medicine may be given. Follow these instructions at home: Medicines  Apply or take over-the-counter and prescription medicines only as told by your health  care provider.  Apply your antifungal medicine as told by your health care provider. Do not stop using the antifungal even if your condition improves. Foot care  Do not scratch your feet.  Keep your feet dry: ? Wear cotton or wool socks. Change your socks every day or if they become wet. ? Wear shoes that allow air to flow, such as sandals or canvas tennis shoes.  Wash and dry your feet, including the area between your toes. Also, wash and dry your feet: ? Every day or as told by your health care provider. ? After exercising. General instructions  Do not let others use towels, shoes, nail clippers, or other personal items that touch your feet.  Protect your feet by wearing sandals in wet areas, such as locker rooms and shared showers.  Keep all follow-up visits as told by your health care provider. This is important.  If you have diabetes, keep your blood sugar under control. Contact a health care provider if:  You have a fever.  You have swelling, soreness, warmth, or redness in your foot.  Your feet are not getting better with treatment.  Your symptoms get worse.  You have new symptoms. Summary  Athlete's foot (tinea pedis) is a fungal infection of the skin on your feet. It often occurs on skin that is between or underneath the toes.  This condition is caused by a fungus that grows in warm, moist places.  Symptoms include white, flaky, or scaly areas between   your toes or on the soles of your feet.  This condition is treated with antifungal medicines.  Keep your feet clean. Always dry them thoroughly. This information is not intended to replace advice given to you by your health care provider. Make sure you discuss any questions you have with your health care provider. Document Released: 05/27/2000 Document Revised: 03/20/2017 Document Reviewed: 03/20/2017 Elsevier Interactive Patient Education  2019 [employer]. Onychomycosis/Fungal Toenails  WHAT IS IT? An  infection that lies within the keratin of your nail plate that is caused by a fungus.  WHY ME? Fungal infections affect all ages, sexes, races, and creeds.  There may be many factors that predispose you to a fungal infection such as age, coexisting medical conditions such as diabetes, or an autoimmune disease; stress, medications, fatigue, genetics, etc.  Bottom line: fungus thrives in a warm, moist environment and your shoes offer such a location.  IS IT CONTAGIOUS? Theoretically, yes.  You do not want to share shoes, nail clippers or files with someone who has fungal toenails.  Walking around barefoot in the same room or sleeping in the same bed is unlikely to transfer the organism.  It is important to realize, however, that fungus can spread easily from one nail to the next on the same foot.  HOW DO WE TREAT THIS?  There are several ways to treat this condition.  Treatment may depend on many factors such as age, medications, pregnancy, liver and kidney conditions, etc.  It is best to ask your doctor which options are available to you.  1. No treatment.   Unlike many other medical concerns, you can live with this condition.  However for many people this can be a painful condition and may lead to ingrown toenails or a bacterial infection.  It is recommended that you keep the nails cut short to help reduce the amount of fungal nail. 2. Topical treatment.  These range from herbal remedies to prescription strength nail lacquers.  About 40-50% effective, topicals require twice daily application for approximately 9 to 12 months or until an entirely new nail has grown out.  The most effective topicals are medical grade medications available through physicians offices. 3. Oral antifungal medications.  With an 80-90% cure rate, the most common oral medication requires 3 to 4 months of therapy and stays in your system for a year as the new nail grows out.  Oral antifungal medications do require blood work to make  sure it is a safe drug for you.  A liver function panel will be performed prior to starting the medication and after the first month of treatment.  It is important to have the blood work performed to avoid any harmful side effects.  In general, this medication safe but blood work is required. 4. Laser Therapy.  This treatment is performed by applying a specialized laser to the affected nail plate.  This therapy is noninvasive, fast, and non-painful.  It is not covered by insurance and is therefore, out of pocket.  The results have been very good with a 80-95% cure rate.  [facility] is the only practice in the area to offer this therapy. 5. Permanent Nail Avulsion.  Removing the entire nail so that a new nail will not grow back. 

## 2018-07-17 NOTE — Progress Notes (Signed)
Subjective: [name] presents today follow up of painful, discolored, thick toenails which interfere with daily activities.  Pain is aggravated when wearing enclosed shoe gear and relieved with periodic professional debridement.  She voices no new problems on today's visit.  Objective: Vascular Examination: Capillary refill time immediate to all 10 digits. Dorsalis pedis 1/4 bilateral Posterior tibial pulses 0/4 bilaterally Digital hair x 10 digits present bilaterally Skin temperature gradient within normal limits bilaterally Mild varicosities noted bilaterally  Dermatological Examination: Skin with normal turgor texture and tone bilaterally Toenails 1-5 b/l discolored, thick, dystrophic with subungual debris and pain with palpation to nailbeds due to thickness of nails. No interdigital macerations bilateral LE no open wounds bilaterally Yellow scaling noted interdigitally webspaces 2-4 right foot and webspaces 2, 3 left foot. No blistering, no weeping, no open wounds   Musculoskeletal: Muscle strength 5/5 to all LE muscle groups  Neurological: Sensation intact with 10 gram monofilament. Vibratory sensation intact.  Assessment: Painful onychomycosis toenails 1-5 b/l  Tinea pedis b/l  Plan: 1. Toenails 1-5 b/l were debrided in length and girth without iatrogenic bleeding. Rx for Clotrimazole Cream 1% to be applied to both feet and between toes bid x 4 weeks. 2. Patient to continue soft, supportive shoe gear 3. Patient to report any pedal injuries to medical professional immediately. 4. Follow up 3 months. [name] to call should there be a concern in the interim.

## 2018-08-13 ENCOUNTER — Other Ambulatory Visit: Payer: Self-pay | Admitting: Internal Medicine

## 2018-10-17 ENCOUNTER — Ambulatory Visit: Payer: Medicare Other | Admitting: Podiatry

## 2018-11-26 ENCOUNTER — Encounter: Payer: Self-pay | Admitting: Podiatry

## 2018-11-26 ENCOUNTER — Ambulatory Visit (INDEPENDENT_AMBULATORY_CARE_PROVIDER_SITE_OTHER): Payer: Medicare Other | Admitting: Podiatry

## 2018-11-26 ENCOUNTER — Other Ambulatory Visit: Payer: Self-pay

## 2018-11-26 VITALS — Temp 98.0°F

## 2018-11-26 DIAGNOSIS — M79674 Pain in right toe(s): Secondary | ICD-10-CM | POA: Diagnosis not present

## 2018-11-26 DIAGNOSIS — B351 Tinea unguium: Secondary | ICD-10-CM | POA: Diagnosis not present

## 2018-11-26 DIAGNOSIS — M79675 Pain in left toe(s): Secondary | ICD-10-CM | POA: Diagnosis not present

## 2018-11-26 NOTE — Patient Instructions (Signed)

## 2018-12-06 NOTE — Progress Notes (Signed)
Subjective:  [name] presents to clinic today with cc of  painful, thick, discolored, elongated toenails 1-5 b/l that become tender and cannot cut because of thickness. Pain is aggravated when wearing enclosed shoe gear.  Hoyt Koch, MD is her PCP. Last visit was 06/21/2018.   Current Outpatient Medications:  .  amLODipine (NORVASC) 10 MG tablet, TAKE 1 TABLET BY MOUTH DAILY, Disp: 90 tablet, Rfl: 3 .  BREO ELLIPTA 100-25 MCG/INH AEPB, INHALE 1 PUFF INTO THE LUNGS DAILY, Disp: 60 each, Rfl: 1 .  clotrimazole (CLOTRIMAZOLE ANTI-FUNGAL) 1 % cream, Apply to both feet and between toes twice daily for 4 weeks, Disp: 30 g, Rfl: 1 .  lidocaine (LIDODERM) 5 %, Place 1 patch onto the skin daily. Remove & Discard patch within 12 hours or as directed by MD, Disp: 30 patch, Rfl: 0 .  Multiple Vitamins-Minerals (CENTRUM SILVER) tablet, Take 1 tablet by mouth daily., Disp: , Rfl:  .  naproxen sodium (ANAPROX) 220 MG tablet, Take 220 mg by mouth 2 (two) times daily with a meal., Disp: , Rfl:  .  nystatin-triamcinolone ointment (MYCOLOG), Apply 1 application topically 2 (two) times daily., Disp: 30 g, Rfl: 0 .  RESTASIS 0.05 % ophthalmic emulsion, Apply to eye 2 (two) times daily., Disp: , Rfl:  .  VENTOLIN HFA 108 (90 Base) MCG/ACT inhaler, INHALE 2 PUFFS INTO THE LUNGS EVERY 6 HOURS AS NEEDED FOR WHEEZING ORSHORTNESS OF BREATH, Disp: 18 g, Rfl: 1   Allergies  Allergen Reactions  . Prednisone     Patient became jittery all over  . Codeine      Objective: Vitals:   11/26/18 1136  Temp: 98 F (36.7 C)    Physical Examination:  Vascular Examination: Capillary refill time immediate x 10 digits.  DP pulses 1/4 b/l.  PT pulses 0/4 b/l.  Digital hair present b/l.  No edema noted b/l.  Skin temperature gradient WNL b/l.  Mild varicosities b/l LE>  Dermatological Examination: Skin with normal turgor, texture and tone b/l.  No open wounds b/l.  No interdigital  macerations noted b/l.  Elongated, thick, discolored brittle toenails with subungual debris and pain on dorsal palpation of nailbeds 1-5 b/l.  Musculoskeletal Examination: Muscle strength 5/5 to all muscle groups b/l  No pain, crepitus or joint discomfort with active/passive ROM.  Neurological Examination: Sensation intact 5/5 b/l with 10 gram monofilament.  Vibratory sensation intact b/l.  Proprioceptive sensation intact b/l.  Assessment: Mycotic nail infection with pain 1-5 b/l  Plan: 1. Toenails 1-5 b/l were debrided in length and girth without iatrogenic laceration. 2.  Continue soft, supportive shoe gear daily. 3.  Report any pedal injuries to medical professional. 4.  Follow up 3 months. 5.  [name] to call should there be a question/concern in there interim.

## 2018-12-24 ENCOUNTER — Other Ambulatory Visit: Payer: Self-pay | Admitting: Internal Medicine

## 2019-02-05 DIAGNOSIS — H16223 Keratoconjunctivitis sicca, not specified as Sjogren's, bilateral: Secondary | ICD-10-CM | POA: Diagnosis not present

## 2019-02-27 ENCOUNTER — Encounter: Payer: Self-pay | Admitting: Podiatry

## 2019-02-27 ENCOUNTER — Other Ambulatory Visit: Payer: Self-pay

## 2019-02-27 ENCOUNTER — Ambulatory Visit (INDEPENDENT_AMBULATORY_CARE_PROVIDER_SITE_OTHER): Payer: Medicare Other | Admitting: Podiatry

## 2019-02-27 DIAGNOSIS — B351 Tinea unguium: Secondary | ICD-10-CM

## 2019-02-27 DIAGNOSIS — M79674 Pain in right toe(s): Secondary | ICD-10-CM

## 2019-02-27 DIAGNOSIS — M79675 Pain in left toe(s): Secondary | ICD-10-CM | POA: Diagnosis not present

## 2019-02-27 NOTE — Patient Instructions (Addendum)
APPLY ANTIBIOTIC OINTMENT TO RIGHT GREAT TOENAIL DAILY FOR ONE WEEK  Onychomycosis/Fungal Toenails  WHAT IS IT? An infection that lies within the keratin of your nail plate that is caused by a fungus.  WHY ME? Fungal infections affect all ages, sexes, races, and creeds.  There may be many factors that predispose you to a fungal infection such as age, coexisting medical conditions such as diabetes, or an autoimmune disease; stress, medications, fatigue, genetics, etc.  Bottom line: fungus thrives in a warm, moist environment and your shoes offer such a location.  IS IT CONTAGIOUS? Theoretically, yes.  You do not want to share shoes, nail clippers or files with someone who has fungal toenails.  Walking around barefoot in the same room or sleeping in the same bed is unlikely to transfer the organism.  It is important to realize, however, that fungus can spread easily from one nail to the next on the same foot.  HOW DO WE TREAT THIS?  There are several ways to treat this condition.  Treatment may depend on many factors such as age, medications, pregnancy, liver and kidney conditions, etc.  It is best to ask your doctor which options are available to you.  1. No treatment.   Unlike many other medical concerns, you can live with this condition.  However for many people this can be a painful condition and may lead to ingrown toenails or a bacterial infection.  It is recommended that you keep the nails cut short to help reduce the amount of fungal nail. 2. Topical treatment.  These range from herbal remedies to prescription strength nail lacquers.  About 40-50% effective, topicals require twice daily application for approximately 9 to 12 months or until an entirely new nail has grown out.  The most effective topicals are medical grade medications available through physicians offices. 3. Oral antifungal medications.  With an 80-90% cure rate, the most common oral medication requires 3 to 4 months of therapy and  stays in your system for a year as the new nail grows out.  Oral antifungal medications do require blood work to make sure it is a safe drug for you.  A liver function panel will be performed prior to starting the medication and after the first month of treatment.  It is important to have the blood work performed to avoid any harmful side effects.  In general, this medication safe but blood work is required. 4. Laser Therapy.  This treatment is performed by applying a specialized laser to the affected nail plate.  This therapy is noninvasive, fast, and non-painful.  It is not covered by insurance and is therefore, out of pocket.  The results have been very good with a 80-95% cure rate.  [facility] is the only practice in the area to offer this therapy. 5. Permanent Nail Avulsion.  Removing the entire nail so that a new nail will not grow back.

## 2019-03-05 NOTE — Progress Notes (Signed)
Subjective: [name] is seen today for follow up painful, elongated, thickened toenails 1-5 b/l feet that she cannot cut. Pain interferes with daily activities. Aggravating factor includes wearing enclosed shoe gear and relieved with periodic debridement.  Current Outpatient Medications on File Prior to Visit  Medication Sig  . amLODipine (NORVASC) 10 MG tablet TAKE 1 TABLET BY MOUTH DAILY  . BREO ELLIPTA 100-25 MCG/INH AEPB INHALE 1 PUFF INTO THE LUNGS DAILY  . clotrimazole (CLOTRIMAZOLE ANTI-FUNGAL) 1 % cream Apply to both feet and between toes twice daily for 4 weeks  . lidocaine (LIDODERM) 5 % Place 1 patch onto the skin daily. Remove & Discard patch within 12 hours or as directed by MD  . Multiple Vitamins-Minerals (CENTRUM SILVER) tablet Take 1 tablet by mouth daily.  . naproxen sodium (ANAPROX) 220 MG tablet Take 220 mg by mouth 2 (two) times daily with a meal.  . nystatin-triamcinolone ointment (MYCOLOG) Apply 1 application topically 2 (two) times daily.  . RESTASIS 0.05 % ophthalmic emulsion Apply to eye 2 (two) times daily.  . VENTOLIN HFA 108 (90 Base) MCG/ACT inhaler INHALE 2 PUFFS INTO THE LUNGS EVERY 6 HOURS AS NEEDED FOR WHEEZING ORSHORTNESS OF BREATH   No current facility-administered medications on file prior to visit.      Allergies  Allergen Reactions  . Prednisone     Patient became jittery all over  . Codeine      Objective:  Vascular Examination: Capillary refill time immediate x 10 digits.  Dorsalis pedis pulses 1/4 b/l.  Posterior tibial pulses 0/4 b/l.  Digital hair present x 10 digits.  Skin temperature gradient WNL b/l.   Mild varicosities b/l LE.  Dermatological Examination: Skin with normal turgor, texture and tone b/l.  Toenails 1-5 b/l discolored, thick, dystrophic with subungual debris and pain with palpation to nailbeds due to thickness of nails.  Incurvated nailplate right great toe lateral border with tenderness to palpation. No  erythema, no edema, no drainage noted.  Musculoskeletal: Muscle strength 5/5 b/l  to all LE muscle groups b/l.   No gross bony deformities b/l.  No pain, crepitus or joint limitation noted with ROM.   Neurological Examination: Protective sensation intact 5/5 b/l with 10 gram monofilament bilaterally.  Epicritic sensation present bilaterally.  Vibratory sensation intact bilaterally.   Assessment: Painful onychomycosis toenails 1-5 b/l  Ingrown toenail right hallux  Plan: 1. Toenails 1-5 b/l were debrided in length and girth. Offending nail border debrided and curretaged right hallux. Light bleeding addressed with Lumicain Hemostatic Solution.  Border cleansed with alcohol and triple antibiotic applied. Patient instructed to apply triple antibiotic ointment to right great toe once daily for one week. 2. Patient to continue soft, supportive shoe gear. 3. Patient to report any pedal injuries to medical professional immediately. 4. Follow up 3 months.  5. [name] to call should there be a concern in the interim.

## 2019-03-11 ENCOUNTER — Other Ambulatory Visit: Payer: Self-pay | Admitting: *Deleted

## 2019-03-11 MED ORDER — BREO ELLIPTA 100-25 MCG/INH IN AEPB: INHALATION_SPRAY | RESPIRATORY_TRACT | 1 refills | Status: DC

## 2019-03-18 ENCOUNTER — Other Ambulatory Visit: Payer: Self-pay | Admitting: Internal Medicine

## 2019-04-11 ENCOUNTER — Ambulatory Visit (INDEPENDENT_AMBULATORY_CARE_PROVIDER_SITE_OTHER)
Admission: RE | Admit: 2019-04-11 | Discharge: 2019-04-11 | Disposition: A | Discharge status: Home / Self Care | Payer: Medicare Other | Source: Ambulatory Visit | Attending: Internal Medicine | Admitting: Internal Medicine

## 2019-04-11 ENCOUNTER — Ambulatory Visit (INDEPENDENT_AMBULATORY_CARE_PROVIDER_SITE_OTHER): Payer: Medicare Other | Admitting: Internal Medicine

## 2019-04-11 ENCOUNTER — Encounter: Payer: Self-pay | Admitting: Internal Medicine

## 2019-04-11 ENCOUNTER — Other Ambulatory Visit: Payer: Self-pay

## 2019-04-11 VITALS — BP 118/84 | HR 106 | Temp 98.4°F | Ht 64.0 in | Wt 132.0 lb

## 2019-04-11 DIAGNOSIS — Z23 Encounter for immunization: Secondary | ICD-10-CM | POA: Diagnosis not present

## 2019-04-11 DIAGNOSIS — M25551 Pain in right hip: Secondary | ICD-10-CM | POA: Diagnosis not present

## 2019-04-11 DIAGNOSIS — S79911A Unspecified injury of right hip, initial encounter: Secondary | ICD-10-CM | POA: Diagnosis not present

## 2019-04-11 NOTE — Assessment & Plan Note (Signed)
Ordered hip x-ray to rule out occult fracture and quantify arthritis. Prior left hip replacement. Pain for months in the right hip region.

## 2019-04-11 NOTE — Progress Notes (Signed)
   Subjective:   Patient ID: [name], female    DOB: 05/25/1927, 85 y.o.   MRN: [id number]  HPI The patient is a 85 YO female coming in for concerns about pain in right side. Started months ago per daughter. She does not complain much about pain but states that this hurts. Painful more with standing or walking. Chronic low back pain which is stable. Had fall yesterday in bathroom but no prior falls. Denies injury with fall except on the right elbow and scalp with small cut.. Denies numbness in leg and with chronic lumbar radiculopathy which is unchanged recently. Overall it is stable. Has tried aleve in the mornings which helps some. Tried lidoderm patches for the back and these do not seem to help much.  Review of Systems  Constitutional: Positive for activity change.  HENT: Negative.   Eyes: Negative.   Respiratory: Negative for cough, chest tightness and shortness of breath.   Cardiovascular: Negative for chest pain, palpitations and leg swelling.  Gastrointestinal: Negative for abdominal distention, abdominal pain, constipation, diarrhea, nausea and vomiting.  Musculoskeletal: Positive for arthralgias, back pain, gait problem and myalgias.  Skin: Positive for wound.  Neurological: Negative for dizziness, tremors and numbness.  Psychiatric/Behavioral: Negative.     Objective:  Physical Exam Constitutional:      Appearance: She is well-developed.  HENT:     Head: Normocephalic and atraumatic.  Neck:     Musculoskeletal: Normal range of motion.  Cardiovascular:     Rate and Rhythm: Normal rate and regular rhythm.  Pulmonary:     Effort: Pulmonary effort is normal. No respiratory distress.     Breath sounds: Normal breath sounds. No wheezing or rales.  Abdominal:     General: Bowel sounds are normal. There is no distension.     Palpations: Abdomen is soft.     Tenderness: There is no abdominal tenderness. There is no rebound.  Musculoskeletal:        General: Tenderness  present.  Skin:    General: Skin is warm and dry.     Comments: Pain in the right groin area, stable pain in the low back  Neurological:     Mental Status: She is alert and oriented to person, place, and time.     Coordination: Coordination abnormal.     Comments: Cane for ambulation but in wheelchair today     Vitals:   04/11/19 1010  BP: 118/84  Pulse: (!) 106  Temp: 98.4 F (36.9 C)  TempSrc: Oral  SpO2: 96%  Weight: 132 lb (59.9 kg)  Height: 5\' 4"  (1.626 m)    Assessment & Plan:  Flu shot given at visit

## 2019-04-18 ENCOUNTER — Telehealth: Payer: Self-pay | Admitting: Internal Medicine

## 2019-04-18 MED ORDER — LIDOCAINE 5 % EX PTCH: 1.0000 | MEDICATED_PATCH | CUTANEOUS | 0 refills | Status: DC

## 2019-04-18 NOTE — Telephone Encounter (Signed)
Appointment? Pt was seen on 04/11/2019

## 2019-04-18 NOTE — Telephone Encounter (Signed)
Informed daughter of the imaging results states that she understands about the kidney stones, but this is a new pain when daughter asks mom about the pain she points to her coccyx where she had fallen. Unable to sit or move. Is able to sit in her recliner but when she tries to get up she has a very hard time. Patient stated to sit down and stand up to go to the bathroom hurts really bad. Daughter placed the higher seat back on the toilet. Daughter is giving patient tylenol but it is not helping as much as they would like.    Daughter wanting to know if there is something similar to back injections that can be done for the coccyx. Did the x-ray see the coccyx if so was there a fracture there or not?

## 2019-04-18 NOTE — Telephone Encounter (Signed)
Patient daughter informed of MD response and will call back and let us know if they want to go the spine specialist route. Will try the lidocaine patches to see if they help

## 2019-04-18 NOTE — Telephone Encounter (Signed)
Can do lidocaine patch for pain, sent that in. The x-ray did see the pelvis and the low back also so would have shown a fracture in the coccyx region. We could get her in with a spine specialist if they want to try injections potentially.

## 2019-04-18 NOTE — Telephone Encounter (Signed)
Please see phone note as there are outstanding results which could be causing her pain.

## 2019-04-18 NOTE — Telephone Encounter (Signed)
Patient's daughter Cherie Ouch is calling for advice. The patient fell in the bathroom 04/10/2019. Have concerns - xrays where completed on 04/11/2019- Pain is expressed by the patient so bad that she is unable to sit or move. Unsure what can be done?  Please advise [phone]  Preferred Pharmacy of the Patient- [facility].

## 2019-04-24 ENCOUNTER — Telehealth: Payer: Self-pay

## 2019-04-24 ENCOUNTER — Encounter: Payer: Self-pay | Admitting: Internal Medicine

## 2019-04-24 DIAGNOSIS — R109 Unspecified abdominal pain: Secondary | ICD-10-CM

## 2019-04-24 NOTE — Telephone Encounter (Signed)
Daughter informed that the CT scan was ordered. Daughter would like for [place] to call her number to schedule, I have changed the number in patients chart to daughters number.

## 2019-04-24 NOTE — Telephone Encounter (Signed)
Tried calling both numbers and it kept saying all circuits are busy. Routing to [facility] incase patient or daughter calls back

## 2019-04-24 NOTE — Telephone Encounter (Signed)
Copied from [id number]. Topic: General - Inquiry >> Apr 24, 2019 11:44 AM Mathis Bud wrote: Reason for CRM: Daughter of patient is requesting patient to get CT scan done. Call back [phone]

## 2019-04-24 NOTE — Telephone Encounter (Signed)
Order placed

## 2019-04-24 NOTE — Addendum Note (Signed)
Addended by: Pricilla Holm A on: 04/24/2019 12:17 PM   Modules accepted: Orders

## 2019-04-26 ENCOUNTER — Other Ambulatory Visit: Payer: Self-pay

## 2019-04-26 ENCOUNTER — Encounter: Payer: Self-pay | Admitting: Internal Medicine

## 2019-04-26 ENCOUNTER — Telehealth: Payer: Self-pay

## 2019-04-26 ENCOUNTER — Ambulatory Visit
Admission: RE | Admit: 2019-04-26 | Discharge: 2019-04-26 | Disposition: A | Discharge status: Home / Self Care | Payer: Medicare Other | Source: Ambulatory Visit | Attending: Internal Medicine | Admitting: Internal Medicine

## 2019-04-26 DIAGNOSIS — S3991XA Unspecified injury of abdomen, initial encounter: Secondary | ICD-10-CM | POA: Diagnosis not present

## 2019-04-26 DIAGNOSIS — R109 Unspecified abdominal pain: Secondary | ICD-10-CM

## 2019-04-26 MED ORDER — HYDROCODONE-ACETAMINOPHEN 5-325 MG PO TABS: 1.0000 | ORAL_TABLET | Freq: Four times a day (QID) | ORAL | 0 refills | Status: DC | PRN

## 2019-04-26 NOTE — Telephone Encounter (Signed)
I have talked with [name]/daughter----with patient's kidney function last lab results, not sure if anything else would be safe for her to take---her CT scan today should hopefully be a view that will show her hip----in case a fracture was not seen with first xray (due to swelling,etc), today's CT may be able to see things a little better---daughter advised to go to [facility] or [facility] if patient is not tolerating pain---ct results will probably not be seen until Monday by our office--daughter agrees with plan

## 2019-04-26 NOTE — Telephone Encounter (Signed)
Very sorry, I think I missed this message in time.

## 2019-04-26 NOTE — Telephone Encounter (Signed)
It appears I was not able to see this note in time prior to the CT scan.  You may wish to let the family know we cannot respond to messages each time within a short time such as 4 hrs.  We normally allow for 3 business days to provide controlled substances.  Sounds as if she may need to f/u with Dr Sharlet Salina with a better plan for the CT if she is not able to complete the testing today

## 2019-04-26 NOTE — Telephone Encounter (Signed)
Stacy with [facility] called with STAT CT results. Carson in the practice notified and will forward.

## 2019-04-26 NOTE — Telephone Encounter (Signed)
Done erx 

## 2019-04-26 NOTE — Telephone Encounter (Signed)
Please see STAT CT results in Epic. Jonelle Sidle, can you assist with this?

## 2019-04-26 NOTE — Telephone Encounter (Signed)
Copied from [place] [phone]. Topic: General - Other >> Apr 26, 2019 10:01 AM Yvette Rack wrote: Reason for CRM: Pt daughter Rosann Auerbach requests that a pain medication be prescribed as pt is scheduled to have CT scan today at 12:20 and the Tylenol, Aleve, and pain patches are not working. Rosann Auerbach stated she is afraid pt may not be able to go through with CT scan due to all the pain and requests a call back.  Routing to dr Jenny Reichmann, please advise if you are ok with doing this in the absence of dr crawford, I will call patient's daughter back, thanks

## 2019-04-26 NOTE — Telephone Encounter (Signed)
Routing to dr [name], patient's daughter has been advised of interpretation given to Beverly Stege,RN by dr john----she would like for dr [name] to send in the lowest dosage possible for hydrocodone to [place] as he has recommended----patient's daughter has scheduled follow up to talk with dr crawford about ct result findings---please send in low dose hydrocodone to [facility]

## 2019-05-02 ENCOUNTER — Telehealth: Payer: Self-pay | Admitting: Internal Medicine

## 2019-05-02 NOTE — Telephone Encounter (Signed)
LVM with patients daughter with MD response

## 2019-05-02 NOTE — Telephone Encounter (Signed)
This would be considered an emergency with the loss of bowels and she should go to ER as she may need different imaging to see if there is compression of the spinal cord.

## 2019-05-02 NOTE — Telephone Encounter (Signed)
Pt now has a new issue and has loss bowel control and her daughter is concerned if there is nerve involvement from when she fell three weeks ago. Please give daughter a call Cherie Ouch) at [phone]

## 2019-05-03 ENCOUNTER — Other Ambulatory Visit: Payer: Self-pay

## 2019-05-03 ENCOUNTER — Encounter: Payer: Self-pay | Admitting: Internal Medicine

## 2019-05-03 ENCOUNTER — Ambulatory Visit (INDEPENDENT_AMBULATORY_CARE_PROVIDER_SITE_OTHER): Payer: Medicare Other | Admitting: Internal Medicine

## 2019-05-03 ENCOUNTER — Telehealth: Payer: Self-pay

## 2019-05-03 DIAGNOSIS — S3210XA Unspecified fracture of sacrum, initial encounter for closed fracture: Secondary | ICD-10-CM | POA: Insufficient documentation

## 2019-05-03 DIAGNOSIS — S322XXA Fracture of coccyx, initial encounter for closed fracture: Secondary | ICD-10-CM | POA: Diagnosis not present

## 2019-05-03 DIAGNOSIS — R159 Full incontinence of feces: Secondary | ICD-10-CM

## 2019-05-03 MED ORDER — CALCITONIN (SALMON) 200 UNIT/ACT NA SOLN: 1.0000 | Freq: Every day | NASAL | 12 refills | Status: DC

## 2019-05-03 NOTE — Telephone Encounter (Signed)
Called patient's daughter and left a message informing.  Also called son and left a message (he is on [place]).

## 2019-05-03 NOTE — Telephone Encounter (Signed)
Copied from [place] [phone]. Topic: General - Other >> May 03, 2019  8:50 AM Carolyn Stare wrote: Pt daughter Rosann Auerbach call to req a xray, ct or mri  of pt back and hip to see if there is a injury to her spine from a fall

## 2019-05-03 NOTE — Telephone Encounter (Signed)
Ive tried calling patients daughter twice to tell her again that this is an emergency and mother needs to go to the ER. Can you call again for me in a little bit to see if she answers. Thank you

## 2019-05-03 NOTE — Assessment & Plan Note (Signed)
MRI lumbar to rule out spinal impingement. Rx calcitonin nasal spray. Continue hydrocodone using 1/2 pill up to TID for pain.

## 2019-05-03 NOTE — Telephone Encounter (Signed)
Please see prior note, needs to go to ER for new bowel incontinence as this is an emergency situation.

## 2019-05-03 NOTE — Telephone Encounter (Signed)
Noted thanks °

## 2019-05-03 NOTE — Assessment & Plan Note (Signed)
Ordered MRI lumbar spine to rule out spinal impingement.

## 2019-05-03 NOTE — Progress Notes (Signed)
Virtual Visit via Video Note  I connected with [name] on 05/03/19 at  3:40 PM EST by a video enabled telemedicine application and verified that I am speaking with the correct person using two identifiers.  The patient and the provider were at separate locations throughout the entire encounter.   I discussed the limitations of evaluation and management by telemedicine and the availability of in person appointments. The patient expressed understanding and agreed to proceed. The patient and the provider were the only parties present for the visit unless noted in HPI below.  History of Present Illness: The patient is a 85 y.o. female with visit for back pain and new fecal incontinence. Her daughter is also present and helps to provide the history. She had Ct abdomen to evaluate which showed sacral fracture, advanced lumbar disease, right hip arthritis, kidney stones, renal cell carcinoma. Started several weeks ago with a fall and the pain. She has been having some intermittent bowel incontinence in the last week or so. Mostly some spotting of fecal material. Daughter thinks that she is not wanting to go to bathroom due to severe pain with walking/standing. She has had 2 episodes of full fecal incontinence. Sometimes can go on toilet. Denies loss of sensation in the inguinal area. Has no fevers or chills. Denies new fall. Using hydrocodone for pain. Overall it is improving slightly with the pain. Has tried hydrocodone  Observations/Objective: Appearance: normal, breathing appears normal, casual grooming  Assessment and Plan: See problem oriented charting  Follow Up Instructions: MRI lumbar spine to rule out spinal impingement, offered ER but daughter wants to avoid given covid-19 pandemic.   I discussed the assessment and treatment plan with the patient. The patient was provided an opportunity to ask questions and all were answered. The patient agreed with the plan and demonstrated an  understanding of the instructions.   The patient was advised to call back or seek an in-person evaluation if the symptoms worsen or if the condition fails to improve as anticipated.  Hoyt Koch, MD

## 2019-05-07 ENCOUNTER — Ambulatory Visit
Admission: RE | Admit: 2019-05-07 | Discharge: 2019-05-07 | Disposition: A | Discharge status: Home / Self Care | Payer: Medicare Other | Source: Ambulatory Visit | Attending: Internal Medicine | Admitting: Internal Medicine

## 2019-05-07 DIAGNOSIS — S3210XA Unspecified fracture of sacrum, initial encounter for closed fracture: Secondary | ICD-10-CM

## 2019-05-07 DIAGNOSIS — M48061 Spinal stenosis, lumbar region without neurogenic claudication: Secondary | ICD-10-CM | POA: Diagnosis not present

## 2019-05-07 DIAGNOSIS — R159 Full incontinence of feces: Secondary | ICD-10-CM

## 2019-05-10 ENCOUNTER — Other Ambulatory Visit: Payer: Self-pay | Admitting: Internal Medicine

## 2019-05-13 MED ORDER — HYDROCODONE-ACETAMINOPHEN 5-325 MG PO TABS: 1.0000 | ORAL_TABLET | Freq: Four times a day (QID) | ORAL | 0 refills | Status: DC | PRN

## 2019-05-13 NOTE — Telephone Encounter (Signed)
Pt daughter calling about refill request.  Pt has 1 pil left

## 2019-05-13 NOTE — Telephone Encounter (Signed)
Control database checked last refill:04/26/2019 30 tabs LOV:05/03/2019 [id number]

## 2019-05-19 ENCOUNTER — Other Ambulatory Visit: Payer: Medicare Other

## 2019-05-22 ENCOUNTER — Other Ambulatory Visit: Payer: Self-pay | Admitting: Internal Medicine

## 2019-05-31 ENCOUNTER — Ambulatory Visit: Payer: Medicare Other | Admitting: Podiatry

## 2019-07-31 ENCOUNTER — Telehealth: Payer: Self-pay | Admitting: Internal Medicine

## 2019-07-31 NOTE — Telephone Encounter (Signed)
    [employer] calling to advise they have faxed a form to office in regards to a knee brace patient is requesting  Phone [phone]

## 2019-08-16 ENCOUNTER — Other Ambulatory Visit: Payer: Self-pay

## 2019-08-16 ENCOUNTER — Ambulatory Visit (INDEPENDENT_AMBULATORY_CARE_PROVIDER_SITE_OTHER): Payer: Medicare Other | Admitting: Podiatry

## 2019-08-16 ENCOUNTER — Encounter: Payer: Self-pay | Admitting: Podiatry

## 2019-08-16 DIAGNOSIS — M79675 Pain in left toe(s): Secondary | ICD-10-CM | POA: Diagnosis not present

## 2019-08-16 DIAGNOSIS — M79674 Pain in right toe(s): Secondary | ICD-10-CM | POA: Diagnosis not present

## 2019-08-16 DIAGNOSIS — B351 Tinea unguium: Secondary | ICD-10-CM | POA: Diagnosis not present

## 2019-08-16 NOTE — Patient Instructions (Signed)

## 2019-08-19 IMAGING — DX DG CHEST 2V
2 series · 2 of 2 positions shown · non-contrast
Comparison: Chest x-ray of June 08, 2016

CLINICAL DATA: Two weeks of dyspnea and wheezing. History of COPD.
Former smoker.

EXAM:
CHEST  2 VIEW

[chest pa]
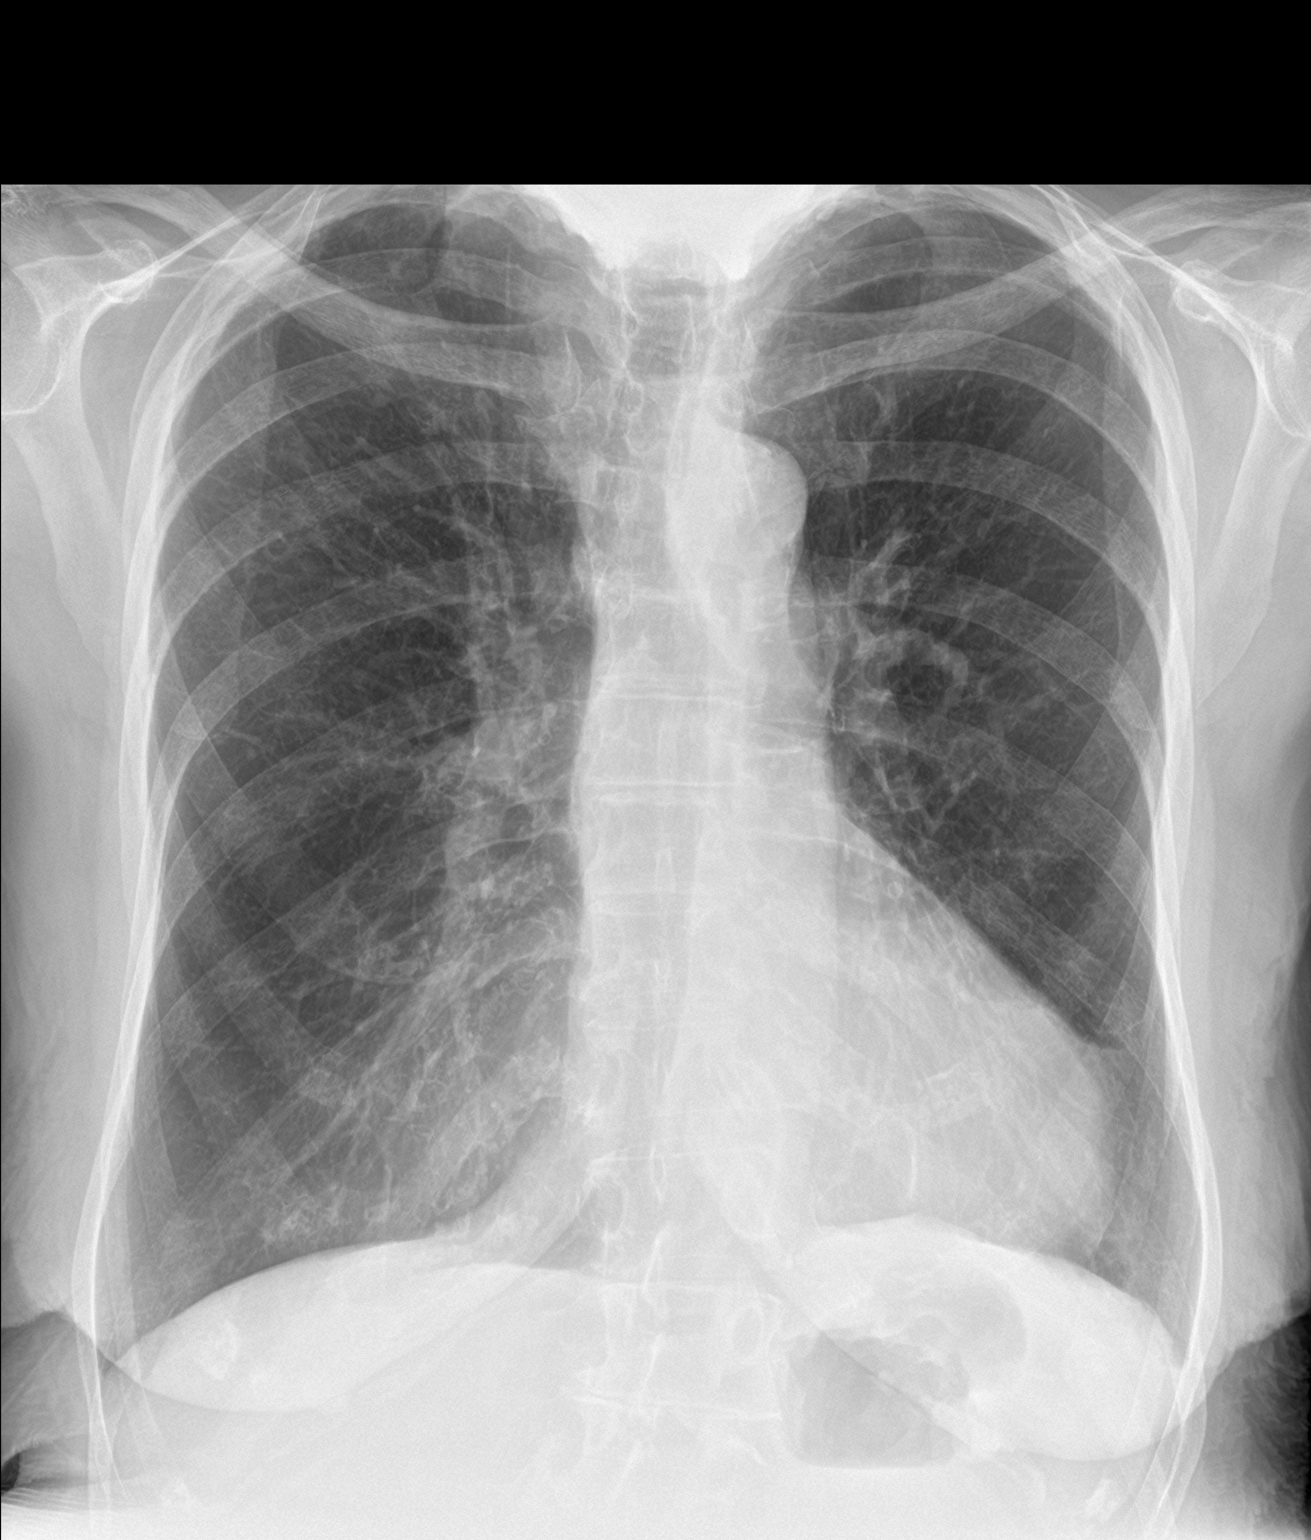

[chest lat]
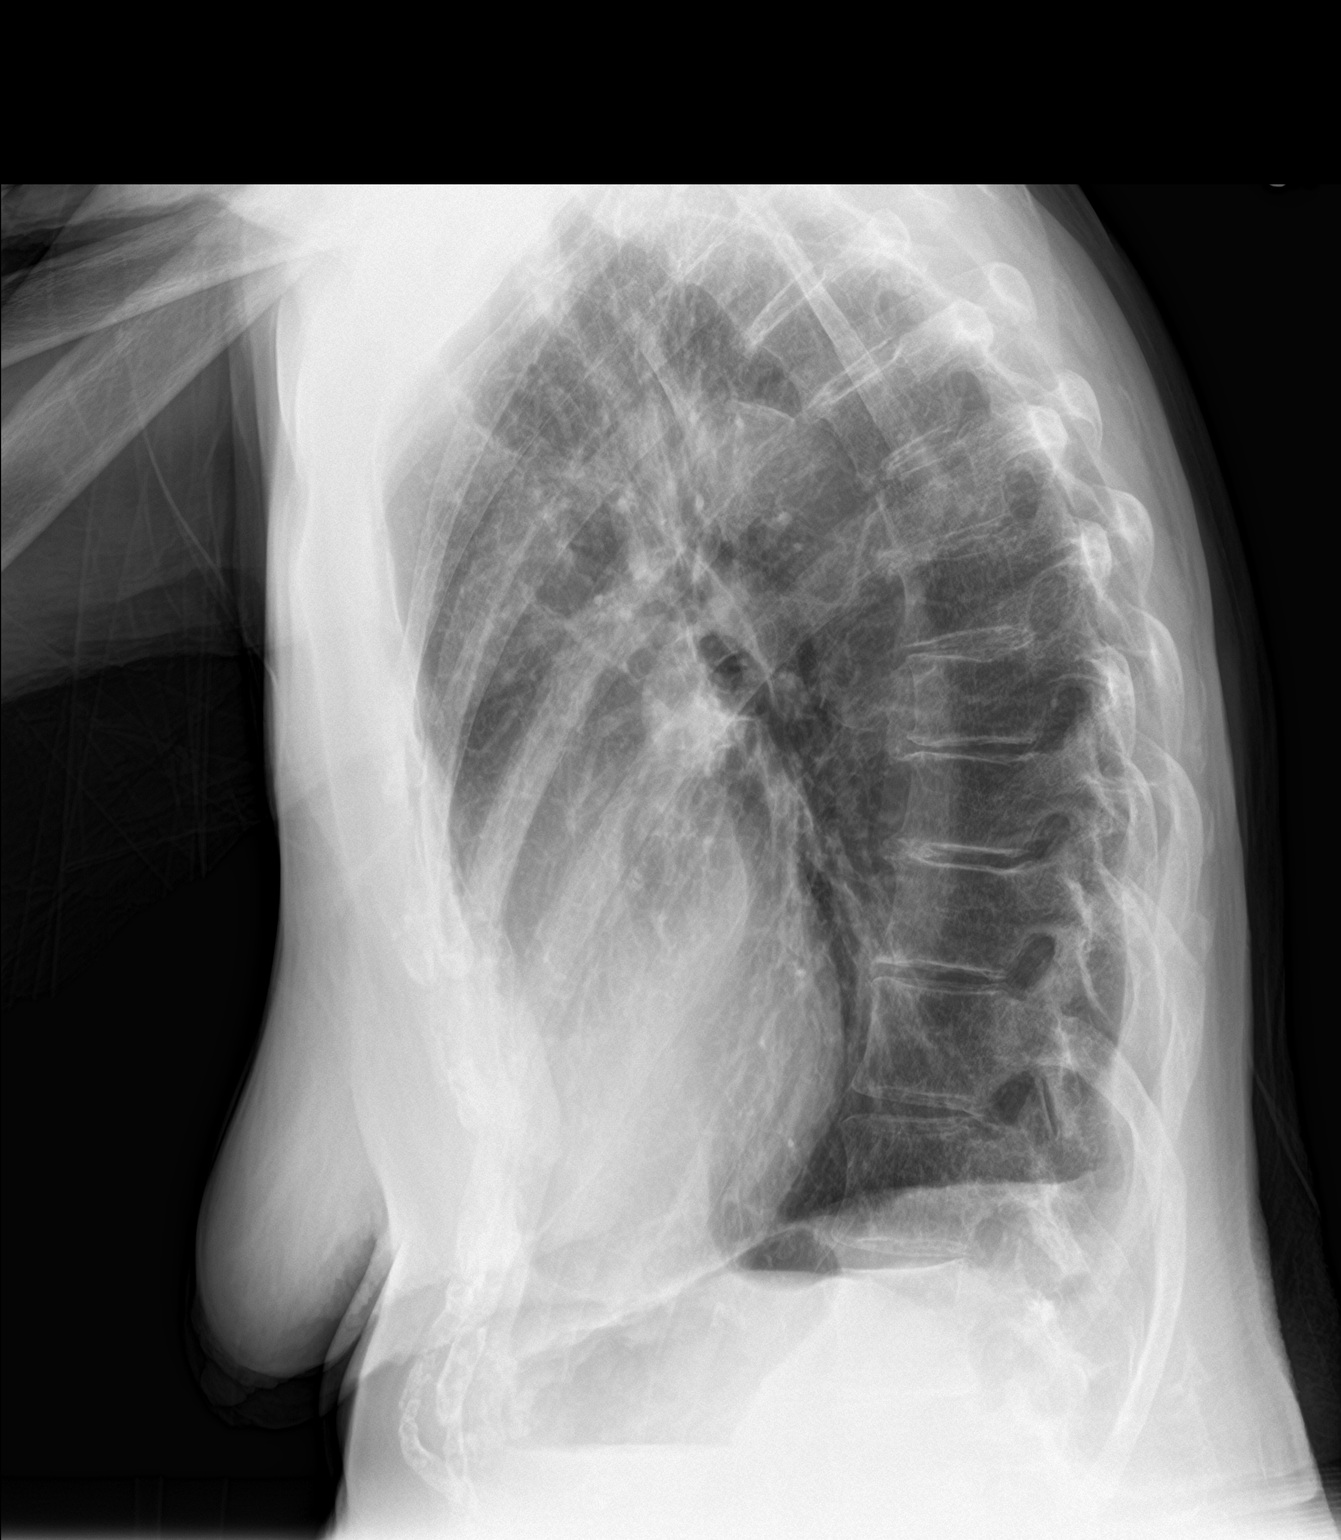

[2 of 2 positions shown; findings below may reference images not displayed]

FINDINGS: The lungs are mildly hyperinflated with hemidiaphragm flattening.
There is no focal infiltrate. There is no pleural effusion. The
heart and pulmonary vascularity are normal. There is calcification
in the wall of the aortic arch. The bony thorax exhibits no acute
abnormality.
IMPRESSION: COPD. No pneumonia, CHF, nor other acute cardiopulmonary
abnormality.

Thoracic aortic atherosclerosis.

## 2019-08-22 NOTE — Progress Notes (Signed)
Subjective: [name] presents today for follow up of painful mycotic nails b/l that are difficult to trim. Pain interferes with ambulation. Aggravating factors include wearing enclosed shoe gear. Pain is relieved with periodic professional debridement.   Allergies  Allergen Reactions  . Prednisone     Patient became jittery all over  . Codeine      Objective: There were no vitals filed for this visit.  Pt 86 y.o. year old female  in NAD. AAO x 3.   Vascular Examination:  Capillary refill time to digits immediate b/l. Faintly palpable DP pulses b/l. Nonpalpable PT pulses b/l. Pedal hair absent b/l Skin temperature gradient within normal limits b/l. Varicosities present b/l.  Dermatological Examination: Pedal skin with normal turgor, texture and tone bilaterally. No open wounds bilaterally. No interdigital macerations bilaterally. Toenails 1-5 b/l elongated, dystrophic, thickened, crumbly with subungual debris and tenderness to dorsal palpation.  Musculoskeletal: Normal muscle strength 5/5 to all lower extremity muscle groups bilaterally, no gross bony deformities bilaterally and no pain crepitus or joint limitation noted with ROM b/l  Neurological: Protective sensation intact 5/5 intact bilaterally with 10g monofilament b/l Vibratory sensation intact b/l  Assessment: 1. Pain due to onychomycosis of toenails of both feet    Plan: -Toenails 1-5 b/l were debrided in length and girth with sterile nail nippers and dremel without iatrogenic bleeding.  -Patient to continue soft, supportive shoe gear daily. -Patient to report any pedal injuries to medical professional immediately. -[name] to call should there be question/concern in the interim.  Return in about 3 months (around 11/16/2019) for nail trim.

## 2019-09-11 ENCOUNTER — Encounter: Payer: Self-pay | Admitting: Internal Medicine

## 2019-09-11 ENCOUNTER — Other Ambulatory Visit: Payer: Self-pay | Admitting: Internal Medicine

## 2019-09-12 ENCOUNTER — Other Ambulatory Visit: Payer: Self-pay | Admitting: Internal Medicine

## 2019-10-04 ENCOUNTER — Telehealth: Payer: Self-pay | Admitting: Internal Medicine

## 2019-10-04 NOTE — Telephone Encounter (Signed)
[name]  @[phone] phone busy. Needed another copy didn't receive

## 2019-10-04 NOTE — Telephone Encounter (Signed)
New message:   [name] is calling from [employer] and they sent a fax over for a back and knee brace for the pt. Please advise.

## 2019-10-08 ENCOUNTER — Encounter: Payer: Self-pay | Admitting: Internal Medicine

## 2019-10-09 MED ORDER — BREO ELLIPTA 100-25 MCG/INH IN AEPB: 1.0000 | INHALATION_SPRAY | Freq: Two times a day (BID) | RESPIRATORY_TRACT | 0 refills | Status: DC

## 2019-10-09 MED ORDER — AMLODIPINE BESYLATE 10 MG PO TABS: 10.0000 mg | ORAL_TABLET | Freq: Every day | ORAL | 0 refills | Status: DC

## 2019-10-28 ENCOUNTER — Other Ambulatory Visit: Payer: Self-pay

## 2019-10-28 ENCOUNTER — Ambulatory Visit (INDEPENDENT_AMBULATORY_CARE_PROVIDER_SITE_OTHER): Payer: Medicare Other | Admitting: Internal Medicine

## 2019-10-28 ENCOUNTER — Encounter: Payer: Self-pay | Admitting: Internal Medicine

## 2019-10-28 VITALS — BP 160/82 | HR 54 | Temp 98.6°F | Ht 64.0 in | Wt 128.4 lb

## 2019-10-28 DIAGNOSIS — Z0000 Encounter for general adult medical examination without abnormal findings: Secondary | ICD-10-CM | POA: Diagnosis not present

## 2019-10-28 DIAGNOSIS — G8929 Other chronic pain: Secondary | ICD-10-CM | POA: Diagnosis not present

## 2019-10-28 DIAGNOSIS — M545 Low back pain: Secondary | ICD-10-CM

## 2019-10-28 DIAGNOSIS — Z7189 Other specified counseling: Secondary | ICD-10-CM | POA: Diagnosis not present

## 2019-10-28 DIAGNOSIS — J449 Chronic obstructive pulmonary disease, unspecified: Secondary | ICD-10-CM | POA: Diagnosis not present

## 2019-10-28 DIAGNOSIS — I10 Essential (primary) hypertension: Secondary | ICD-10-CM | POA: Diagnosis not present

## 2019-10-28 LAB — CBC
HCT: 40.7 % (ref 36.0–46.0)
Hemoglobin: 13.8 g/dL (ref 12.0–15.0)
MCHC: 33.9 g/dL (ref 30.0–36.0)
MCV: 92.8 fl (ref 78.0–100.0)
Platelets: 316 10*3/uL (ref 150.0–400.0)
RBC: 4.39 Mil/uL (ref 3.87–5.11)
RDW: 14.8 % (ref 11.5–15.5)
WBC: 9.2 10*3/uL (ref 4.0–10.5)

## 2019-10-28 LAB — LIPID PANEL
Cholesterol: 185 mg/dL (ref 0–200)
HDL: 52.4 mg/dL (ref >39.00)
LDL Cholesterol: 105 mg/dL — ABNORMAL HIGH (ref 0–99)
NonHDL: 133.06
Total CHOL/HDL Ratio: 4
Triglycerides: 140 mg/dL (ref 0.0–149.0)
VLDL: 28 mg/dL (ref 0.0–40.0)

## 2019-10-28 LAB — COMPREHENSIVE METABOLIC PANEL
ALT: 10 U/L (ref 0–35)
AST: 21 U/L (ref 0–37)
Albumin: 3.9 g/dL (ref 3.5–5.2)
Alkaline Phosphatase: 82 U/L (ref 39–117)
BUN: 25 mg/dL — ABNORMAL HIGH (ref 6–23)
CO2: 31 mEq/L (ref 19–32)
Calcium: 10.1 mg/dL (ref 8.4–10.5)
Chloride: 105 mEq/L (ref 96–112)
Creatinine, Ser: 1.18 mg/dL (ref 0.40–1.20)
GFR: 42.96 mL/min — ABNORMAL LOW (ref >60.00)
Glucose, Bld: 105 mg/dL — ABNORMAL HIGH (ref 70–99)
Potassium: 4.4 mEq/L (ref 3.5–5.1)
Sodium: 141 mEq/L (ref 135–145)
Total Bilirubin: 0.5 mg/dL (ref 0.2–1.2)
Total Protein: 6.9 g/dL (ref 6.0–8.3)

## 2019-10-28 MED ORDER — PROAIR HFA 108 (90 BASE) MCG/ACT IN AERS: INHALATION_SPRAY | RESPIRATORY_TRACT | 11 refills | Status: AC

## 2019-10-28 MED ORDER — BREO ELLIPTA 100-25 MCG/INH IN AEPB: 1.0000 | INHALATION_SPRAY | Freq: Two times a day (BID) | RESPIRATORY_TRACT | 11 refills | Status: AC

## 2019-10-28 MED ORDER — AMLODIPINE BESYLATE 10 MG PO TABS: 10.0000 mg | ORAL_TABLET | Freq: Every day | ORAL | 3 refills | Status: DC

## 2019-10-28 NOTE — Assessment & Plan Note (Signed)
Flu shot due next season. Pneumonia complete. Shingrix counseled. Covid-19 counseled. Tetanus declines further. Colonoscopy aged out. Mammogram aged out, pap smear aged out and dexa declines further. Counseled about sun safety and mole surveillance. Counseled about the dangers of distracted driving. Given 10 year screening recommendations.

## 2019-10-28 NOTE — Assessment & Plan Note (Signed)
Refill breo and albuterol.

## 2019-10-28 NOTE — Patient Instructions (Signed)
Health Maintenance, Female Adopting a healthy lifestyle and getting preventive care are important in promoting health and wellness. Ask your health care provider about:  The right schedule for you to have regular tests and exams.  Things you can do on your own to prevent diseases and keep yourself healthy. What should I know about diet, weight, and exercise? Eat a healthy diet   Eat a diet that includes plenty of vegetables, fruits, low-fat dairy products, and lean protein.  Do not eat a lot of foods that are high in solid fats, added sugars, or sodium. Maintain a healthy weight Body mass index (BMI) is used to identify weight problems. It estimates body fat based on height and weight. Your health care provider can help determine your BMI and help you achieve or maintain a healthy weight. Get regular exercise Get regular exercise. This is one of the most important things you can do for your health. Most adults should:  Exercise for at least 150 minutes each week. The exercise should increase your heart rate and make you sweat (moderate-intensity exercise).  Do strengthening exercises at least twice a week. This is in addition to the moderate-intensity exercise.  Spend less time sitting. Even light physical activity can be beneficial. Watch cholesterol and blood lipids Have your blood tested for lipids and cholesterol at 84 years of age, then have this test every 5 years. Have your cholesterol levels checked more often if:  Your lipid or cholesterol levels are high.  You are older than 84 years of age.  You are at high risk for heart disease. What should I know about cancer screening? Depending on your health history and family history, you may need to have cancer screening at various ages. This may include screening for:  Breast cancer.  Cervical cancer.  Colorectal cancer.  Skin cancer.  Lung cancer. What should I know about heart disease, diabetes, and high blood  pressure? Blood pressure and heart disease  High blood pressure causes heart disease and increases the risk of stroke. This is more likely to develop in people who have high blood pressure readings, are of African descent, or are overweight.  Have your blood pressure checked: ? Every 3-5 years if you are 18-39 years of age. ? Every year if you are 40 years old or older. Diabetes Have regular diabetes screenings. This checks your fasting blood sugar level. Have the screening done:  Once every three years after age 40 if you are at a normal weight and have a low risk for diabetes.  More often and at a younger age if you are overweight or have a high risk for diabetes. What should I know about preventing infection? Hepatitis B If you have a higher risk for hepatitis B, you should be screened for this virus. Talk with your health care provider to find out if you are at risk for hepatitis B infection. Hepatitis C Testing is recommended for:  Everyone born from 1945 through 1965.  Anyone with known risk factors for hepatitis C. Sexually transmitted infections (STIs)  Get screened for STIs, including gonorrhea and chlamydia, if: ? You are sexually active and are younger than 84 years of age. ? You are older than 84 years of age and your health care provider tells you that you are at risk for this type of infection. ? Your sexual activity has changed since you were last screened, and you are at increased risk for chlamydia or gonorrhea. Ask your health care provider if   you are at risk.  Ask your health care provider about whether you are at high risk for HIV. Your health care provider may recommend a prescription medicine to help prevent HIV infection. If you choose to take medicine to prevent HIV, you should first get tested for HIV. You should then be tested every 3 months for as long as you are taking the medicine. Pregnancy  If you are about to stop having your period (premenopausal) and  you may become pregnant, seek counseling before you get pregnant.  Take 400 to 800 micrograms (mcg) of folic acid every day if you become pregnant.  Ask for birth control (contraception) if you want to prevent pregnancy. Osteoporosis and menopause Osteoporosis is a disease in which the bones lose minerals and strength with aging. This can result in bone fractures. If you are 65 years old or older, or if you are at risk for osteoporosis and fractures, ask your health care provider if you should:  Be screened for bone loss.  Take a calcium or vitamin D supplement to lower your risk of fractures.  Be given hormone replacement therapy (HRT) to treat symptoms of menopause. Follow these instructions at home: Lifestyle  Do not use any products that contain nicotine or tobacco, such as cigarettes, e-cigarettes, and chewing tobacco. If you need help quitting, ask your health care provider.  Do not use street drugs.  Do not share needles.  Ask your health care provider for help if you need support or information about quitting drugs. Alcohol use  Do not drink alcohol if: ? Your health care provider tells you not to drink. ? You are pregnant, may be pregnant, or are planning to become pregnant.  If you drink alcohol: ? Limit how much you use to 0-1 drink a day. ? Limit intake if you are breastfeeding.  Be aware of how much alcohol is in your drink. In the [place] one drink equals one 12 oz bottle of beer (355 mL), one 5 oz glass of wine (148 mL), or one 1 oz glass of hard liquor (44 mL). General instructions  Schedule regular health, dental, and eye exams.  Stay current with your vaccines.  Tell your health care provider if: ? You often feel depressed. ? You have ever been abused or do not feel safe at home. Summary  Adopting a healthy lifestyle and getting preventive care are important in promoting health and wellness.  Follow your health care provider's instructions about healthy  diet, exercising, and getting tested or screened for diseases.  Follow your health care provider's instructions on monitoring your cholesterol and blood pressure. This information is not intended to replace advice given to you by your health care provider. Make sure you discuss any questions you have with your health care provider. Document Revised: 05/23/2018 Document Reviewed: 05/23/2018 Elsevier Patient Education  2020 Elsevier Inc.  

## 2019-10-28 NOTE — Progress Notes (Signed)
Subjective:   Patient ID: [name], female    DOB: October 09, 1926, 86 y.o.   MRN: [id number]  HPI Here for medicare wellness, no new complaints. Please see A/P for status and treatment of chronic medical problems.   HPI #2: Here for follow up blood pressure (taking amlodipine daily, denies chest pains or headaches, denies missing doses) and COPD (some worsening SOB since fall and immobility last Nov, does take breo in the morning and albuterol as needed, denies cough or sputum production) and sacral fractures/renal cell carcinoma (found after fall November 2020, has recovered well, denies back pain except after standing, no urine or bowel incontinence, they do not elect further workup of this).   Diet: heart healthy Physical activity: sedentary Depression/mood screen: negative Hearing: moderate loss bilaterally declines formal audiology evaluation Visual acuity: grossly normal, performs annual eye exam  ADLs: capable with assist Fall risk: low Home safety: good Cognitive evaluation: intact to orientation, naming, recall and repetition EOL planning: adv directives discussed    Office Visit from 04/11/2019 in [facility]  PHQ-2 Total Score  1      I have personally reviewed and have noted 1. The patient's medical and social history - reviewed today no changes 2. Their use of alcohol, tobacco or illicit drugs 3. Their current medications and supplements 4. The patient's functional ability including ADL's, fall risks, home safety risks and hearing or visual impairment. 5. Diet and physical activities 6. Evidence for depression or mood disorders 7. Care team reviewed and updated  Patient Care Team: Hoyt Koch, MD as PCP - General (Internal Medicine) Past Medical History:  Diagnosis Date  . ALLERGIC RHINITIS 05/29/2007  . CHRONIC OBSTRUCTIVE PULMONARY DISEASE, ACUTE EXACERBATION 11/21/2007  . COLONIC POLYPS, HX OF 11/21/2007  . COPD 11/21/2007   . DIVERTICULOSIS, COLON 11/21/2007  . HIP PAIN, LEFT, CHRONIC 06/01/2010  . HYPERLIPIDEMIA 05/29/2007  . HYPERTENSION 05/29/2007  . Hypopotassemia 06/12/2007  . HYSTERECTOMY, HX OF 05/29/2007  . Other acquired absence of organ 05/29/2007  . Other postprocedural status(V45.89) 05/29/2007  . RADICULOPATHY 05/29/2007  . REACTIVE AIRWAY DISEASE 05/29/2007   Past Surgical History:  Procedure Laterality Date  . ABDOMINAL HYSTERECTOMY    . APPENDECTOMY    . CATARACT EXTRACTION Bilateral 2016   Dr. Katy Fitch  . CATARACT EXTRACTION W/ INTRAOCULAR LENS IMPLANT Bilateral 05/2015   around december 9th and 28   . DILATION AND CURETTAGE OF UTERUS    . LIPOMA EXCISION    . LUMBAR LAMINECTOMY    . PARTIAL HIP ARTHROPLASTY Left 2012  . TONSILLECTOMY     Family History  Problem Relation Age of Onset  . Arthritis Mother   . Heart disease Mother   . Uterine cancer Daughter   . Cancer Daughter        ovarian  . Heart disease Father   . Diabetes Neg Hx   . COPD Neg Hx   . Stroke Neg Hx   . Kidney disease Neg Hx    Review of Systems  Constitutional: Negative.   HENT: Negative.   Eyes: Negative.   Respiratory: Negative for cough, chest tightness and shortness of breath.   Cardiovascular: Negative for chest pain, palpitations and leg swelling.  Gastrointestinal: Negative for abdominal distention, abdominal pain, constipation, diarrhea, nausea and vomiting.  Musculoskeletal: Positive for back pain.  Skin: Negative.   Neurological: Negative.   Psychiatric/Behavioral: Negative.     Objective:  Physical Exam Constitutional:      Appearance:  She is well-developed.  HENT:     Head: Normocephalic and atraumatic.  Cardiovascular:     Rate and Rhythm: Normal rate and regular rhythm.  Pulmonary:     Effort: Pulmonary effort is normal. No respiratory distress.     Breath sounds: Normal breath sounds. No wheezing or rales.  Abdominal:     General: Bowel sounds are normal. There is no  distension.     Palpations: Abdomen is soft.     Tenderness: There is no abdominal tenderness. There is no rebound.  Musculoskeletal:     Cervical back: Normal range of motion.  Skin:    General: Skin is warm and dry.  Neurological:     Mental Status: She is alert and oriented to person, place, and time.     Coordination: Coordination abnormal.     Comments: Wheelchair for long distances, cane for short distances     Vitals:   10/28/19 1503  BP: (!) 160/82  Pulse: (!) 54  Temp: 98.6 F (37 C)  SpO2: 98%  Weight: 128 lb 6.4 oz (58.2 kg)  Height: 5\' 4"  (1.626 m)   This visit occurred during the SARS-CoV-2 public health emergency.  Safety protocols were in place, including screening questions prior to the visit, additional usage of staff PPE, and extensive cleaning of exam room while observing appropriate contact time as indicated for disinfecting solutions.   Assessment & Plan:

## 2019-10-28 NOTE — Assessment & Plan Note (Signed)
Mostly with standing and using cane for short distances wheelchair for long distances

## 2019-10-28 NOTE — Assessment & Plan Note (Signed)
BP close to goal on amlodipine 10 mg daily. Checking CMP and adjust as needed.

## 2019-10-28 NOTE — Assessment & Plan Note (Signed)
Elects no further workup of renal cell carcinoma. This is appropriate and consistent with her goals of care.

## 2019-12-30 ENCOUNTER — Encounter: Payer: Self-pay | Admitting: Internal Medicine

## 2020-03-02 IMAGING — DX DG LUMBAR SPINE COMPLETE 4+V
5 series · 5 of 5 positions shown · non-contrast
Comparison: Intraoperative lumbar radiographs 06/12/2006. Lumbar
MRI 03/27/2006.

CLINICAL DATA: 89-year-old female with chronic lumbar back pain.

EXAM:
LUMBAR SPINE - COMPLETE 4+ VIEW

[l-spine ap]
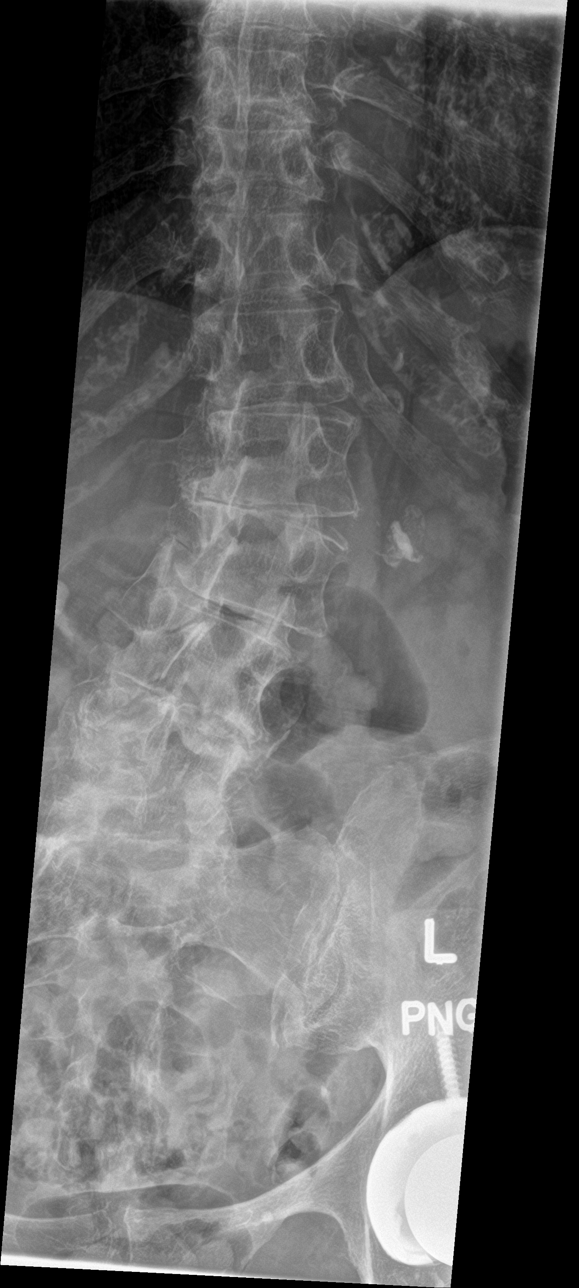

[l-spine obl (1 of 2)]
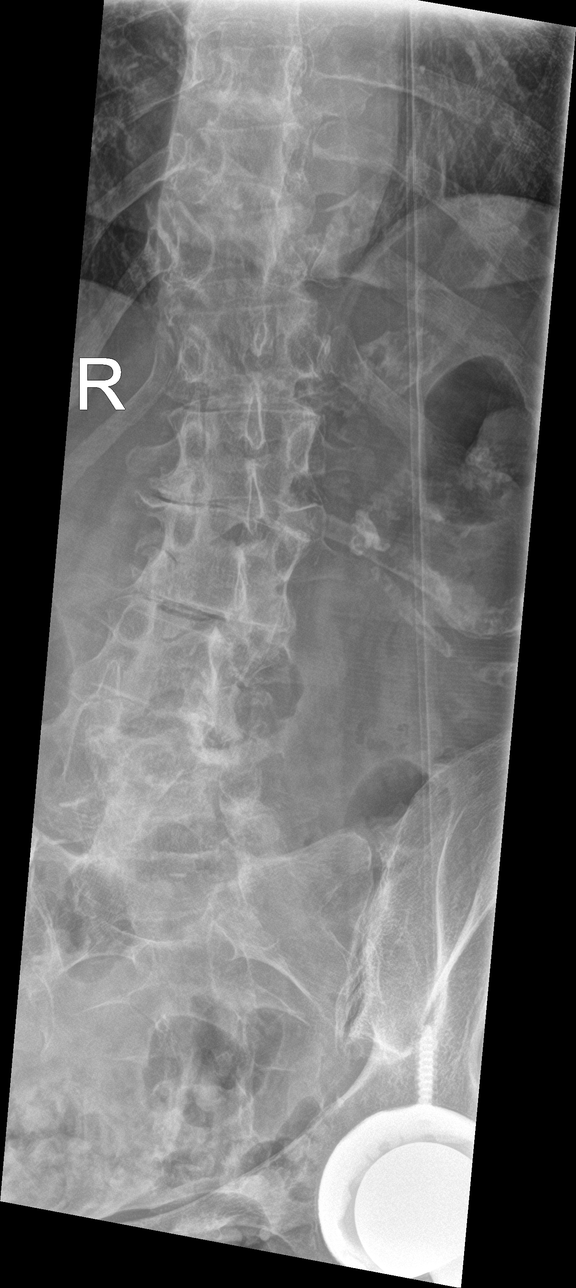

[l-spine obl (2 of 2)]
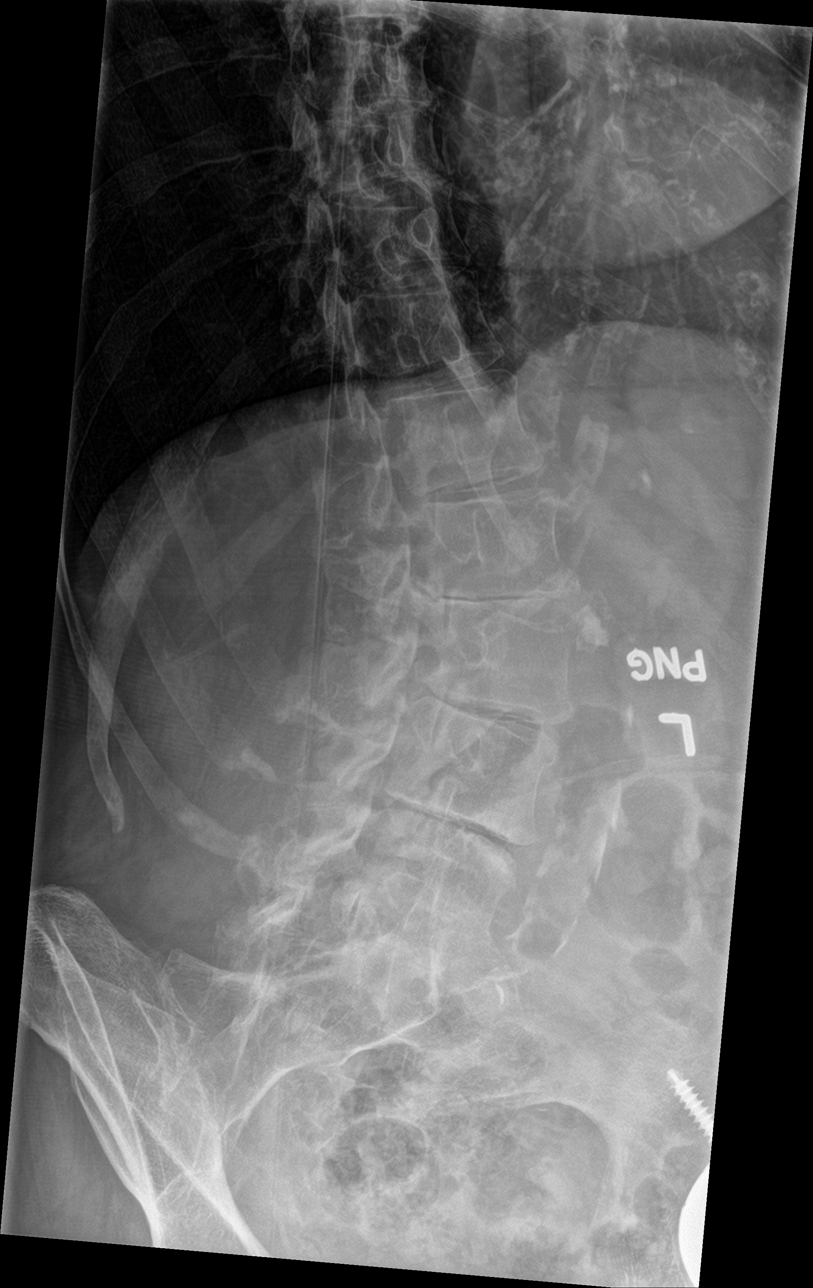

[l-spine lat]
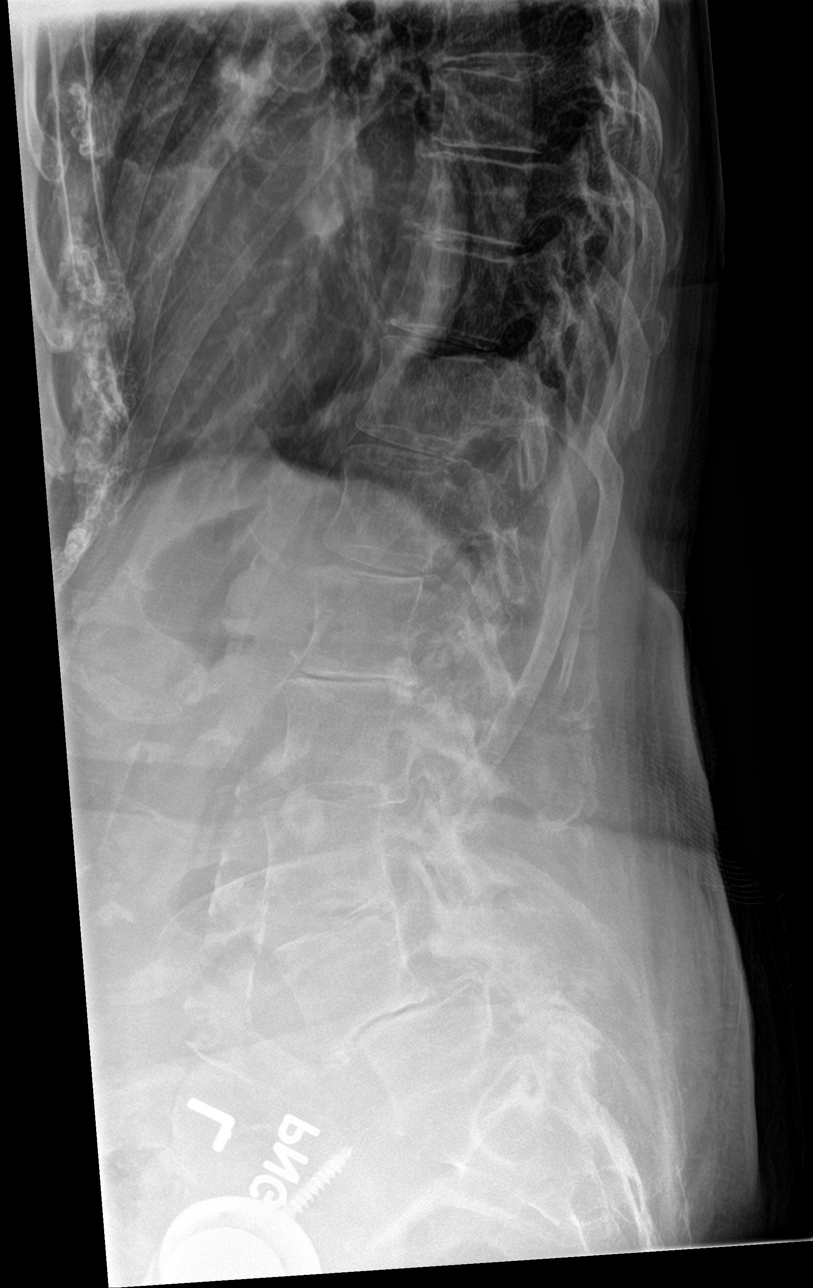

[l-spine spot]
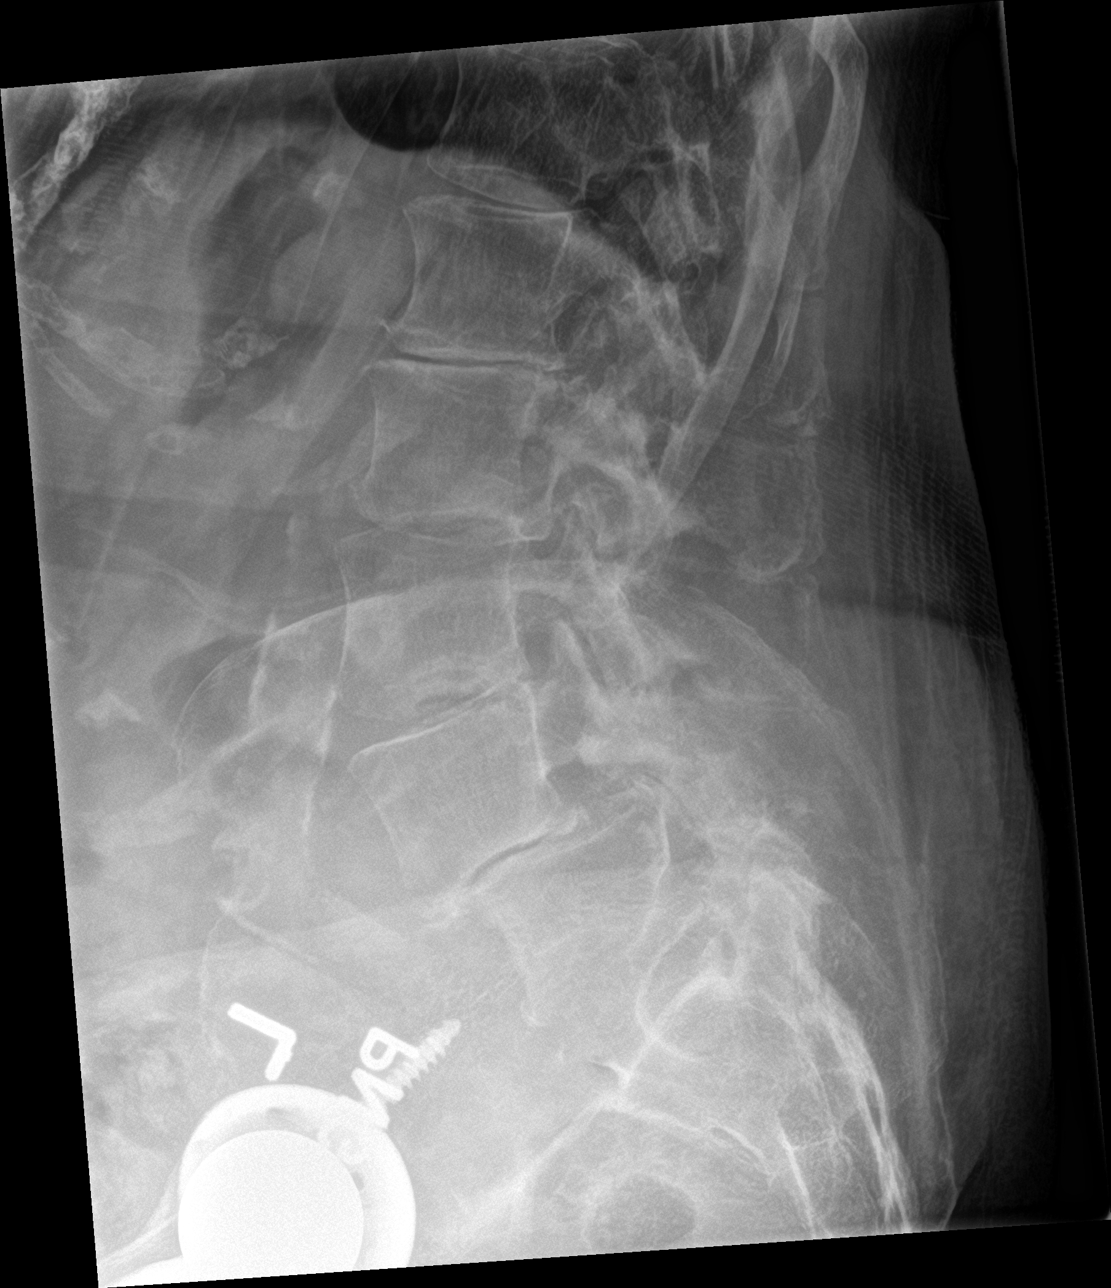

[5 of 5 positions shown; findings below may reference images not displayed]

FINDINGS: Osteopenia. Same numbering system used today as in 2224 designating
chronic interbody fusion at L5-S1 and chronic anterolisthesis of L4
on L5. Moderate levoconvex lumbar spine curvature appears increased
since 2224. The anterolisthesis at L4-L5 has progressed since 2224,
now up to 16 millimeters (grade 2/grade 3). Severe L4-L5 disc space
loss has progressed. Chronic vacuum disc. Retrolisthesis of L2 on L3
has developed since 2224 and measures about 7 millimeters. Chronic
severe disc space loss and vacuum disc at L1-L2 Re demonstrated. No
acute osseous abnormality identified. The visible lower thoracic
levels appear intact. Grossly intact sacrum and SI joints. Left hip
arthroplasty. Aortoiliac calcified atherosclerosis.
IMPRESSION: 1.  No acute osseous abnormality identified.
2. Progressed since 2224 and now severe (grade 2/grade 3)
anterolisthesis of L4 on L5. Retrolisthesis of L2 on L3 has
developed. Levoconvex lumbar scoliosis has progressed.
3. Chronic severe disc degeneration at L1-L2 and L4-L5. Chronic
interbody ankylosis at L5-S1.
4.  Aortic Atherosclerosis ([id number]).

## 2020-07-31 ENCOUNTER — Encounter: Payer: Self-pay | Admitting: Internal Medicine

## 2020-08-03 ENCOUNTER — Other Ambulatory Visit: Payer: Self-pay | Admitting: Family

## 2020-08-03 MED ORDER — SULFAMETHOXAZOLE-TRIMETHOPRIM 800-160 MG PO TABS: 1.0000 | ORAL_TABLET | Freq: Two times a day (BID) | ORAL | 0 refills | Status: DC

## 2020-08-31 ENCOUNTER — Encounter: Payer: Self-pay | Admitting: Internal Medicine

## 2020-09-01 NOTE — Telephone Encounter (Signed)
Dr.Crawford please advise.   I have received forms via fax.

## 2020-09-03 NOTE — Telephone Encounter (Signed)
Dr.Crawford, I have placed forms in your box to review.

## 2020-09-04 NOTE — Telephone Encounter (Signed)
Patient will need a virtual visit before forms can be completed.   Daughter informed via VM and MyChart.

## 2020-09-11 ENCOUNTER — Encounter: Payer: Self-pay | Admitting: Internal Medicine

## 2020-09-11 ENCOUNTER — Telehealth (INDEPENDENT_AMBULATORY_CARE_PROVIDER_SITE_OTHER): Payer: Medicare Other | Admitting: Internal Medicine

## 2020-09-11 DIAGNOSIS — J449 Chronic obstructive pulmonary disease, unspecified: Secondary | ICD-10-CM

## 2020-09-11 DIAGNOSIS — G8929 Other chronic pain: Secondary | ICD-10-CM

## 2020-09-11 DIAGNOSIS — Z0279 Encounter for issue of other medical certificate: Secondary | ICD-10-CM

## 2020-09-11 DIAGNOSIS — M545 Low back pain, unspecified: Secondary | ICD-10-CM

## 2020-09-11 DIAGNOSIS — I10 Essential (primary) hypertension: Secondary | ICD-10-CM

## 2020-09-11 NOTE — Progress Notes (Signed)
Virtual Visit via Video Note  I connected with [name] on 09/11/20 at 10:40 AM EDT by a video enabled telemedicine application and verified that I am speaking with the correct person using two identifiers.  The patient and the provider were at separate locations throughout the entire encounter. Patient location: home, Provider location: work   I discussed the limitations of evaluation and management by telemedicine and the availability of in person appointments. The patient expressed understanding and agreed to proceed. The patient and the provider were the only parties present for the visit unless noted in HPI below.  History of Present Illness: The patient is a 87 y.o. female with visit for follow up overall. Daughter is needing to do more care with mom recently. She does need FMLA paperwork. She is a Marine scientist and is concerned her mom may have had a TIA recently. Had some fall and then different movement with one side. Went away in a day or so. Does not take daily aspirin. Stable symptoms overall. Back is doing well recently. Appetite is poor.   Observations/Objective: Appearance: appears stated age, breathing appears normal, casual grooming, abdomen does not appear distended, throat not well visualized, memory not assessed, mental status is awake and oriented   Assessment and Plan: See problem oriented charting  Follow Up Instructions: add aspirin 81 mg daily to prevent stroke, FMLA okay to 6 days per month  I discussed the assessment and treatment plan with the patient. The patient was provided an opportunity to ask questions and all were answered. The patient agreed with the plan and demonstrated an understanding of the instructions.   The patient was advised to call back or seek an in-person evaluation if the symptoms worsen or if the condition fails to improve as anticipated.  Hoyt Koch, MD

## 2020-09-11 NOTE — Assessment & Plan Note (Addendum)
BP at goal on amlodipine. Adding aspirin 81 mg daily to help with stroke prevention.

## 2020-09-11 NOTE — Telephone Encounter (Signed)
Forms have been completed&Signed by provider, Faxed to [facility] [phone], Copy sent to scan &Charged for.   Original mailed Rosann Auerbach for her records.

## 2020-09-11 NOTE — Assessment & Plan Note (Signed)
Doing well with tylenol.

## 2020-09-11 NOTE — Assessment & Plan Note (Signed)
Stable overall.  

## 2020-09-17 ENCOUNTER — Encounter: Payer: Self-pay | Admitting: Internal Medicine

## 2020-09-21 MED ORDER — NITROFURANTOIN MONOHYD MACRO 100 MG PO CAPS: 100.0000 mg | ORAL_CAPSULE | Freq: Two times a day (BID) | ORAL | 0 refills | Status: DC

## 2020-10-05 ENCOUNTER — Encounter: Payer: Self-pay | Admitting: Internal Medicine

## 2020-10-05 DIAGNOSIS — R300 Dysuria: Secondary | ICD-10-CM

## 2020-10-12 ENCOUNTER — Other Ambulatory Visit: Payer: Self-pay

## 2020-10-13 ENCOUNTER — Other Ambulatory Visit: Payer: Self-pay

## 2020-10-13 ENCOUNTER — Encounter: Payer: Self-pay | Admitting: Internal Medicine

## 2020-10-13 ENCOUNTER — Emergency Department (HOSPITAL_COMMUNITY): Payer: Medicare Other

## 2020-10-13 ENCOUNTER — Ambulatory Visit (INDEPENDENT_AMBULATORY_CARE_PROVIDER_SITE_OTHER): Payer: Medicare Other | Admitting: Internal Medicine

## 2020-10-13 ENCOUNTER — Encounter (HOSPITAL_COMMUNITY): Payer: Self-pay

## 2020-10-13 ENCOUNTER — Telehealth: Payer: Self-pay | Admitting: *Deleted

## 2020-10-13 ENCOUNTER — Inpatient Hospital Stay (HOSPITAL_COMMUNITY)
Admission: EM | Admit: 2020-10-13 | Discharge: 2020-10-16 | DRG: 682 | Disposition: A | Discharge status: Skilled Nursing Facility | Payer: Medicare Other | Attending: Internal Medicine | Admitting: Internal Medicine

## 2020-10-13 VITALS — BP 116/60 | HR 95 | Temp 98.2°F | Resp 18 | Ht 64.0 in | Wt 104.6 lb

## 2020-10-13 DIAGNOSIS — R3915 Urgency of urination: Secondary | ICD-10-CM | POA: Diagnosis not present

## 2020-10-13 DIAGNOSIS — Z23 Encounter for immunization: Secondary | ICD-10-CM | POA: Diagnosis not present

## 2020-10-13 DIAGNOSIS — R300 Dysuria: Secondary | ICD-10-CM

## 2020-10-13 DIAGNOSIS — K5730 Diverticulosis of large intestine without perforation or abscess without bleeding: Secondary | ICD-10-CM | POA: Diagnosis present

## 2020-10-13 DIAGNOSIS — Z66 Do not resuscitate: Secondary | ICD-10-CM | POA: Diagnosis present

## 2020-10-13 DIAGNOSIS — J449 Chronic obstructive pulmonary disease, unspecified: Secondary | ICD-10-CM | POA: Diagnosis not present

## 2020-10-13 DIAGNOSIS — Z8249 Family history of ischemic heart disease and other diseases of the circulatory system: Secondary | ICD-10-CM

## 2020-10-13 DIAGNOSIS — N10 Acute tubulo-interstitial nephritis: Secondary | ICD-10-CM

## 2020-10-13 DIAGNOSIS — N136 Pyonephrosis: Secondary | ICD-10-CM | POA: Diagnosis present

## 2020-10-13 DIAGNOSIS — N179 Acute kidney failure, unspecified: Secondary | ICD-10-CM | POA: Diagnosis not present

## 2020-10-13 DIAGNOSIS — J310 Chronic rhinitis: Secondary | ICD-10-CM | POA: Diagnosis present

## 2020-10-13 DIAGNOSIS — E785 Hyperlipidemia, unspecified: Secondary | ICD-10-CM | POA: Diagnosis present

## 2020-10-13 DIAGNOSIS — Z515 Encounter for palliative care: Secondary | ICD-10-CM

## 2020-10-13 DIAGNOSIS — E875 Hyperkalemia: Secondary | ICD-10-CM | POA: Diagnosis present

## 2020-10-13 DIAGNOSIS — K5790 Diverticulosis of intestine, part unspecified, without perforation or abscess without bleeding: Secondary | ICD-10-CM | POA: Diagnosis not present

## 2020-10-13 DIAGNOSIS — I10 Essential (primary) hypertension: Secondary | ICD-10-CM | POA: Diagnosis not present

## 2020-10-13 DIAGNOSIS — S90421A Blister (nonthermal), right great toe, initial encounter: Secondary | ICD-10-CM | POA: Diagnosis present

## 2020-10-13 DIAGNOSIS — E43 Unspecified severe protein-calorie malnutrition: Secondary | ICD-10-CM | POA: Diagnosis present

## 2020-10-13 DIAGNOSIS — F0390 Unspecified dementia without behavioral disturbance: Secondary | ICD-10-CM | POA: Diagnosis present

## 2020-10-13 DIAGNOSIS — Z8261 Family history of arthritis: Secondary | ICD-10-CM

## 2020-10-13 DIAGNOSIS — Z20822 Contact with and (suspected) exposure to covid-19: Secondary | ICD-10-CM | POA: Diagnosis not present

## 2020-10-13 DIAGNOSIS — Z79899 Other long term (current) drug therapy: Secondary | ICD-10-CM

## 2020-10-13 DIAGNOSIS — Z87440 Personal history of urinary (tract) infections: Secondary | ICD-10-CM

## 2020-10-13 DIAGNOSIS — Z9842 Cataract extraction status, left eye: Secondary | ICD-10-CM

## 2020-10-13 DIAGNOSIS — R350 Frequency of micturition: Secondary | ICD-10-CM | POA: Diagnosis not present

## 2020-10-13 DIAGNOSIS — Z8049 Family history of malignant neoplasm of other genital organs: Secondary | ICD-10-CM

## 2020-10-13 DIAGNOSIS — E876 Hypokalemia: Secondary | ICD-10-CM | POA: Diagnosis present

## 2020-10-13 DIAGNOSIS — Z7189 Other specified counseling: Secondary | ICD-10-CM

## 2020-10-13 DIAGNOSIS — N3090 Cystitis, unspecified without hematuria: Secondary | ICD-10-CM | POA: Diagnosis not present

## 2020-10-13 DIAGNOSIS — Z87891 Personal history of nicotine dependence: Secondary | ICD-10-CM

## 2020-10-13 DIAGNOSIS — Z885 Allergy status to narcotic agent status: Secondary | ICD-10-CM

## 2020-10-13 DIAGNOSIS — Z90710 Acquired absence of both cervix and uterus: Secondary | ICD-10-CM

## 2020-10-13 DIAGNOSIS — Z9841 Cataract extraction status, right eye: Secondary | ICD-10-CM

## 2020-10-13 DIAGNOSIS — Z961 Presence of intraocular lens: Secondary | ICD-10-CM | POA: Diagnosis present

## 2020-10-13 DIAGNOSIS — N390 Urinary tract infection, site not specified: Secondary | ICD-10-CM

## 2020-10-13 DIAGNOSIS — Z791 Long term (current) use of non-steroidal anti-inflammatories (NSAID): Secondary | ICD-10-CM

## 2020-10-13 DIAGNOSIS — Z8719 Personal history of other diseases of the digestive system: Secondary | ICD-10-CM

## 2020-10-13 DIAGNOSIS — M5410 Radiculopathy, site unspecified: Secondary | ICD-10-CM | POA: Diagnosis present

## 2020-10-13 DIAGNOSIS — R531 Weakness: Secondary | ICD-10-CM | POA: Diagnosis not present

## 2020-10-13 DIAGNOSIS — N2889 Other specified disorders of kidney and ureter: Secondary | ICD-10-CM | POA: Diagnosis present

## 2020-10-13 DIAGNOSIS — N132 Hydronephrosis with renal and ureteral calculous obstruction: Secondary | ICD-10-CM | POA: Diagnosis not present

## 2020-10-13 DIAGNOSIS — N281 Cyst of kidney, acquired: Secondary | ICD-10-CM | POA: Diagnosis not present

## 2020-10-13 DIAGNOSIS — Z888 Allergy status to other drugs, medicaments and biological substances status: Secondary | ICD-10-CM

## 2020-10-13 DIAGNOSIS — Z96642 Presence of left artificial hip joint: Secondary | ICD-10-CM | POA: Diagnosis present

## 2020-10-13 LAB — CBC
HCT: 40.2 % (ref 36.0–46.0)
Hemoglobin: 13.3 g/dL (ref 12.0–15.0)
MCHC: 33.1 g/dL (ref 30.0–36.0)
MCV: 92.9 fl (ref 78.0–100.0)
Platelets: 425 10*3/uL — ABNORMAL HIGH (ref 150.0–400.0)
RBC: 4.32 Mil/uL (ref 3.87–5.11)
RDW: 14.2 % (ref 11.5–15.5)
WBC: 10.5 10*3/uL (ref 4.0–10.5)

## 2020-10-13 LAB — URINALYSIS, ROUTINE W REFLEX MICROSCOPIC
Bilirubin Urine: NEGATIVE
Bilirubin Urine: NEGATIVE
Glucose, UA: NEGATIVE mg/dL
Ketones, ur: NEGATIVE mg/dL
Nitrite: NEGATIVE
Nitrite: NEGATIVE
Protein, ur: 100 mg/dL — AB
Specific Gravity, Urine: 1.02 (ref 1.000–1.030)
Specific Gravity, Urine: 1.013 (ref 1.005–1.030)
Total Protein, Urine: >300 — AB
Urine Glucose: NEGATIVE
Urobilinogen, UA: 0.2 — AB (ref 0.0–1.0)
WBC, UA: >50 WBC/hpf — ABNORMAL HIGH (ref 0–5)
pH: 6 (ref 5.0–8.0)
pH: 5 (ref 5.0–8.0)

## 2020-10-13 LAB — COMPREHENSIVE METABOLIC PANEL
ALT: 11 U/L (ref 0–35)
AST: 18 U/L (ref 0–37)
Albumin: 3.2 g/dL — ABNORMAL LOW (ref 3.5–5.2)
Alkaline Phosphatase: 92 U/L (ref 39–117)
BUN: 82 mg/dL — ABNORMAL HIGH (ref 6–23)
CO2: 23 mEq/L (ref 19–32)
Calcium: 9.9 mg/dL (ref 8.4–10.5)
Chloride: 101 mEq/L (ref 96–112)
Creatinine, Ser: 3.33 mg/dL — ABNORMAL HIGH (ref 0.40–1.20)
GFR: 11.62 mL/min — CL (ref >60.00)
Glucose, Bld: 146 mg/dL — ABNORMAL HIGH (ref 70–99)
Potassium: 6.5 mEq/L (ref 3.5–5.1)
Sodium: 131 mEq/L — ABNORMAL LOW (ref 135–145)
Total Bilirubin: 0.4 mg/dL (ref 0.2–1.2)
Total Protein: 6.9 g/dL (ref 6.0–8.3)

## 2020-10-13 LAB — POCT URINALYSIS DIPSTICK
Bilirubin, UA: NEGATIVE
Glucose, UA: NEGATIVE
Ketones, UA: NEGATIVE
Nitrite, UA: NEGATIVE
Protein, UA: POSITIVE — AB
Spec Grav, UA: 1.02 (ref 1.010–1.025)
Urobilinogen, UA: 0.2 E.U./dL
pH, UA: 6 (ref 5.0–8.0)

## 2020-10-13 LAB — CBC WITH DIFFERENTIAL/PLATELET
Abs Immature Granulocytes: 0.04 10*3/uL (ref 0.00–0.07)
Basophils Absolute: 0.1 10*3/uL (ref 0.0–0.1)
Basophils Relative: 1 %
Eosinophils Absolute: 0.3 10*3/uL (ref 0.0–0.5)
Eosinophils Relative: 2 %
HCT: 41.6 % (ref 36.0–46.0)
Hemoglobin: 13.4 g/dL (ref 12.0–15.0)
Immature Granulocytes: 0 %
Lymphocytes Relative: 22 %
Lymphs Abs: 2.7 10*3/uL (ref 0.7–4.0)
MCH: 30.7 pg (ref 26.0–34.0)
MCHC: 32.2 g/dL (ref 30.0–36.0)
MCV: 95.2 fL (ref 80.0–100.0)
Monocytes Absolute: 0.9 10*3/uL (ref 0.1–1.0)
Monocytes Relative: 8 %
Neutro Abs: 8.2 10*3/uL — ABNORMAL HIGH (ref 1.7–7.7)
Neutrophils Relative %: 67 %
Platelets: 424 10*3/uL — ABNORMAL HIGH (ref 150–400)
RBC: 4.37 MIL/uL (ref 3.87–5.11)
RDW: 14.2 % (ref 11.5–15.5)
WBC: 12.2 10*3/uL — ABNORMAL HIGH (ref 4.0–10.5)
nRBC: 0 % (ref 0.0–0.2)

## 2020-10-13 LAB — I-STAT CHEM 8, ED
BUN: 71 mg/dL — ABNORMAL HIGH (ref 8–23)
Calcium, Ion: 1.15 mmol/L (ref 1.15–1.40)
Chloride: 112 mmol/L — ABNORMAL HIGH (ref 98–111)
Creatinine, Ser: 3.5 mg/dL — ABNORMAL HIGH (ref 0.44–1.00)
Glucose, Bld: 94 mg/dL (ref 70–99)
HCT: 38 % (ref 36.0–46.0)
Hemoglobin: 12.9 g/dL (ref 12.0–15.0)
Potassium: 6.3 mmol/L (ref 3.5–5.1)
Sodium: 133 mmol/L — ABNORMAL LOW (ref 135–145)
TCO2: 19 mmol/L — ABNORMAL LOW (ref 22–32)

## 2020-10-13 LAB — LACTIC ACID, PLASMA
Lactic Acid, Venous: 1.2 mmol/L (ref 0.5–1.9)
Lactic Acid, Venous: 1.2 mmol/L (ref 0.5–1.9)

## 2020-10-13 LAB — RESP PANEL BY RT-PCR (FLU A&B, COVID) ARPGX2
Influenza A by PCR: NEGATIVE
Influenza B by PCR: NEGATIVE
SARS Coronavirus 2 by RT PCR: NEGATIVE

## 2020-10-13 MED ORDER — ALBUTEROL SULFATE (2.5 MG/3ML) 0.083% IN NEBU
5.0000 mg | INHALATION_SOLUTION | Freq: Once | RESPIRATORY_TRACT | Status: AC
  Administered 2020-10-13: 5 mg via RESPIRATORY_TRACT
  Filled 2020-10-13: qty 6

## 2020-10-13 MED ORDER — ACETAMINOPHEN 325 MG PO TABS
650.0000 mg | ORAL_TABLET | Freq: Four times a day (QID) | ORAL | Status: DC | PRN
  Administered 2020-10-14: 650 mg via ORAL
  Filled 2020-10-13: qty 2

## 2020-10-13 MED ORDER — SODIUM CHLORIDE 0.9 % IV SOLN
1.0000 g | Freq: Once | INTRAVENOUS | Status: AC
  Administered 2020-10-13: 1 g via INTRAVENOUS
  Filled 2020-10-13: qty 10

## 2020-10-13 MED ORDER — SODIUM CHLORIDE 0.9 % IV SOLN
1.0000 g | INTRAVENOUS | Status: DC
  Administered 2020-10-14 – 2020-10-15 (×2): 1 g via INTRAVENOUS
  Filled 2020-10-13 (×2): qty 10

## 2020-10-13 MED ORDER — ENOXAPARIN SODIUM 30 MG/0.3ML IJ SOSY
30.0000 mg | PREFILLED_SYRINGE | INTRAMUSCULAR | Status: DC
  Administered 2020-10-14 – 2020-10-16 (×3): 30 mg via SUBCUTANEOUS
  Filled 2020-10-13 (×3): qty 0.3

## 2020-10-13 MED ORDER — ONDANSETRON HCL 4 MG PO TABS: 4.0000 mg | ORAL_TABLET | Freq: Four times a day (QID) | ORAL | Status: DC | PRN

## 2020-10-13 MED ORDER — SODIUM ZIRCONIUM CYCLOSILICATE 10 G PO PACK
10.0000 g | PACK | Freq: Once | ORAL | Status: AC
  Administered 2020-10-13: 10 g via ORAL
  Filled 2020-10-13: qty 1

## 2020-10-13 MED ORDER — LACTATED RINGERS IV SOLN: INTRAVENOUS | Status: DC

## 2020-10-13 MED ORDER — ACETAMINOPHEN 325 MG PO TABS
650.0000 mg | ORAL_TABLET | Freq: Once | ORAL | Status: AC
  Administered 2020-10-13: 650 mg via ORAL
  Filled 2020-10-13: qty 2

## 2020-10-13 MED ORDER — ONDANSETRON HCL 4 MG/2ML IJ SOLN: 4.0000 mg | Freq: Four times a day (QID) | INTRAMUSCULAR | Status: DC | PRN

## 2020-10-13 MED ORDER — ACETAMINOPHEN 650 MG RE SUPP: 650.0000 mg | Freq: Four times a day (QID) | RECTAL | Status: DC | PRN

## 2020-10-13 MED ORDER — SULFAMETHOXAZOLE-TRIMETHOPRIM 800-160 MG PO TABS: 1.0000 | ORAL_TABLET | Freq: Two times a day (BID) | ORAL | 0 refills | Status: DC

## 2020-10-13 MED ORDER — SODIUM CHLORIDE 0.9 % IV BOLUS
1000.0000 mL | Freq: Once | INTRAVENOUS | Status: AC
  Administered 2020-10-13: 1000 mL via INTRAVENOUS

## 2020-10-13 NOTE — ED Triage Notes (Signed)
Pt sent here from PCP due to abnormal kidney function , elevated K level.

## 2020-10-13 NOTE — ED Notes (Signed)
Pt daughter helps pt up multiple times to bedside commode. Pt only dribbling a little urine at a time.

## 2020-10-13 NOTE — ED Notes (Addendum)
Dr. Olevia Bowens notified of temp - no further orders at this time

## 2020-10-13 NOTE — Telephone Encounter (Signed)
Can you call and let Secretary know patient is coming? Called and spoke with daughter and son on the phone about elevated potassium and creatinine levels. Likely mass (probable renal cell carcinoma) has grown and may be obstructing kidneys. We talked about chance high potassium levels could stop her heart and need for urgent intervention at hospital if desired or likely risk of death in near future if we decided to keep her at home and would do hospice referral. Son and daughter elect to take her to hospital for treatment of potassium, assessment for potential reversal of obstruction if present. They understand that likely the mass has grown and that intervention may not be guaranteed. They do not want chemo or surgery for the kidney lesion regardless and want to be informed so they can help guide goals of care with her.

## 2020-10-13 NOTE — Telephone Encounter (Signed)
CRITICAL Potassium @ 6.5 & GFR 11.62

## 2020-10-13 NOTE — H&P (Signed)
History and Physical    [name] [id number] DOB: 06/30/1926 DOA: 10/13/2020  PCP: Hoyt Koch, MD   Patient coming from: Home.  I have personally briefly reviewed patient's old medical records in [place]  Chief Complaint: Abnormal labs.  HPI: [name] is a 87 y.o. female with medical history significant of allergy rhinitis, COPD, reactive airway disease, colon polyps, diverticulosis, hyperlipidemia, hypertension, hypokalemia, unspecified radiculopathy who was sent to the emergency department by her physician due to abnormal labs showing AKI and hyperkalemia.  The patient's daughter stated that she has been treated twice with oral antibiotics over the last month for UTI without significant results.  The patient has been having decreased oral intake for the past few weeks, she has been sleeping more than usual and her mentation seems to be decreased particularly since Saturday.  No fever, chills, rigors, headache, sore throat rhinorrhea.  No chest pain, productive cough, dyspnea, abdominal pain, nausea, emesis, diarrhea or constipation.  No polyuria, polydipsia, polyphagia or blurred vision.  ED Course: Initial vital signs were temperature 98.4 F, pulse 92, respirations 16, BP 112/66 mmHg O2 sat 97% on room air.  The patient received a 1000 mL LR bolus, Lokelma 10 g p.o. x1, a 5 mg albuterol neb and 1 g of Rocephin IVP.  Lab work: Her urinalysis showed moderate leukocyte esterase, 6-10 RBC, more than 50 WBC with a few bacteria.  There were positive white BC clumps and hyaline casts.  CBC showed a white count of 12.2 with 67% neutrophils and 22% lymphocytes.  Hemoglobin 13.4 g/dL platelets 424.  Lactic acid was normal twice.  Sodium 133, potassium 6.3, chloride 112 BUN 71 and creatinine 3.50 mg/dL.  Glucose 94.  Imaging: CT renal study showed numerous bilateral kidney stones including a 22 mm right renal pelvic stone with mild upper pole hydronephrosis.  There  is a slight interval increase in size of solid-appearing mass lesion on the lower pole of the left kidney.  Slightly thick urinary bladder raising suspicions for cystitis.  Please see images and full radiology report for further detail.  Review of Systems: As per HPI otherwise all other systems reviewed and are negative.  Past Medical History:  Diagnosis Date  . ALLERGIC RHINITIS 05/29/2007  . CHRONIC OBSTRUCTIVE PULMONARY DISEASE, ACUTE EXACERBATION 11/21/2007  . COLONIC POLYPS, HX OF 11/21/2007  . COPD 11/21/2007  . DIVERTICULOSIS, COLON 11/21/2007  . HIP PAIN, LEFT, CHRONIC 06/01/2010  . HYPERLIPIDEMIA 05/29/2007  . HYPERTENSION 05/29/2007  . Hypopotassemia 06/12/2007  . HYSTERECTOMY, HX OF 05/29/2007  . Other acquired absence of organ 05/29/2007  . Other postprocedural status(V45.89) 05/29/2007  . RADICULOPATHY 05/29/2007  . REACTIVE AIRWAY DISEASE 05/29/2007   Past Surgical History:  Procedure Laterality Date  . ABDOMINAL HYSTERECTOMY    . APPENDECTOMY    . CATARACT EXTRACTION Bilateral 2016   Dr. Katy Fitch  . CATARACT EXTRACTION W/ INTRAOCULAR LENS IMPLANT Bilateral 05/2015   around december 9th and 28   . DILATION AND CURETTAGE OF UTERUS    . LIPOMA EXCISION    . LUMBAR LAMINECTOMY    . PARTIAL HIP ARTHROPLASTY Left 2012  . TONSILLECTOMY     Social History  reports that she quit smoking about 40 years ago. She has never used smokeless tobacco. She reports that she does not drink alcohol and does not use drugs.  Allergies  Allergen Reactions  . Prednisone     Patient became jittery all over  . Codeine    Family  History  Problem Relation Age of Onset  . Arthritis Mother   . Heart disease Mother   . Uterine cancer Daughter   . Cancer Daughter        ovarian  . Heart disease Father   . Diabetes Neg Hx   . COPD Neg Hx   . Stroke Neg Hx   . Kidney disease Neg Hx    Prior to Admission medications   Medication Sig Start Date End Date Taking? Authorizing Provider   amLODipine (NORVASC) 10 MG tablet Take 1 tablet (10 mg total) by mouth daily. 10/28/19   Hoyt Koch, MD  fluticasone furoate-vilanterol (BREO ELLIPTA) 100-25 MCG/INH AEPB Inhale 1 puff into the lungs 2 (two) times daily. 10/28/19   Hoyt Koch, MD  Multiple Vitamins-Minerals (CENTRUM SILVER) tablet Take 1 tablet by mouth daily.    [provider]  naproxen sodium (ANAPROX) 220 MG tablet Take 220 mg by mouth 2 (two) times daily with a meal.    [provider]  PROAIR HFA 108 (90 Base) MCG/ACT inhaler INHALE 2 PUFFS INTO THE LUNGS EVERY 6 HOURS AS NEEDED FOR WHEEZING ORSHORTNESS OF BREATH 10/28/19   Hoyt Koch, MD  RESTASIS 0.05 % ophthalmic emulsion Apply to eye 2 (two) times daily. Patient not taking: Reported on 10/13/2020 03/14/18   [provider]  sulfamethoxazole-trimethoprim (BACTRIM DS) 800-160 MG tablet Take 1 tablet by mouth 2 (two) times daily. 10/13/20   Hoyt Koch, MD   Physical Exam: Vitals:   10/13/20 2100 10/13/20 2151 10/13/20 2200 10/13/20 2210  BP: (!) 103/55  (!) 114/57   Pulse: 77  79   Resp: (!) 25  (!) 25   Temp:  (!) 97.5 F (36.4 C)    TempSrc:  Axillary    SpO2: 95%  98% 97%   Constitutional: Looks chronically ill.  Currently in NAD, calm, comfortable Eyes: PERRL, sclerae, lids and conjunctivae mildly injected. ENMT: Mucous membranes are dry.. Posterior pharynx clear of any exudate or lesions.Normal dentition. mal, supple, no masses, no thyromegaly Respiratory: clear to auscultation bilaterally, no wheezing, no crackles. Normal respiratory effort. No accessory muscle use.  Cardiovascular: Regular rate and rhythm, no murmurs / rubs / gallops. No extremity edema. 2+ pedal pulses. No carotid bruits.  Abdomen: No distention.  Bowel sounds positive.  Soft, positive suprapubic tenderness, no masses palpated. No hepatosplenomegaly. Musculoskeletal: no clubbing / cyanosis.  Believes good ROM, no contractures.  Normal muscle tone.  Skin: Some areas of ecchymosis from venipuncture some forearms. Neurologic: CN 2-12 grossly intact. Sensation intact, DTR normal. Strength 5/5 in all 4.  Psychiatric: Alert and oriented x 2, partially oriented to time and situation.. Normal mood.   Labs on Admission: I have personally reviewed following labs and imaging studies  CBC: Recent Labs  Lab 10/13/20 1029 10/13/20 1542 10/13/20 2058  WBC 10.5 12.2*  --   NEUTROABS  --  8.2*  --   HGB 13.3 13.4 12.9  HCT 40.2 41.6 38.0  MCV 92.9 95.2  --   PLT 425.0* 424*  --    Basic Metabolic Panel: Recent Labs  Lab 10/13/20 1029 10/13/20 1942 10/13/20 2058  NA 131* 133* 133*  K 6.5* 6.4* 6.3*  CL 101 106 112*  CO2 23 20*  --   GLUCOSE 146* 96 94  BUN 82* 78* 71*  CREATININE 3.33* 3.26* 3.50*  CALCIUM 9.9 9.5  --     GFR: Estimated Creatinine Clearance: 7.8 mL/min (A) (by C-G formula  based on SCr of 3.5 mg/dL (H)).  Liver Function Tests: Recent Labs  Lab 10/13/20 1029 10/13/20 1942  AST 18 18  ALT 11 14  ALKPHOS 92 79  BILITOT 0.4 0.8  PROT 6.9 6.4*  ALBUMIN 3.2* 2.6*   Urine analysis:    Component Value Date/Time   COLORURINE YELLOW 10/13/2020 1553   APPEARANCEUR TURBID (A) 10/13/2020 1553   LABSPEC 1.013 10/13/2020 1553   PHURINE 5.0 10/13/2020 1553   GLUCOSEU NEGATIVE 10/13/2020 1553   GLUCOSEU Negative 10/13/2020 1037   HGBUR SMALL (A) 10/13/2020 1553   BILIRUBINUR NEGATIVE 10/13/2020 1553   BILIRUBINUR Negative 10/13/2020 1018   KETONESUR NEGATIVE 10/13/2020 1553   PROTEINUR 100 (A) 10/13/2020 1553   UROBILINOGEN 0.2 mg/dL (A) 10/13/2020 1037   UROBILINOGEN 0.2 10/13/2020 1018   NITRITE NEGATIVE 10/13/2020 1553   LEUKOCYTESUR MODERATE (A) 10/13/2020 1553   Radiological Exams on Admission: CT Renal Stone Study  Result Date: 10/13/2020 CLINICAL DATA:  Hematuria renal parenchymal disease EXAM: CT ABDOMEN AND PELVIS WITHOUT CONTRAST TECHNIQUE: Multidetector CT imaging of the  abdomen and pelvis was performed following the standard protocol without IV contrast. COMPARISON:  CT 04/26/2019 FINDINGS: Lower chest: Lung bases demonstrate no acute consolidation or effusion. Mild cardiomegaly. Coronary calcifications. Hepatobiliary: No calcified gallstone. No focal hepatic abnormality or biliary dilatation. Pancreas: Unremarkable. No pancreatic ductal dilatation or surrounding inflammatory changes. Spleen: Normal in size without focal abnormality. Adrenals/Urinary Tract: Adrenal glands are within normal limits. 22 mm stone in the right renal pelvis with mild upper pole hydronephrosis. Numerous stones within the lower pole collecting system on the right. Multiple left-sided kidney stones, the largest is also seen in the left renal pelvis and measures 8 mm. 9 mm hyperdense nodule upper pole right kidney, slightly more apparent. 14 mm hyperdense exophytic nodule midpole right kidney without change. Cysts within the bilateral kidneys. Solid appearing mass exophytic to the lower pole left kidney measures 4.2 x 3.6 cm by 5.9 cm, previously 3.9 x 3.4 x 5.5 cm. Slightly thick-walled urinary bladder Stomach/Bowel: Stomach nonenlarged. No dilated small bowel. No acute bowel wall thickening. Diverticular disease of the left colon without acute inflammatory process. Vascular/Lymphatic: Moderate aortic atherosclerosis. No aneurysm. No increasing adenopathy. Stable 11 mm oval nodule adjacent to the left hepatic lobe. Reproductive: Status post hysterectomy. No adnexal masses. Other: Negative for free air or free fluid. Musculoskeletal: Left hip replacement with artifact. Scoliosis of the spine. 9 mm anterolisthesis L4 on L5. Advanced degenerative changes throughout the lumbar spine. IMPRESSION: 1. Numerous bilateral kidney stones, including a 22 mm right renal pelvic stone with mild upper pole hydronephrosis. 2. Slight interval increase in size of solid appearing mass lesion involving lower pole left kidney,  now measuring up to 5.9 cm and suspicious for carcinoma. 3. Slightly thick-walled urinary bladder is questionable for mild cystitis 4. Extensive left colon diverticular disease without acute inflammation Electronically Signed   By: Donavan Foil M.D.   On: 10/13/2020 21:52    EKG: Independently reviewed.  Vent. rate 89 BPM PR interval 178 ms QRS duration 140 ms QT/QTcB 390/474 ms P-R-T axes 95 -70 100 Normal sinus rhythm Left axis deviation Left bundle branch block Abnormal ECG  Assessment/Plan Principal Problem:   AKI (acute kidney injury) ([place]) Observation/progressive unit. Received 5 mg albuterol neb and Lokelma in ED Continue LR 125 mL/h over night. Monitor intake and output. Follow-up renal function electrolytes.  Active Problems:   Acute lower UTI Continue ceftriaxone 1 g IVPB. Continue IV fluids. Follow-up urine  culture and sensitivity.    Hyperkalemia Received initial treatment in ED. Continue IV fluids monitor potassium level..    Hyperlipidemia Not on statin.    Essential hypertension Continue amlodipine 10 mg p.o. daily.    COPD, mild (HCC) Continue Breo Ellipta 100-25 twice daily. Supplemental oxygen and SABA as needed.    Left kidney mass Given her age, no further intervention per family.    Severe protein-calorie malnutrition ([place]) Consult nutritional services.    DVT prophylaxis: Lovenox SQ. Code Status:   DNR. Family Communication:  Her daughter Rosann Auerbach was in the room and provided history. Disposition Plan:   Patient is from:  Home.  Anticipated DC to:  SNF or assisted living.  Anticipated DC date:  10/16/2020.  Anticipated DC barriers: Clinical status/pending UC&S.  Consults called: Admission status:  Observation/telemetry.  Severity of Illness:  High severity due to presenting with AKI with hyperkalemia in the setting of acute urinary tract infection that has failed outpatient therapy.  The patient will need to remain for IV antibiotic  therapy for 2 to 3 days.  Reubin Milan MD [facility]  How to contact the [facility] Attending or Consulting provider [place] or covering provider during after hours [place], for this patient?   1. Check the care team in [facility] and look for a) attending/consulting [facility] provider listed and b) the [facility] team listed 2. Log into [email or web address] and use [place]'s universal password to access. If you do not have the password, please contact the hospital operator. 3. Locate the [facility] provider you are looking for under [facility] and page to a number that you can be directly reached. 4. If you still have difficulty reaching the provider, please page the [facility] (Director on Call) for the Hospitalists listed on amion for assistance.  10/13/2020, 10:55 PM   This document was prepared using Dragon voice recognition software may contain some unintended transcription errors.

## 2020-10-13 NOTE — ED Notes (Signed)
This RN was rounding on pts at 1900 shift start. On arrival to pt room pt daughter at bedside seemed upset that pt had not been seen in a timely manner and was upset that she was having to take pt to bathroom. This RN offered to help take pt to bathroom to which pt daughter responded "I've been doing all of the work anyways. I would not have to do it if you guys just put a foley in her." This RN informed pt daughter that this RN just arrived and would help in any way that I could. Pt daughter took pt to bathroom. When they returned back to room this RN offered to put pt on purewick. Daughter declined purewick saying that pt would not use it. This RN and Dyann Ruddle NT informed daughter to use call bell when help is needed and we would be there as soon as we could. Pt daughter asking about CT and antibiotics. This RN informed pt daughter that I would ask the doctor. This RN went and updated PA and Dr. Blood to be collected.

## 2020-10-13 NOTE — ED Notes (Signed)
Dr. Johnney Killian notified of critical potassium level

## 2020-10-13 NOTE — ED Provider Notes (Signed)
[facility] EMERGENCY DEPARTMENT Provider Note   CSN: [id number] Arrival date & time: 10/13/20  1410     History Chief Complaint  Patient presents with  . Abnormal Labs    [name] is a 87 y.o. female with pertinent past medical she of hypertension, hyperlipidemia, COPD, reactive airway disease, advanced dementia that presents the emerge department today for elevated potassium level.  Patient was referred to the from PCP due to high potassium and creatinine.  Patient is level 5 caveat due to dementia.  Daughter who is in the room is able to tell me most of HPI.  Daughter states patient has had a UTI for the past 2 months, is on third round of antibiotics.  States that she has not started her Bactrim that was given today which is why she went to the PCP.  States that she was given Bactrim before, is trying to find prior antibiotics.  Lab work from PCP office shows potassium of 6.5 with creatinine of 10.3.  Patient baseline is 1.1. Urinalysis appears infected.  Patient is recommending anything.  No nausea or vomiting or fevers at home.  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  No other complaints at this time. HPI     Past Medical History:  Diagnosis Date  . ALLERGIC RHINITIS 05/29/2007  . CHRONIC OBSTRUCTIVE PULMONARY DISEASE, ACUTE EXACERBATION 11/21/2007  . COLONIC POLYPS, HX OF 11/21/2007  . COPD 11/21/2007  . DIVERTICULOSIS, COLON 11/21/2007  . HIP PAIN, LEFT, CHRONIC 06/01/2010  . HYPERLIPIDEMIA 05/29/2007  . HYPERTENSION 05/29/2007  . Hypopotassemia 06/12/2007  . HYSTERECTOMY, HX OF 05/29/2007  . Other acquired absence of organ 05/29/2007  . Other postprocedural status(V45.89) 05/29/2007  . RADICULOPATHY 05/29/2007  . REACTIVE AIRWAY DISEASE 05/29/2007    Patient Active Problem List   Diagnosis Date Noted  . Urinary urgency 10/13/2020  . AKI  (acute kidney injury) ([place]) 10/13/2020  . Advanced care planning/counseling discussion 07/17/2013  . Chronic low back pain 06/18/2012  . Routine health maintenance 06/18/2011  . COPD, mild ([place]) 11/21/2007  . Hyperlipidemia 05/29/2007  . Essential hypertension 05/29/2007  . Allergic rhinitis 05/29/2007    Past Surgical History:  Procedure Laterality Date  . ABDOMINAL HYSTERECTOMY    . APPENDECTOMY    . CATARACT EXTRACTION Bilateral 2016   Dr. Katy Fitch  . CATARACT EXTRACTION W/ INTRAOCULAR LENS IMPLANT Bilateral 05/2015   around december 9th and 30   . DILATION AND CURETTAGE OF UTERUS    . LIPOMA EXCISION    . LUMBAR LAMINECTOMY    . PARTIAL HIP ARTHROPLASTY Left 2012  . TONSILLECTOMY       OB History   No obstetric history on file.     Family History  Problem Relation Age of Onset  . Arthritis Mother   . Heart disease Mother   . Uterine cancer Daughter   . Cancer Daughter        ovarian  . Heart disease Father   . Diabetes Neg Hx   . COPD Neg Hx   . Stroke Neg Hx   . Kidney disease Neg Hx     Social History   Tobacco Use  . Smoking status: Former Smoker    Quit date: 06/14/1980    Years since quitting: 40.3  . Smokeless tobacco: Never Used  Vaping Use  . Vaping Use: Never used  Substance Use  Topics  . Alcohol use: No    Alcohol/week: 0.0 standard drinks  . Drug use: No    Home Medications Prior to Admission medications   Medication Sig Start Date End Date Taking? Authorizing Provider  amLODipine (NORVASC) 10 MG tablet Take 1 tablet (10 mg total) by mouth daily. 10/28/19   Hoyt Koch, MD  fluticasone furoate-vilanterol (BREO ELLIPTA) 100-25 MCG/INH AEPB Inhale 1 puff into the lungs 2 (two) times daily. 10/28/19   Hoyt Koch, MD  Multiple Vitamins-Minerals (CENTRUM SILVER) tablet Take 1 tablet by mouth daily.    [provider]  naproxen sodium (ANAPROX) 220 MG tablet Take 220 mg by mouth 2 (two) times daily with a meal.     [provider]  PROAIR HFA 108 (90 Base) MCG/ACT inhaler INHALE 2 PUFFS INTO THE LUNGS EVERY 6 HOURS AS NEEDED FOR WHEEZING ORSHORTNESS OF BREATH 10/28/19   Hoyt Koch, MD  RESTASIS 0.05 % ophthalmic emulsion Apply to eye 2 (two) times daily. Patient not taking: Reported on 10/13/2020 03/14/18   [provider]  sulfamethoxazole-trimethoprim (BACTRIM DS) 800-160 MG tablet Take 1 tablet by mouth 2 (two) times daily. 10/13/20   Hoyt Koch, MD    Allergies    Prednisone and Codeine  Review of Systems   Review of Systems  Constitutional: Negative for chills, diaphoresis, fatigue and fever.  HENT: Negative for congestion, sore throat and trouble swallowing.   Eyes: Negative for pain and visual disturbance.  Respiratory: Negative for cough, shortness of breath and wheezing.   Cardiovascular: Negative for chest pain, palpitations and leg swelling.  Gastrointestinal: Negative for abdominal distention, abdominal pain, diarrhea, nausea and vomiting.  Genitourinary: Positive for frequency. Negative for decreased urine volume, difficulty urinating, dysuria, genital sores, hematuria, menstrual problem, pelvic pain, urgency, vaginal bleeding, vaginal discharge and vaginal pain.  Musculoskeletal: Negative for back pain, neck pain and neck stiffness.  Skin: Negative for pallor.  Neurological: Negative for dizziness, speech difficulty, weakness and headaches.  Psychiatric/Behavioral: Negative for confusion.    Physical Exam Updated Vital Signs BP (!) 111/45   Pulse 73   Temp 98.4 F (36.9 C) (Oral)   Resp (!) 22   SpO2 97%   Physical Exam Constitutional:      General: She is not in acute distress.    Appearance: Normal appearance. She is not ill-appearing, toxic-appearing or diaphoretic.  HENT:     Mouth/Throat:     Mouth: Mucous membranes are moist.     Pharynx: Oropharynx is clear.  Eyes:     General: No scleral icterus.    Extraocular Movements:  Extraocular movements intact.     Pupils: Pupils are equal, round, and reactive to light.  Cardiovascular:     Rate and Rhythm: Normal rate and regular rhythm.     Pulses: Normal pulses.     Heart sounds: Normal heart sounds.  Pulmonary:     Effort: Pulmonary effort is normal. No respiratory distress.     Breath sounds: Normal breath sounds. No stridor. No wheezing, rhonchi or rales.  Chest:     Chest wall: No tenderness.  Abdominal:     General: Abdomen is flat. There is no distension.     Palpations: Abdomen is soft.     Tenderness: There is no abdominal tenderness. There is no guarding or rebound.     Comments: No CVA tenderness.   Musculoskeletal:        General: No swelling or tenderness. Normal range of motion.  Cervical back: Normal range of motion and neck supple. No rigidity.     Right lower leg: No edema.     Left lower leg: No edema.  Skin:    General: Skin is warm and dry.     Capillary Refill: Capillary refill takes less than 2 seconds.     Coloration: Skin is not pale.  Neurological:     General: No focal deficit present.     Mental Status: She is alert and oriented to person, place, and time.  Psychiatric:        Mood and Affect: Mood normal.        Behavior: Behavior normal.     ED Results / Procedures / Treatments   Labs (all labs ordered are listed, but only abnormal results are displayed) Labs Reviewed  CBC WITH DIFFERENTIAL/PLATELET - Abnormal; Notable for the following components:      Result Value   WBC 12.2 (*)    Platelets 424 (*)    Neutro Abs 8.2 (*)    All other components within normal limits  URINALYSIS, ROUTINE W REFLEX MICROSCOPIC - Abnormal; Notable for the following components:   APPearance TURBID (*)    Hgb urine dipstick SMALL (*)    Protein, ur 100 (*)    Leukocytes,Ua MODERATE (*)    WBC, UA >50 (*)    Bacteria, UA FEW (*)    All other components within normal limits  RESP PANEL BY RT-PCR (FLU A&B, COVID) [id number]  URINE  CULTURE  COMPREHENSIVE METABOLIC PANEL    EKG EKG Interpretation  Date/Time:  Tuesday Oct 13 2020 15:27:20 EDT Ventricular Rate:  89 PR Interval:  178 QRS Duration: 140 QT Interval:  390 QTC Calculation: 474 R Axis:   -70 Text Interpretation: Normal sinus rhythm Left axis deviation Left bundle branch block Abnormal ECG LBBB new from previous Confirmed by Theotis Burrow [phone]) on 10/13/2020 3:32:59 PM   Radiology No results found.  Procedures Procedures   Medications Ordered in ED Medications  sodium chloride 0.9 % bolus 1,000 mL (1,000 mLs Intravenous New Bag/Given 10/13/20 1641)    ED Course  I have reviewed the triage vital signs and the nursing notes.  Pertinent labs & imaging results that were available during my care of the patient were reviewed by me and considered in my medical decision making (see chart for details).  Clinical Course as of 10/13/20 [place] Oct 13, 2020  2227 Consult:urology Dr. Lovena Neighbours.  No intervention for stones in the kidney.  Treat the UTI. [MP]  2234 Consult: Dr. Olevia Bowens accepts for admission [MP]    Clinical Course User Index [MP] Charlesetta Shanks, MD   MDM Rules/Calculators/A&P                         Patient presenting with hyperkalemia from PCP office.  Unfortunately CMP has not been resulted in the time for shift change.  Patient has been handed off to Dr. Vallery Ridge at 7.  EKG without any elevated T waves, appears stable.   Complete history and physical and current plan have been communicated.  Please refer to their note for the remainder of ED care and ultimate disposition.  I discussed this case with my attending physician who cosigned this note including patient's presenting symptoms, physical exam, and planned diagnostics and interventions. Attending physician stated agreement with plan or made changes to plan which were implemented.   Attending physician assessed patient at bedside Final Clinical Impression(s) /  ED Diagnoses Final  diagnoses:  AKI (acute kidney injury) [facility])    Rx / DC Orders ED Discharge Orders    None       Alfredia Client, PA-C 10/13/20 1915    Charlesetta Shanks, MD 10/13/20 2004

## 2020-10-13 NOTE — Telephone Encounter (Signed)
Spoke with the charge nurse at the ED to make her aware that the patient is on her way. Per Dr. Sharlet Salina reason for needing to be seen in the ED Acute kidney failure with elevated potassium.

## 2020-10-13 NOTE — ED Provider Notes (Signed)
Emergency Medicine Provider Triage Evaluation Note  JAIMEE CORUM , a 87 y.o. female  was evaluated in triage.  Pt complains of abnormal labs.  Went to PCP earlier, k is elevated, CR is elevated.  Referred to the ER.  Review of Systems  Positive: Urinary frequency Negative: fever  Physical Exam  BP 112/66 (BP Location: Left Arm)   Pulse 92   Temp 98.4 F (36.9 C) (Oral)   Resp 16   SpO2 97%  Gen:   Awake, no distress  At baseline per family Resp:  Normal effort  MSK:   Moves extremities without difficulty    Medical Decision Making  Medically screening exam initiated at 3:11 PM.  Appropriate orders placed.  Hasini Peachey Taketa was informed that the remainder of the evaluation will be completed by another provider, this initial triage assessment does not replace that evaluation, and the importance of remaining in the ED until their evaluation is complete.     Lorin Glass, [place] 10/13/20 1513    Little, Wenda Overland, MD 10/16/20 [phone]

## 2020-10-13 NOTE — Assessment & Plan Note (Signed)
U/A done in the office with infection present. Rx bactrim. Urine culture ordered.

## 2020-10-13 NOTE — Progress Notes (Signed)
RT to give nebulizer. Pt not available. To CT

## 2020-10-13 NOTE — ED Notes (Signed)
This RN and Yvone Neu NT had to straight stick pt for CMP. This RN was unaware that CMP was not drawn or not resulted and drew it as soon as issue arose. After sticking pt for CMP order was DC and Istat order put in. This RN notified Dr. Johnney Killian of situation and Dr. Johnney Killian is okay with CMP at this point.

## 2020-10-13 NOTE — Assessment & Plan Note (Signed)
Discussed today with daughter and son regarding scope of care. They will pursue hospital today for high potassium for treatment and evaluate for potential reversibility with stenting (if obstruction) for renal lesion. If not reversible they will need to consider this. She would not want prolonged hospital stay. They do decline chemo or surgery for renal lesion.

## 2020-10-13 NOTE — ED Notes (Signed)
Phleb to get pt labs

## 2020-10-13 NOTE — Progress Notes (Signed)
   Subjective:   Patient ID: [name], female    DOB: 08-07-1926, 87 y.o.   MRN: [id number]  HPI The patient is a 87 YO female coming in for frequency of feeling like she has to urinate. Her daughter helps to provide history. She denies fevers or chills or confusion recently. Denies known abdominal pain. She has lost about 24 pounds in the last few months. She does have known prior renal mass last imaged 2020. They did not elect treatment or work up of this. She has poor appetite and overall QOL is declining. Daughter is not sure that much urine is coming out when she urinates although she tries to go often.  Review of Systems  Unable to perform ROS: Dementia  Constitutional: Positive for unexpected weight change.  Genitourinary: Positive for frequency and urgency.    Objective:  Physical Exam Constitutional:      Appearance: She is well-developed. She is ill-appearing.  HENT:     Head: Normocephalic and atraumatic.  Cardiovascular:     Rate and Rhythm: Normal rate and regular rhythm.  Pulmonary:     Effort: Pulmonary effort is normal. No respiratory distress.     Breath sounds: Normal breath sounds. No wheezing or rales.  Abdominal:     General: Bowel sounds are normal. There is no distension.     Palpations: Abdomen is soft.     Tenderness: There is no abdominal tenderness. There is no rebound.  Musculoskeletal:     Cervical back: Normal range of motion.  Skin:    General: Skin is warm and dry.  Neurological:     Mental Status: She is alert. Mental status is at baseline.     Coordination: Coordination abnormal.     Vitals:   10/13/20 0954  BP: 116/60  Pulse: 95  Resp: 18  Temp: 98.2 F (36.8 C)  TempSrc: Oral  SpO2: 96%  Weight: 104 lb 9.6 oz (47.4 kg)  Height: 5\' 4"  (1.626 m)    This visit occurred during the SARS-CoV-2 public health emergency.  Safety protocols were in place, including screening questions prior to the visit, additional usage of staff PPE,  and extensive cleaning of exam room while observing appropriate contact time as indicated for disinfecting solutions.   Assessment & Plan:  Visit time 15 minutes in face to face communication with patient and coordination of care, additional 15 minutes spent in record review, coordination or care with son and daughter after lab results returned, ordering tests, communicating/referring to other healthcare professionals, documenting in medical records all on the same day of the visit for total time 30 minutes spent on the visit.

## 2020-10-13 NOTE — ED Notes (Signed)
Pt to CT

## 2020-10-13 NOTE — Assessment & Plan Note (Signed)
Called and spoke with daughter and son on the phone about elevated potassium and creatinine levels from labs CBC and CMP ordered today. Likely mass (probable renal cell carcinoma) has grown and may be obstructing kidneys. We talked about chance high potassium levels could stop her heart and need for urgent intervention at hospital if desired or likely risk of death in near future if we decided to keep her at home and would do hospice referral. Son and daughter elect to take her to hospital for treatment of potassium, assessment for potential reversal of obstruction if present. They understand that likely the mass has grown and that intervention may not be guaranteed. They do not want chemo or surgery for the kidney lesion regardless and want to be informed so they can help guide goals of care with her.

## 2020-10-13 NOTE — Progress Notes (Signed)
RT to give nebulizer. Pt on bed pan.

## 2020-10-13 NOTE — Patient Instructions (Signed)
We have sent in bactrim to take 1 pill twice a day for 1 week.   We are checking the labs today. 

## 2020-10-14 DIAGNOSIS — N200 Calculus of kidney: Secondary | ICD-10-CM | POA: Diagnosis not present

## 2020-10-14 DIAGNOSIS — R2681 Unsteadiness on feet: Secondary | ICD-10-CM | POA: Diagnosis not present

## 2020-10-14 DIAGNOSIS — N2889 Other specified disorders of kidney and ureter: Secondary | ICD-10-CM | POA: Diagnosis not present

## 2020-10-14 DIAGNOSIS — E876 Hypokalemia: Secondary | ICD-10-CM | POA: Diagnosis present

## 2020-10-14 DIAGNOSIS — Z7189 Other specified counseling: Secondary | ICD-10-CM | POA: Diagnosis not present

## 2020-10-14 DIAGNOSIS — Z515 Encounter for palliative care: Secondary | ICD-10-CM

## 2020-10-14 DIAGNOSIS — Z9841 Cataract extraction status, right eye: Secondary | ICD-10-CM | POA: Diagnosis not present

## 2020-10-14 DIAGNOSIS — N390 Urinary tract infection, site not specified: Secondary | ICD-10-CM

## 2020-10-14 DIAGNOSIS — Z9842 Cataract extraction status, left eye: Secondary | ICD-10-CM | POA: Diagnosis not present

## 2020-10-14 DIAGNOSIS — E43 Unspecified severe protein-calorie malnutrition: Secondary | ICD-10-CM | POA: Diagnosis present

## 2020-10-14 DIAGNOSIS — K5730 Diverticulosis of large intestine without perforation or abscess without bleeding: Secondary | ICD-10-CM | POA: Diagnosis present

## 2020-10-14 DIAGNOSIS — Z888 Allergy status to other drugs, medicaments and biological substances status: Secondary | ICD-10-CM | POA: Diagnosis not present

## 2020-10-14 DIAGNOSIS — I2510 Atherosclerotic heart disease of native coronary artery without angina pectoris: Secondary | ICD-10-CM | POA: Diagnosis not present

## 2020-10-14 DIAGNOSIS — E785 Hyperlipidemia, unspecified: Secondary | ICD-10-CM | POA: Diagnosis present

## 2020-10-14 DIAGNOSIS — Z8719 Personal history of other diseases of the digestive system: Secondary | ICD-10-CM | POA: Diagnosis not present

## 2020-10-14 DIAGNOSIS — M5410 Radiculopathy, site unspecified: Secondary | ICD-10-CM | POA: Diagnosis present

## 2020-10-14 DIAGNOSIS — R279 Unspecified lack of coordination: Secondary | ICD-10-CM | POA: Diagnosis not present

## 2020-10-14 DIAGNOSIS — Z20822 Contact with and (suspected) exposure to covid-19: Secondary | ICD-10-CM | POA: Diagnosis present

## 2020-10-14 DIAGNOSIS — Z87891 Personal history of nicotine dependence: Secondary | ICD-10-CM | POA: Diagnosis not present

## 2020-10-14 DIAGNOSIS — J310 Chronic rhinitis: Secondary | ICD-10-CM | POA: Diagnosis present

## 2020-10-14 DIAGNOSIS — N179 Acute kidney failure, unspecified: Secondary | ICD-10-CM | POA: Diagnosis present

## 2020-10-14 DIAGNOSIS — Z8261 Family history of arthritis: Secondary | ICD-10-CM | POA: Diagnosis not present

## 2020-10-14 DIAGNOSIS — R531 Weakness: Secondary | ICD-10-CM | POA: Diagnosis not present

## 2020-10-14 DIAGNOSIS — Z90710 Acquired absence of both cervix and uterus: Secondary | ICD-10-CM | POA: Diagnosis not present

## 2020-10-14 DIAGNOSIS — N136 Pyonephrosis: Secondary | ICD-10-CM | POA: Diagnosis present

## 2020-10-14 DIAGNOSIS — I10 Essential (primary) hypertension: Secondary | ICD-10-CM | POA: Diagnosis present

## 2020-10-14 DIAGNOSIS — J449 Chronic obstructive pulmonary disease, unspecified: Secondary | ICD-10-CM | POA: Diagnosis present

## 2020-10-14 DIAGNOSIS — F0390 Unspecified dementia without behavioral disturbance: Secondary | ICD-10-CM | POA: Diagnosis not present

## 2020-10-14 DIAGNOSIS — M6281 Muscle weakness (generalized): Secondary | ICD-10-CM | POA: Diagnosis not present

## 2020-10-14 DIAGNOSIS — N10 Acute tubulo-interstitial nephritis: Secondary | ICD-10-CM | POA: Diagnosis not present

## 2020-10-14 DIAGNOSIS — Z66 Do not resuscitate: Secondary | ICD-10-CM | POA: Diagnosis present

## 2020-10-14 DIAGNOSIS — Z96642 Presence of left artificial hip joint: Secondary | ICD-10-CM | POA: Diagnosis present

## 2020-10-14 DIAGNOSIS — E875 Hyperkalemia: Secondary | ICD-10-CM | POA: Diagnosis present

## 2020-10-14 DIAGNOSIS — Z87440 Personal history of urinary (tract) infections: Secondary | ICD-10-CM | POA: Diagnosis not present

## 2020-10-14 DIAGNOSIS — J309 Allergic rhinitis, unspecified: Secondary | ICD-10-CM | POA: Diagnosis not present

## 2020-10-14 DIAGNOSIS — Z23 Encounter for immunization: Secondary | ICD-10-CM | POA: Diagnosis not present

## 2020-10-14 DIAGNOSIS — H9193 Unspecified hearing loss, bilateral: Secondary | ICD-10-CM | POA: Diagnosis not present

## 2020-10-14 DIAGNOSIS — Z885 Allergy status to narcotic agent status: Secondary | ICD-10-CM | POA: Diagnosis not present

## 2020-10-14 LAB — COMPREHENSIVE METABOLIC PANEL
ALT: 14 U/L (ref 0–44)
AST: 18 U/L (ref 15–41)
Albumin: 2.6 g/dL — ABNORMAL LOW (ref 3.5–5.0)
Alkaline Phosphatase: 79 U/L (ref 38–126)
Anion gap: 7 (ref 5–15)
BUN: 78 mg/dL — ABNORMAL HIGH (ref 8–23)
CO2: 20 mmol/L — ABNORMAL LOW (ref 22–32)
Calcium: 9.5 mg/dL (ref 8.9–10.3)
Chloride: 106 mmol/L (ref 98–111)
Creatinine, Ser: 3.26 mg/dL — ABNORMAL HIGH (ref 0.44–1.00)
GFR, Estimated: 13 mL/min — ABNORMAL LOW (ref >60)
Glucose, Bld: 96 mg/dL (ref 70–99)
Potassium: 6.4 mmol/L (ref 3.5–5.1)
Sodium: 133 mmol/L — ABNORMAL LOW (ref 135–145)
Total Bilirubin: 0.8 mg/dL (ref 0.3–1.2)
Total Protein: 6.4 g/dL — ABNORMAL LOW (ref 6.5–8.1)

## 2020-10-14 LAB — URINE CULTURE

## 2020-10-14 LAB — BASIC METABOLIC PANEL
Anion gap: 6 (ref 5–15)
BUN: 69 mg/dL — ABNORMAL HIGH (ref 8–23)
CO2: 19 mmol/L — ABNORMAL LOW (ref 22–32)
Calcium: 9.1 mg/dL (ref 8.9–10.3)
Chloride: 107 mmol/L (ref 98–111)
Creatinine, Ser: 3.02 mg/dL — ABNORMAL HIGH (ref 0.44–1.00)
GFR, Estimated: 14 mL/min — ABNORMAL LOW (ref >60)
Glucose, Bld: 115 mg/dL — ABNORMAL HIGH (ref 70–99)
Potassium: 6 mmol/L — ABNORMAL HIGH (ref 3.5–5.1)
Sodium: 132 mmol/L — ABNORMAL LOW (ref 135–145)

## 2020-10-14 LAB — CBC
HCT: 36.9 % (ref 36.0–46.0)
Hemoglobin: 11.8 g/dL — ABNORMAL LOW (ref 12.0–15.0)
MCH: 30.6 pg (ref 26.0–34.0)
MCHC: 32 g/dL (ref 30.0–36.0)
MCV: 95.8 fL (ref 80.0–100.0)
Platelets: 308 10*3/uL (ref 150–400)
RBC: 3.85 MIL/uL — ABNORMAL LOW (ref 3.87–5.11)
RDW: 14.1 % (ref 11.5–15.5)
WBC: 11.2 10*3/uL — ABNORMAL HIGH (ref 4.0–10.5)
nRBC: 0 % (ref 0.0–0.2)

## 2020-10-14 LAB — POTASSIUM: Potassium: 4.7 mmol/L (ref 3.5–5.1)

## 2020-10-14 MED ORDER — NEPRO/CARBSTEADY PO LIQD
237.0000 mL | Freq: Two times a day (BID) | ORAL | Status: DC
  Administered 2020-10-14: 237 mL via ORAL

## 2020-10-14 MED ORDER — ADULT MULTIVITAMIN W/MINERALS CH
1.0000 | ORAL_TABLET | Freq: Every day | ORAL | Status: DC
  Administered 2020-10-14 – 2020-10-16 (×3): 1 via ORAL
  Filled 2020-10-14 (×3): qty 1

## 2020-10-14 MED ORDER — ALBUTEROL SULFATE HFA 108 (90 BASE) MCG/ACT IN AERS
2.0000 | INHALATION_SPRAY | Freq: Four times a day (QID) | RESPIRATORY_TRACT | Status: DC | PRN
  Filled 2020-10-14: qty 6.7

## 2020-10-14 MED ORDER — FLUTICASONE FUROATE-VILANTEROL 100-25 MCG/INH IN AEPB
1.0000 | INHALATION_SPRAY | Freq: Two times a day (BID) | RESPIRATORY_TRACT | Status: DC
  Administered 2020-10-14: 1 via RESPIRATORY_TRACT
  Filled 2020-10-14: qty 28

## 2020-10-14 MED ORDER — MUPIROCIN 2 % EX OINT
TOPICAL_OINTMENT | Freq: Two times a day (BID) | CUTANEOUS | Status: DC
  Administered 2020-10-14: 1 via TOPICAL
  Filled 2020-10-14 (×4): qty 22

## 2020-10-14 MED ORDER — AMLODIPINE BESYLATE 10 MG PO TABS
10.0000 mg | ORAL_TABLET | Freq: Every day | ORAL | Status: DC
  Administered 2020-10-14 – 2020-10-16 (×3): 10 mg via ORAL
  Filled 2020-10-14 (×3): qty 1

## 2020-10-14 MED ORDER — ENSURE ENLIVE PO LIQD
237.0000 mL | Freq: Three times a day (TID) | ORAL | Status: DC
  Administered 2020-10-14 – 2020-10-16 (×4): 237 mL via ORAL

## 2020-10-14 MED ORDER — FLUTICASONE FUROATE-VILANTEROL 100-25 MCG/INH IN AEPB
1.0000 | INHALATION_SPRAY | Freq: Every day | RESPIRATORY_TRACT | Status: DC
  Administered 2020-10-15 – 2020-10-16 (×2): 1 via RESPIRATORY_TRACT
  Filled 2020-10-14: qty 28

## 2020-10-14 MED ORDER — ASPIRIN EC 81 MG PO TBEC
81.0000 mg | DELAYED_RELEASE_TABLET | Freq: Every day | ORAL | Status: DC
  Administered 2020-10-14 – 2020-10-16 (×3): 81 mg via ORAL
  Filled 2020-10-14 (×3): qty 1

## 2020-10-14 MED ORDER — CYCLOSPORINE 0.05 % OP EMUL
1.0000 [drp] | Freq: Two times a day (BID) | OPHTHALMIC | Status: DC
  Administered 2020-10-14 – 2020-10-15 (×4): 1 [drp] via OPHTHALMIC
  Filled 2020-10-14 (×7): qty 1

## 2020-10-14 MED ORDER — SODIUM ZIRCONIUM CYCLOSILICATE 10 G PO PACK
10.0000 g | PACK | Freq: Once | ORAL | Status: AC
  Administered 2020-10-14: 10 g via ORAL
  Filled 2020-10-14: qty 1

## 2020-10-14 NOTE — Progress Notes (Signed)
Approved by nursing director for patient's daughter Rosann Auerbach to stay the night with patient to help with confusion/agitation.

## 2020-10-14 NOTE — Plan of Care (Signed)

## 2020-10-14 NOTE — Progress Notes (Signed)
Patient admitted to the unit from ED. Accompanied by ED RN. Patient transferred to the bed but was uncomfortable. Patient sleeps in a recliner at home. Patient assisted to the recliner, chair alarm initiated. Daughter, Rosann Auerbach at bedside. Able to help answer admission intake questions. Patient is forgetful and tearful. Daughter providing reassurance. Wound noted to right great toe. Dr. Olevia Bowens at bedside. Wound care consult order implemented. Patient and daughter agreeable with purewick. All questions answered and plan of care discussed.

## 2020-10-14 NOTE — Progress Notes (Signed)
Notified by central tele of ST elevation in some leads. MD notified and stat EKG obtained. Hard copy EKG in chart, MD aware. Pt had no chest pain or symptoms of any kind throughout. VSS. Will continue to monitor.

## 2020-10-14 NOTE — ED Notes (Signed)
This RN went in to take patient upstairs to 3E. Per 3E charge nurse pt daughter is not allowed to stay with pt overnight. Pt daughter questioning this RN about where pt is going. This RN informed pt daughter that she was going to [place]. Pt daughter upset that no one has dicussed plan of care with them and that plan of care should be palliative in nature. Pt daughter informed this RN that pt was DNR status but she did not have papers with her and that the PA earlier told her that was fine. Pt daughter requesting to speak to doctor. This RN paged Dr. Olevia Bowens. Dr. Olevia Bowens called this RN back and is on phone with daughter at this moment.

## 2020-10-14 NOTE — Progress Notes (Signed)
PROGRESS NOTE    [name] [facility]  [id number] DOB: 1927/01/29 DOA: 10/13/2020 PCP: Hoyt Koch, MD     Brief Narrative:  [name] is a 87 y.o. female with medical history significant of allergy rhinitis, COPD, reactive airway disease, colon polyps, diverticulosis, hyperlipidemia, hypertension, hypokalemia, unspecified radiculopathy who was sent to the emergency department by her primary care physician due to abnormal labs showing AKI and hyperkalemia.  The patient's daughter stated that she has been treated twice with oral antibiotics over the last month for UTI without significant results.  The patient has been having decreased oral intake for the past few weeks, she has been sleeping more than usual and her mentation seems to be decreased particularly since Saturday.  CT renal study showed numerous bilateral kidney stones including a 22 mm right renal pelvic stone with mild upper pole hydronephrosis.  There is a slight interval increase in size of solid-appearing mass lesion on the lower pole of the left kidney.  Patient will delete that patient was admitted with AKI, hyperkalemia and UTI.  She was started on IV fluid, Lokelma, Rocephin.  New events last 24 hours / Subjective: Patient sitting in recliner on my examination.  She has no physical complaints, denies any decrease in p.o. intake, nausea, vomiting, pain.  Son at bedside denies this, states that her oral intake has been decreased greatly.  Assessment & Plan:   Principal Problem:   AKI (acute kidney injury) ([place]) Active Problems:   Hyperlipidemia   Essential hypertension   COPD, mild (HCC)   Hyperkalemia   Left kidney mass   Severe protein-calorie malnutrition (HCC)   Acute lower UTI   AKI -In combination with decreased oral intake, Bactrim use, partially obstructing component with large renal stone, renal mass -Continue IV fluid -Avoid nephrotoxins -Trend BMP  UTI, POA  -Urine culture  pending -Continue IV Rocephin  Hyperkalemia -Give another dose of Lokelma -Repeat potassium level this afternoon  Hypertension -Continue amlodipine  Left renal mass -Family declining to pursue any surgical or invasive intervention at this time  Right renal stone -Appreciate urology, due to lack of high-grade obstruction involving the right kidney, ureteral stent would be of little utility, otherwise would require PCNL or multiple staged ureteroscopies.  Family opposed to invasive surgical therapy.  Follow-up as outpatient for now  Goals of care -Discussed with son and daughter regarding recommendation for palliative care consultation.  They could also provide outpatient services, transition to hospice if patient further declines as an outpatient.  They were agreeable to visit with palliative care team inpatient    DVT prophylaxis:  enoxaparin (LOVENOX) injection 30 mg Start: 10/14/20 1000  Code Status:     Code Status Orders  (From admission, onward)         Start     Ordered   10/14/20 0034  Do not attempt resuscitation (DNR)  Continuous       Question Answer Comment  In the event of cardiac or respiratory ARREST Do not call a "code blue"   In the event of cardiac or respiratory ARREST Do not perform Intubation, CPR, defibrillation or ACLS   In the event of cardiac or respiratory ARREST Use medication by any route, position, wound care, and other measures to relive pain and suffering. May use oxygen, suction and manual treatment of airway obstruction as needed for comfort.      10/14/20 0034        Code Status History    Date Active  Date Inactive Code Status Order ID Comments User Context   10/13/2020 2244 10/14/2020 0034 Full Code [id number]  Reubin Milan, MD ED   Advance Care Planning Activity    Advance Directive Documentation   Flowsheet Row Most Recent Value  Type of Advance Directive Healthcare Power of Attorney, Living will  Pre-existing out of facility  DNR order (yellow form or pink MOST form) --  "MOST" Form in Place? --     Family Communication: Son at bedside, daughter over the phone  Disposition Plan:  Status is: Observation  The patient will require care spanning > 2 midnights and should be moved to inpatient because: IV treatments appropriate due to intensity of illness or inability to take PO  Dispo: The patient is from: Home              Anticipated d/c is to: Home              Patient currently is not medically stable to d/c.   Difficult to place patient No      Consultants:   Urology  Palliative care medicine   Antimicrobials:  Anti-infectives (From admission, onward)   Start     Dose/Rate Route Frequency Ordered Stop   10/14/20 2000  cefTRIAXone (ROCEPHIN) 1 g in sodium chloride 0.9 % 100 mL IVPB        1 g 200 mL/hr over 30 Minutes Intravenous Every 24 hours 10/13/20 2252     10/13/20 2000  cefTRIAXone (ROCEPHIN) 1 g in sodium chloride 0.9 % 100 mL IVPB        1 g 200 mL/hr over 30 Minutes Intravenous  Once 10/13/20 1958 10/14/20 0000        Objective: Vitals:   10/14/20 0746 10/14/20 0824 10/14/20 0845 10/14/20 1126  BP: 112/85  111/82 (!) 119/54  Pulse:   88 90  Resp: (!) 21  16 20   Temp:   97.8 F (36.6 C) 98.4 F (36.9 C)  TempSrc:   Oral Oral  SpO2: 96% 97% 97% 98%  Weight:        Intake/Output Summary (Last 24 hours) at 10/14/2020 1338 Last data filed at 10/14/2020 1250 Gross per 24 hour  Intake 110 ml  Output 525 ml  Net -415 ml   Filed Weights   10/14/20 0105  Weight: 49.6 kg    Examination:  General exam: Appears calm and comfortable  Respiratory system: Clear to auscultation. Respiratory effort normal. No respiratory distress. No conversational dyspnea.  Cardiovascular system: S1 & S2 heard, RRR. No murmurs. No pedal edema. Gastrointestinal system: Abdomen is nondistended, soft and nontender. Normal bowel sounds heard. Central nervous system: Alert and oriented. No focal  neurological deficits. Speech clear.  Extremities: Symmetric in appearance  Skin: No rashes, lesions or ulcers on exposed skin  Psychiatry: Judgement and insight appear normal. Mood & affect appropriate.   Data Reviewed: I have personally reviewed following labs and imaging studies  CBC: Recent Labs  Lab 10/13/20 1029 10/13/20 1542 10/13/20 2058 10/14/20 0501  WBC 10.5 12.2*  --  11.2*  NEUTROABS  --  8.2*  --   --   HGB 13.3 13.4 12.9 11.8*  HCT 40.2 41.6 38.0 36.9  MCV 92.9 95.2  --  95.8  PLT 425.0* 424*  --  175   Basic Metabolic Panel: Recent Labs  Lab 10/13/20 1029 10/13/20 1942 10/13/20 2058 10/14/20 0501  NA 131* 133* 133* 132*  K 6.5* 6.4* 6.3* 6.0*  CL 101 106  112* 107  CO2 23 20*  --  19*  GLUCOSE 146* 96 94 115*  BUN 82* 78* 71* 69*  CREATININE 3.33* 3.26* 3.50* 3.02*  CALCIUM 9.9 9.5  --  9.1   GFR: Estimated Creatinine Clearance: 9.5 mL/min (A) (by C-G formula based on SCr of 3.02 mg/dL (H)). Liver Function Tests: Recent Labs  Lab 10/13/20 1029 10/13/20 1942  AST 18 18  ALT 11 14  ALKPHOS 92 79  BILITOT 0.4 0.8  PROT 6.9 6.4*  ALBUMIN 3.2* 2.6*   No results for input(s): LIPASE, AMYLASE in the last 168 hours. No results for input(s): AMMONIA in the last 168 hours. Coagulation Profile: No results for input(s): INR, PROTIME in the last 168 hours. Cardiac Enzymes: No results for input(s): CKTOTAL, CKMB, CKMBINDEX, TROPONINI in the last 168 hours. BNP (last 3 results) No results for input(s): PROBNP in the last 8760 hours. HbA1C: No results for input(s): HGBA1C in the last 72 hours. CBG: No results for input(s): GLUCAP in the last 168 hours. Lipid Profile: No results for input(s): CHOL, HDL, LDLCALC, TRIG, CHOLHDL, LDLDIRECT in the last 72 hours. Thyroid Function Tests: No results for input(s): TSH, T4TOTAL, FREET4, T3FREE, THYROIDAB in the last 72 hours. Anemia Panel: No results for input(s): VITAMINB12, FOLATE, FERRITIN, TIBC, IRON,  RETICCTPCT in the last 72 hours. Sepsis Labs: Recent Labs  Lab 10/13/20 2055 10/13/20 2159  LATICACIDVEN 1.2 1.2    Recent Results (from the past 240 hour(s))  Resp Panel by RT-PCR (Flu A&B, Covid) Nasopharyngeal Swab     Status: None   Collection Time: 10/13/20  3:42 PM   Specimen: Nasopharyngeal Swab; Nasopharyngeal(NP) swabs in vial transport medium  Result Value Ref Range Status   SARS Coronavirus 2 by RT PCR NEGATIVE NEGATIVE Final    Comment: (NOTE) SARS-CoV-2 target nucleic acids are NOT DETECTED.  The SARS-CoV-2 RNA is generally detectable in upper respiratory specimens during the acute phase of infection. The lowest concentration of SARS-CoV-2 viral copies this assay can detect is 138 copies/mL. A negative result does not preclude SARS-Cov-2 infection and should not be used as the sole basis for treatment or other patient management decisions. A negative result may occur with  improper specimen collection/handling, submission of specimen other than nasopharyngeal swab, presence of viral mutation(s) within the areas targeted by this assay, and inadequate number of viral copies(<138 copies/mL). A negative result must be combined with clinical observations, patient history, and epidemiological information. The expected result is Negative.  Fact Sheet for Patients:  EntrepreneurPulse.com.au  Fact Sheet for Healthcare Providers:  IncredibleEmployment.be  This test is no t yet approved or cleared by the [place] FDA and  has been authorized for detection and/or diagnosis of SARS-CoV-2 by FDA under an Emergency Use Authorization (EUA). This EUA will remain  in effect (meaning this test can be used) for the duration of the COVID-19 declaration under Section 564(b)(1) of the Act, 21 U.S.C.section 360bbb-3(b)(1), unless the authorization is terminated  or revoked sooner.       Influenza A by PCR NEGATIVE NEGATIVE Final   Influenza  B by PCR NEGATIVE NEGATIVE Final    Comment: (NOTE) The Xpert Xpress SARS-CoV-2/FLU/RSV plus assay is intended as an aid in the diagnosis of influenza from Nasopharyngeal swab specimens and should not be used as a sole basis for treatment. Nasal washings and aspirates are unacceptable for Xpert Xpress SARS-CoV-2/FLU/RSV testing.  Fact Sheet for Patients: EntrepreneurPulse.com.au  Fact Sheet for Healthcare Providers: IncredibleEmployment.be  This test is  not yet approved or cleared by the [place] and has been authorized for detection and/or diagnosis of SARS-CoV-2 by FDA under an Emergency Use Authorization (EUA). This EUA will remain in effect (meaning this test can be used) for the duration of the COVID-19 declaration under Section 564(b)(1) of the Act, 21 U.S.C. section 360bbb-3(b)(1), unless the authorization is terminated or revoked.  Performed at [facility], [place] [address], [place], [place] [address]   Culture, blood (routine x 2)     Status: None (Preliminary result)   Collection Time: 10/13/20  8:43 PM   Specimen: BLOOD RIGHT HAND  Result Value Ref Range Status   Specimen Description BLOOD RIGHT HAND  Final   Special Requests   Final    BOTTLES DRAWN AEROBIC AND ANAEROBIC Blood Culture results may not be optimal due to an inadequate volume of blood received in culture bottles   Culture   Final    NO GROWTH < 12 HOURS Performed at [facility], [place] [address], [place], [place] [address]    Report Status PENDING  Incomplete  Culture, blood (routine x 2)     Status: None (Preliminary result)   Collection Time: 10/13/20  8:43 PM   Specimen: BLOOD LEFT FOREARM  Result Value Ref Range Status   Specimen Description BLOOD LEFT FOREARM  Final   Special Requests   Final    BOTTLES DRAWN AEROBIC AND ANAEROBIC Blood Culture results may not be optimal due to an inadequate volume of blood received in culture bottles    Culture   Final    NO GROWTH < 12 HOURS Performed at [facility], [place] [address], [place], [place] [address]    Report Status PENDING  Incomplete      Radiology Studies: CT Renal Stone Study  Result Date: 10/13/2020 CLINICAL DATA:  Hematuria renal parenchymal disease EXAM: CT ABDOMEN AND PELVIS WITHOUT CONTRAST TECHNIQUE: Multidetector CT imaging of the abdomen and pelvis was performed following the standard protocol without IV contrast. COMPARISON:  CT 04/26/2019 FINDINGS: Lower chest: Lung bases demonstrate no acute consolidation or effusion. Mild cardiomegaly. Coronary calcifications. Hepatobiliary: No calcified gallstone. No focal hepatic abnormality or biliary dilatation. Pancreas: Unremarkable. No pancreatic ductal dilatation or surrounding inflammatory changes. Spleen: Normal in size without focal abnormality. Adrenals/Urinary Tract: Adrenal glands are within normal limits. 22 mm stone in the right renal pelvis with mild upper pole hydronephrosis. Numerous stones within the lower pole collecting system on the right. Multiple left-sided kidney stones, the largest is also seen in the left renal pelvis and measures 8 mm. 9 mm hyperdense nodule upper pole right kidney, slightly more apparent. 14 mm hyperdense exophytic nodule midpole right kidney without change. Cysts within the bilateral kidneys. Solid appearing mass exophytic to the lower pole left kidney measures 4.2 x 3.6 cm by 5.9 cm, previously 3.9 x 3.4 x 5.5 cm. Slightly thick-walled urinary bladder Stomach/Bowel: Stomach nonenlarged. No dilated small bowel. No acute bowel wall thickening. Diverticular disease of the left colon without acute inflammatory process. Vascular/Lymphatic: Moderate aortic atherosclerosis. No aneurysm. No increasing adenopathy. Stable 11 mm oval nodule adjacent to the left hepatic lobe. Reproductive: Status post hysterectomy. No adnexal masses. Other: Negative for free air or free fluid. Musculoskeletal: Left hip  replacement with artifact. Scoliosis of the spine. 9 mm anterolisthesis L4 on L5. Advanced degenerative changes throughout the lumbar spine. IMPRESSION: 1. Numerous bilateral kidney stones, including a 22 mm right renal pelvic stone with mild upper pole  hydronephrosis. 2. Slight interval increase in size of solid appearing mass lesion involving lower pole left kidney, now measuring up to 5.9 cm and suspicious for carcinoma. 3. Slightly thick-walled urinary bladder is questionable for mild cystitis 4. Extensive left colon diverticular disease without acute inflammation Electronically Signed   By: Donavan Foil M.D.   On: 10/13/2020 21:52      Scheduled Meds: . amLODipine  10 mg Oral Daily  . aspirin EC  81 mg Oral Daily  . cycloSPORINE  1 drop Both Eyes BID  . enoxaparin (LOVENOX) injection  30 mg Subcutaneous Q24H  . feeding supplement (NEPRO CARB STEADY)  237 mL Oral BID BM  . [START ON 10/15/2020] fluticasone furoate-vilanterol  1 puff Inhalation Daily  . mupirocin ointment   Topical BID   Continuous Infusions: . cefTRIAXone (ROCEPHIN)  IV    . lactated ringers 125 mL/hr at 10/14/20 0605     LOS: 0 days      Time spent: 25 minutes   Dessa Phi, DO [facility] 10/14/2020, 1:38 PM   Available via Epic secure chat 7am-7pm After these hours, please refer to coverage provider listed on [email or web address]

## 2020-10-14 NOTE — Progress Notes (Signed)
Initial Nutrition Assessment  DOCUMENTATION CODES:   Underweight,Severe malnutrition in context of chronic illness  INTERVENTION:   -MVI with minerals daily -Magic cup TID with meals, each supplement provides 290 kcal and 9 grams of protein -Ensure Enlive po TID, each supplement provides 350 kcal and 20 grams of protein -D/c Nepro Shake po BID, each supplement provides 425 kcal and 19 grams protein -Liberalize diet ot regular  NUTRITION DIAGNOSIS:   Severe Malnutrition related to chronic illness (COPD) as evidenced by energy intake < or equal to 75% for > or equal to 1 month,moderate fat depletion,severe fat depletion,moderate muscle depletion,severe muscle depletion.  GOAL:   Patient will meet greater than or equal to 90% of their needs  MONITOR:   PO intake,Supplement acceptance,Labs,Weight trends,Skin,I & O's  REASON FOR ASSESSMENT:   Malnutrition Screening Tool    ASSESSMENT:   [name] is a 87 y.o. female with medical history significant of allergy rhinitis, COPD, reactive airway disease, colon polyps, diverticulosis, hyperlipidemia, hypertension, hypokalemia, unspecified radiculopathy who was sent to the emergency department by her physician due to abnormal labs showing AKI and hyperkalemia  Pt admitted with AKI and acute lower UTI.  Reviewed I/O's: -90 ml x 24 hours  UOP: 150 ml x 24 hours  Spoke with pt and son at bedside. Per son, pt has been experiencing a general decline in health over the past month. Pt has been consuming 1-2 meals per day, often whatever pt is willing to eat. Pt is a selective eater and often eats the same foods over and over again. Per son, she is even tiring of favorite foods and has been consuming mostly milkshakes over the past few weeks.  Noted meal completion 15%. Pt has not touched her lunch tray yet and she consumed about 25% of Nepro supplement.   Per son, pt UBW is around 125#. He estimates she has lost about 27 pounds over  the past 6 months. Reviewed wt hx; pt has experienced a 14.8% wt loss over the past year.   RD affirmed pt son of liberalized diet. He is amenable to try Ensure Enlive supplements for pt. Case discussed with MD, who gave RD permission to liberalize diet.   Medications reviewed and include lactated ringers @ 125 ml/hr.   Noted palliative care consult pending for goals of care.   Labs reviewed: Na: 132, K: 6.0.  NUTRITION - FOCUSED PHYSICAL EXAM:  Flowsheet Row Most Recent Value  Orbital Region Moderate depletion  Upper Arm Region Severe depletion  Thoracic and Lumbar Region Moderate depletion  Buccal Region Severe depletion  Temple Region Severe depletion  Clavicle Bone Region Severe depletion  Clavicle and Acromion Bone Region Severe depletion  Scapular Bone Region Severe depletion  Dorsal Hand Moderate depletion  Patellar Region Severe depletion  Anterior Thigh Region Severe depletion  Posterior Calf Region Severe depletion  Edema (RD Assessment) None  Hair Reviewed  Eyes Reviewed  Mouth Reviewed  Skin Reviewed  Nails Reviewed       Diet Order:   Diet Order            Diet regular Room service appropriate? Yes with Assist; Fluid consistency: Thin  Diet effective now                 EDUCATION NEEDS:   Education needs have been addressed  Skin:  Skin Assessment: Skin Integrity Issues: Skin Integrity Issues:: Other (Comment) Other: wound on rt great toe  Last BM:  10/13/20  Height:  Ht Readings from Last 1 Encounters:  10/13/20 5\' 4"  (1.626 m)    Weight:   Wt Readings from Last 1 Encounters:  10/14/20 49.6 kg    Ideal Body Weight:  54.5 kg  BMI:  Body mass index is 18.77 kg/m.  Estimated Nutritional Needs:   Kcal:  0086-7619  Protein:  85-100 grams  Fluid:  > 1.7 L    Loistine Chance, RD, LDN, [place] Registered Dietitian II Certified Diabetes Care and Education Specialist Please refer to [facility] for RD and/or RD on-call/weekend/after hours  pager

## 2020-10-14 NOTE — ED Notes (Signed)
Per Dr. Olevia Bowens - he confirmed via phone call with daughter that pt is DNR. Pt is still okay to go up to 3E and daughter is to stay with pt.

## 2020-10-14 NOTE — Consult Note (Signed)
Urology Consult   Physician requesting consult: Tomasa Rand, MD  Reason for consult: Right renal stone  History of Present Illness: [name] is a 87 y.o. female who presented to the ED on 10/13/20 with general malaise, poor PO intake and purulent urine along with elevated serum creatinine and hyperkalemia.  She has been dealing with recurrent UTIs over the past 2-3 weeks that have not resolved despite multiple empiric courses of antibiotics, per the patient's daughter at beside.  No urine culture data is available.  CT abdomen and pelvis from yesterday revealed a 2 cm right renal stone along with a solid 5.9 cm left renal mass with features concerning for RCC.  The mass measured approximately 5.4 cm on cross-sectional imaging from 04/26/2019.  The patient and her daughter clearly state that they do not want to pursue any surgical intervention for her left renal mass or the large right renal stone.  From a urinary standpoint, the patient states that she has good bladder control and denies any issues with incontinence.  She admits that she occasionally will have episodes of stool incontinence.  She denies any prior issues with recurrent urinary tract infections.   Past Medical History:  Diagnosis Date  . ALLERGIC RHINITIS 05/29/2007  . CHRONIC OBSTRUCTIVE PULMONARY DISEASE, ACUTE EXACERBATION 11/21/2007  . COLONIC POLYPS, HX OF 11/21/2007  . COPD 11/21/2007  . DIVERTICULOSIS, COLON 11/21/2007  . HIP PAIN, LEFT, CHRONIC 06/01/2010  . HYPERLIPIDEMIA 05/29/2007  . HYPERTENSION 05/29/2007  . Hypopotassemia 06/12/2007  . HYSTERECTOMY, HX OF 05/29/2007  . Other acquired absence of organ 05/29/2007  . Other postprocedural status(V45.89) 05/29/2007  . RADICULOPATHY 05/29/2007  . REACTIVE AIRWAY DISEASE 05/29/2007    Past Surgical History:  Procedure Laterality Date  . ABDOMINAL HYSTERECTOMY    . APPENDECTOMY    . CATARACT EXTRACTION Bilateral 2016   Dr. Katy Fitch  . CATARACT EXTRACTION W/  INTRAOCULAR LENS IMPLANT Bilateral 05/2015   around december 9th and 28   . DILATION AND CURETTAGE OF UTERUS    . LIPOMA EXCISION    . LUMBAR LAMINECTOMY    . PARTIAL HIP ARTHROPLASTY Left 2012  . TONSILLECTOMY      Current Hospital Medications:  Home Meds:  Current Meds  Medication Sig  . amLODipine (NORVASC) 10 MG tablet Take 1 tablet (10 mg total) by mouth daily.  Marland Kitchen aspirin EC 81 MG tablet Take 81 mg by mouth daily. Swallow whole.  . fluticasone furoate-vilanterol (BREO ELLIPTA) 100-25 MCG/INH AEPB Inhale 1 puff into the lungs 2 (two) times daily.  . Multiple Vitamins-Minerals (CENTRUM SILVER) tablet Take 1 tablet by mouth daily.  . naproxen sodium (ANAPROX) 220 MG tablet Take 220 mg by mouth daily.  Marland Kitchen PROAIR HFA 108 (90 Base) MCG/ACT inhaler INHALE 2 PUFFS INTO THE LUNGS EVERY 6 HOURS AS NEEDED FOR WHEEZING ORSHORTNESS OF BREATH (Patient taking differently: Inhale 2 puffs into the lungs every 6 (six) hours as needed for wheezing or shortness of breath.)  . sulfamethoxazole-trimethoprim (BACTRIM DS) 800-160 MG tablet Take 1 tablet by mouth 2 (two) times daily.    Scheduled Meds: . amLODipine  10 mg Oral Daily  . aspirin EC  81 mg Oral Daily  . cycloSPORINE  1 drop Both Eyes BID  . enoxaparin (LOVENOX) injection  30 mg Subcutaneous Q24H  . feeding supplement (NEPRO CARB STEADY)  237 mL Oral BID BM  . fluticasone furoate-vilanterol  1 puff Inhalation BID  . mupirocin ointment   Topical BID   Continuous Infusions: .  cefTRIAXone (ROCEPHIN)  IV    . lactated ringers 125 mL/hr at 10/14/20 0605   PRN Meds:.acetaminophen **OR** acetaminophen, albuterol, ondansetron **OR** ondansetron (ZOFRAN) IV  Allergies:  Allergies  Allergen Reactions  . Prednisone     Patient became jittery all over  . Codeine     Family History  Problem Relation Age of Onset  . Arthritis Mother   . Heart disease Mother   . Uterine cancer Daughter   . Cancer Daughter        ovarian  . Heart disease  Father   . Diabetes Neg Hx   . COPD Neg Hx   . Stroke Neg Hx   . Kidney disease Neg Hx     Social History:  reports that she quit smoking about 40 years ago. She has never used smokeless tobacco. She reports that she does not drink alcohol and does not use drugs.  ROS: A complete review of systems was performed.  All systems are negative except for pertinent findings as noted.  Physical Exam:  Vital signs in last 24 hours: Temp:  [97.2 F (36.2 C)-98.5 F (36.9 C)] 97.8 F (36.6 C) (05/04 0845) Pulse Rate:  [61-96] 88 (05/04 0845) Resp:  [16-29] 16 (05/04 0845) BP: (101-128)/(45-85) 111/82 (05/04 0845) SpO2:  [94 %-99 %] 97 % (05/04 0845) Weight:  [49.6 kg] 49.6 kg (05/04 0105) Constitutional:  Alert and oriented, No acute distress Cardiovascular: Regular rate and rhythm, No JVD Respiratory: Normal respiratory effort, Lungs clear bilaterally GI: Abdomen is soft, nontender, nondistended, no abdominal masses GU: No CVA tenderness Lymphatic: No lymphadenopathy Neurologic: Grossly intact, no focal deficits Psychiatric: Normal mood and affect  Laboratory Data:  Recent Labs    10/13/20 1029 10/13/20 1542 10/13/20 2058 10/14/20 0501  WBC 10.5 12.2*  --  11.2*  HGB 13.3 13.4 12.9 11.8*  HCT 40.2 41.6 38.0 36.9  PLT 425.0* 424*  --  308    Recent Labs    10/13/20 1029 10/13/20 1942 10/13/20 2058 10/14/20 0501  NA 131* 133* 133* 132*  K 6.5* 6.4* 6.3* 6.0*  CL 101 106 112* 107  GLUCOSE 146* 96 94 115*  BUN 82* 78* 71* 69*  CALCIUM 9.9 9.5  --  9.1  CREATININE 3.33* 3.26* 3.50* 3.02*     Results for orders placed or performed during the hospital encounter of 10/13/20 (from the past 24 hour(s))  CBC with Differential     Status: Abnormal   Collection Time: 10/13/20  3:42 PM  Result Value Ref Range   WBC 12.2 (H) 4.0 - 10.5 K/uL   RBC 4.37 3.87 - 5.11 MIL/uL   Hemoglobin 13.4 12.0 - 15.0 g/dL   HCT 41.6 36.0 - 46.0 %   MCV 95.2 80.0 - 100.0 fL   MCH 30.7 26.0  - 34.0 pg   MCHC 32.2 30.0 - 36.0 g/dL   RDW 14.2 11.5 - 15.5 %   Platelets 424 (H) 150 - 400 K/uL   nRBC 0.0 0.0 - 0.2 %   Neutrophils Relative % 67 %   Neutro Abs 8.2 (H) 1.7 - 7.7 K/uL   Lymphocytes Relative 22 %   Lymphs Abs 2.7 0.7 - 4.0 K/uL   Monocytes Relative 8 %   Monocytes Absolute 0.9 0.1 - 1.0 K/uL   Eosinophils Relative 2 %   Eosinophils Absolute 0.3 0.0 - 0.5 K/uL   Basophils Relative 1 %   Basophils Absolute 0.1 0.0 - 0.1 K/uL   Immature Granulocytes 0 %  Abs Immature Granulocytes 0.04 0.00 - 0.07 K/uL  Resp Panel by RT-PCR (Flu A&B, Covid) Nasopharyngeal Swab     Status: None   Collection Time: 10/13/20  3:42 PM   Specimen: Nasopharyngeal Swab; Nasopharyngeal(NP) swabs in vial transport medium  Result Value Ref Range   SARS Coronavirus 2 by RT PCR NEGATIVE NEGATIVE   Influenza A by PCR NEGATIVE NEGATIVE   Influenza B by PCR NEGATIVE NEGATIVE  Urinalysis, Routine w reflex microscopic Urine, Clean Catch     Status: Abnormal   Collection Time: 10/13/20  3:53 PM  Result Value Ref Range   Color, Urine YELLOW YELLOW   APPearance TURBID (A) CLEAR   Specific Gravity, Urine 1.013 1.005 - 1.030   pH 5.0 5.0 - 8.0   Glucose, UA NEGATIVE NEGATIVE mg/dL   Hgb urine dipstick SMALL (A) NEGATIVE   Bilirubin Urine NEGATIVE NEGATIVE   Ketones, ur NEGATIVE NEGATIVE mg/dL   Protein, ur 100 (A) NEGATIVE mg/dL   Nitrite NEGATIVE NEGATIVE   Leukocytes,Ua MODERATE (A) NEGATIVE   RBC / HPF 6-10 0 - 5 RBC/hpf   WBC, UA >50 (H) 0 - 5 WBC/hpf   Bacteria, UA FEW (A) NONE SEEN   WBC Clumps PRESENT    Hyaline Casts, UA PRESENT   Comprehensive metabolic panel     Status: Abnormal   Collection Time: 10/13/20  7:42 PM  Result Value Ref Range   Sodium 133 (L) 135 - 145 mmol/L   Potassium 6.4 (HH) 3.5 - 5.1 mmol/L   Chloride 106 98 - 111 mmol/L   CO2 20 (L) 22 - 32 mmol/L   Glucose, Bld 96 70 - 99 mg/dL   BUN 78 (H) 8 - 23 mg/dL   Creatinine, Ser 3.26 (H) 0.44 - 1.00 mg/dL    Calcium 9.5 8.9 - 10.3 mg/dL   Total Protein 6.4 (L) 6.5 - 8.1 g/dL   Albumin 2.6 (L) 3.5 - 5.0 g/dL   AST 18 15 - 41 U/L   ALT 14 0 - 44 U/L   Alkaline Phosphatase 79 38 - 126 U/L   Total Bilirubin 0.8 0.3 - 1.2 mg/dL   GFR, Estimated 13 (L) >60 mL/min   Anion gap 7 5 - 15  Culture, blood (routine x 2)     Status: None (Preliminary result)   Collection Time: 10/13/20  8:43 PM   Specimen: BLOOD RIGHT HAND  Result Value Ref Range   Specimen Description BLOOD RIGHT HAND    Special Requests      BOTTLES DRAWN AEROBIC AND ANAEROBIC Blood Culture results may not be optimal due to an inadequate volume of blood received in culture bottles   Culture      NO GROWTH < 12 HOURS Performed at [facility], [address] [place], [place] [address]    Report Status PENDING   Culture, blood (routine x 2)     Status: None (Preliminary result)   Collection Time: 10/13/20  8:43 PM   Specimen: BLOOD LEFT FOREARM  Result Value Ref Range   Specimen Description BLOOD LEFT FOREARM    Special Requests      BOTTLES DRAWN AEROBIC AND ANAEROBIC Blood Culture results may not be optimal due to an inadequate volume of blood received in culture bottles   Culture      NO GROWTH < 12 HOURS Performed at [facility], [place] [address], [place], [place] [address]    Report Status PENDING   Lactic acid, plasma  Status: None   Collection Time: 10/13/20  8:55 PM  Result Value Ref Range   Lactic Acid, Venous 1.2 0.5 - 1.9 mmol/L  I-stat chem 8, ED (not at [facility] or [facility])     Status: Abnormal   Collection Time: 10/13/20  8:58 PM  Result Value Ref Range   Sodium 133 (L) 135 - 145 mmol/L   Potassium 6.3 (HH) 3.5 - 5.1 mmol/L   Chloride 112 (H) 98 - 111 mmol/L   BUN 71 (H) 8 - 23 mg/dL   Creatinine, Ser 3.50 (H) 0.44 - 1.00 mg/dL   Glucose, Bld 94 70 - 99 mg/dL   Calcium, Ion 1.15 1.15 - 1.40 mmol/L   TCO2 19 (L) 22 - 32 mmol/L   Hemoglobin 12.9 12.0 - 15.0 g/dL   HCT 38.0 36.0 - 46.0 %   Comment  NOTIFIED PHYSICIAN   Lactic acid, plasma     Status: None   Collection Time: 10/13/20  9:59 PM  Result Value Ref Range   Lactic Acid, Venous 1.2 0.5 - 1.9 mmol/L  CBC     Status: Abnormal   Collection Time: 10/14/20  5:01 AM  Result Value Ref Range   WBC 11.2 (H) 4.0 - 10.5 K/uL   RBC 3.85 (L) 3.87 - 5.11 MIL/uL   Hemoglobin 11.8 (L) 12.0 - 15.0 g/dL   HCT 36.9 36.0 - 46.0 %   MCV 95.8 80.0 - 100.0 fL   MCH 30.6 26.0 - 34.0 pg   MCHC 32.0 30.0 - 36.0 g/dL   RDW 14.1 11.5 - 15.5 %   Platelets 308 150 - 400 K/uL   nRBC 0.0 0.0 - 0.2 %  Basic metabolic panel     Status: Abnormal   Collection Time: 10/14/20  5:01 AM  Result Value Ref Range   Sodium 132 (L) 135 - 145 mmol/L   Potassium 6.0 (H) 3.5 - 5.1 mmol/L   Chloride 107 98 - 111 mmol/L   CO2 19 (L) 22 - 32 mmol/L   Glucose, Bld 115 (H) 70 - 99 mg/dL   BUN 69 (H) 8 - 23 mg/dL   Creatinine, Ser 3.02 (H) 0.44 - 1.00 mg/dL   Calcium 9.1 8.9 - 10.3 mg/dL   GFR, Estimated 14 (L) >60 mL/min   Anion gap 6 5 - 15   Recent Results (from the past 240 hour(s))  Resp Panel by RT-PCR (Flu A&B, Covid) Nasopharyngeal Swab     Status: None   Collection Time: 10/13/20  3:42 PM   Specimen: Nasopharyngeal Swab; Nasopharyngeal(NP) swabs in vial transport medium  Result Value Ref Range Status   SARS Coronavirus 2 by RT PCR NEGATIVE NEGATIVE Final    Comment: (NOTE) SARS-CoV-2 target nucleic acids are NOT DETECTED.  The SARS-CoV-2 RNA is generally detectable in upper respiratory specimens during the acute phase of infection. The lowest concentration of SARS-CoV-2 viral copies this assay can detect is 138 copies/mL. A negative result does not preclude SARS-Cov-2 infection and should not be used as the sole basis for treatment or other patient management decisions. A negative result may occur with  improper specimen collection/handling, submission of specimen other than nasopharyngeal swab, presence of viral mutation(s) within the areas  targeted by this assay, and inadequate number of viral copies(<138 copies/mL). A negative result must be combined with clinical observations, patient history, and epidemiological information. The expected result is Negative.  Fact Sheet for Patients:  EntrepreneurPulse.com.au  Fact Sheet for Healthcare Providers:  IncredibleEmployment.be  This test is  no t yet approved or cleared by the [place] and  has been authorized for detection and/or diagnosis of SARS-CoV-2 by FDA under an Emergency Use Authorization (EUA). This EUA will remain  in effect (meaning this test can be used) for the duration of the COVID-19 declaration under Section 564(b)(1) of the Act, 21 U.S.C.section 360bbb-3(b)(1), unless the authorization is terminated  or revoked sooner.       Influenza A by PCR NEGATIVE NEGATIVE Final   Influenza B by PCR NEGATIVE NEGATIVE Final    Comment: (NOTE) The Xpert Xpress SARS-CoV-2/FLU/RSV plus assay is intended as an aid in the diagnosis of influenza from Nasopharyngeal swab specimens and should not be used as a sole basis for treatment. Nasal washings and aspirates are unacceptable for Xpert Xpress SARS-CoV-2/FLU/RSV testing.  Fact Sheet for Patients: EntrepreneurPulse.com.au  Fact Sheet for Healthcare Providers: IncredibleEmployment.be  This test is not yet approved or cleared by the [place] FDA and has been authorized for detection and/or diagnosis of SARS-CoV-2 by FDA under an Emergency Use Authorization (EUA). This EUA will remain in effect (meaning this test can be used) for the duration of the COVID-19 declaration under Section 564(b)(1) of the Act, 21 U.S.C. section 360bbb-3(b)(1), unless the authorization is terminated or revoked.  Performed at [facility], [place] [address] [place], [place] [address]   Culture, blood (routine x 2)     Status: None (Preliminary result)    Collection Time: 10/13/20  8:43 PM   Specimen: BLOOD RIGHT HAND  Result Value Ref Range Status   Specimen Description BLOOD RIGHT HAND  Final   Special Requests   Final    BOTTLES DRAWN AEROBIC AND ANAEROBIC Blood Culture results may not be optimal due to an inadequate volume of blood received in culture bottles   Culture   Final    NO GROWTH < 12 HOURS Performed at [facility], [place] [address] [place], [place] [address]    Report Status PENDING  Incomplete  Culture, blood (routine x 2)     Status: None (Preliminary result)   Collection Time: 10/13/20  8:43 PM   Specimen: BLOOD LEFT FOREARM  Result Value Ref Range Status   Specimen Description BLOOD LEFT FOREARM  Final   Special Requests   Final    BOTTLES DRAWN AEROBIC AND ANAEROBIC Blood Culture results may not be optimal due to an inadequate volume of blood received in culture bottles   Culture   Final    NO GROWTH < 12 HOURS Performed at [facility], [place] [address] [place], [place] [address]    Report Status PENDING  Incomplete    Renal Function: Recent Labs    10/13/20 1029 10/13/20 1942 10/13/20 2058 10/14/20 0501  CREATININE 3.33* 3.26* 3.50* 3.02*   Estimated Creatinine Clearance: 9.5 mL/min (A) (by C-G formula based on SCr of 3.02 mg/dL (H)).  Radiologic Imaging: CT Renal Stone Study  Result Date: 10/13/2020 CLINICAL DATA:  Hematuria renal parenchymal disease EXAM: CT ABDOMEN AND PELVIS WITHOUT CONTRAST TECHNIQUE: Multidetector CT imaging of the abdomen and pelvis was performed following the standard protocol without IV contrast. COMPARISON:  CT 04/26/2019 FINDINGS: Lower chest: Lung bases demonstrate no acute consolidation or effusion. Mild cardiomegaly. Coronary calcifications. Hepatobiliary: No calcified gallstone. No focal hepatic abnormality or biliary dilatation. Pancreas: Unremarkable. No pancreatic ductal dilatation or surrounding inflammatory changes. Spleen: Normal in size without focal  abnormality. Adrenals/Urinary Tract: Adrenal glands are within normal limits. 22 mm stone  in the right renal pelvis with mild upper pole hydronephrosis. Numerous stones within the lower pole collecting system on the right. Multiple left-sided kidney stones, the largest is also seen in the left renal pelvis and measures 8 mm. 9 mm hyperdense nodule upper pole right kidney, slightly more apparent. 14 mm hyperdense exophytic nodule midpole right kidney without change. Cysts within the bilateral kidneys. Solid appearing mass exophytic to the lower pole left kidney measures 4.2 x 3.6 cm by 5.9 cm, previously 3.9 x 3.4 x 5.5 cm. Slightly thick-walled urinary bladder Stomach/Bowel: Stomach nonenlarged. No dilated small bowel. No acute bowel wall thickening. Diverticular disease of the left colon without acute inflammatory process. Vascular/Lymphatic: Moderate aortic atherosclerosis. No aneurysm. No increasing adenopathy. Stable 11 mm oval nodule adjacent to the left hepatic lobe. Reproductive: Status post hysterectomy. No adnexal masses. Other: Negative for free air or free fluid. Musculoskeletal: Left hip replacement with artifact. Scoliosis of the spine. 9 mm anterolisthesis L4 on L5. Advanced degenerative changes throughout the lumbar spine. IMPRESSION: 1. Numerous bilateral kidney stones, including a 22 mm right renal pelvic stone with mild upper pole hydronephrosis. 2. Slight interval increase in size of solid appearing mass lesion involving lower pole left kidney, now measuring up to 5.9 cm and suspicious for carcinoma. 3. Slightly thick-walled urinary bladder is questionable for mild cystitis 4. Extensive left colon diverticular disease without acute inflammation Electronically Signed   By: Donavan Foil M.D.   On: 10/13/2020 21:52    I independently reviewed the above imaging studies.  Impression/Recommendation 87 year old female with a nonobstructing 2 cm right renal stone, UTI, AKI, hyperakalemia as well as  a solid 5.9 cm left renal mass with features concerning for renal cell carcinoma  -I had a long discussion with the patient and her daughter concerning her large right renal stone as well as her left renal mass.    1.  Concerning the right renal stone, she would require a PCNL or multiple staged ureteroscopies to address her very large stone burden.  The stone could potentially be the nidus for her recurrent UTIs vs her lower urinary tract.  Do to the lack of high grade obstruction involving the right kidney, a ureteral stent would be of little utility.  The patient and her daughter are adamantly opposed to any invasive surgical therapy to address her right-sided stone burden.  Recommend continued IV fluid resuscitation and culture specific antibiotics.  2.  Left renal mass: The mass has been relatively stable over the past 2 years.  She is currently not a surgical candidate for nephrectomy.  Recommend continued observation.  Please call back as needed.  I will arrange outpatient follow-up in the coming weeks.  Ellison Hughs, MD [facility] 10/14/2020, 10:06 AM

## 2020-10-14 NOTE — Consult Note (Signed)
[facility] Nurse Consult Note: Patient receiving care in [place]. Daughter of patient in room. Reason for Consult: ruptured right great toe blister Wound type: as above Pressure Injury POA: Yes/No/NA Measurement: na Wound bed: yellowish surrounded by pink Drainage (amount, consistency, odor) none Periwound: intact Dressing procedure/placement/frequency: 2% bactroban ointment twice daily, apply to right great toe wound. Cover with Mepilex Border Flex Lite foam dressing. Thank you for the consult.  Discussed plan of care with the patient's daughter and bedside nurse.  [place] nurse will not follow at this time.  Please re-consult the [place] team if needed.  Val Riles, RN, MSN, CWOCN, CNS-BC, pager [phone]

## 2020-10-14 NOTE — Consult Note (Signed)
Consultation Note Date: 10/14/2020   Patient Name: [name]  DOB: [birth year]  MRN: [id number]  Age / Sex: 87 y.o., female  PCP: Hoyt Koch, MD Referring Physician: Dessa Phi, DO  Reason for Consultation: Establishing goals of care  HPI/Patient Profile: 87 y.o. female  with past medical history of COPD, renal mass (no plans for work up or surgical intervention), HTN, HLD, colon polyps, hypokalemia admitted on 10/13/2020 with hyperkalemia and renal failure with findings of numerous bilateral kidney stones. Urology has weighed in and patient/family noted to be against any surgical/invasive intervention. Palliative care requested to assist with goals of care given functional decline at home and now declining renal function.   Clinical Assessment and Goals of Care: I met today at [name]' bedside with son present. Son call and has me speak with his sister, [name], who is a Marine scientist and guides her mother's care. They share with me that [name] has been having functional decline - more sedentary (although she can ambulate) and decreased intake and weight loss. She has renal mass and now kidney stones but they are clear that they are not interested in any surgical or invasive interventions. [name] shares desire to provide IVF and stabilize potassium as goal for this hospitalization.   They are undergoing discussions on how to move forward with her mother's care as she has been living independently (with children in and out) but now needing more assistance. We discussed many options and [name] has reached out to her mother's long term care policy to see what this will cover. [name] would ideally want her mother at home but they would need to have extra caregiver support as she still works and she and her brother cannot do it 24/7 themselves. We discussed home with caregivers (long term or private pay) and  then we discussed therapy vs hospice. I shared that with [name]' renal failure and decreased intake I feel that they would get more benefit out of hospice support at home as they want to keep her out of the hospital. If they cannot arrange private caregivers than they may need to consider facility placement for rehab vs long term care. I also told [name] I would leave her MOST form at bedside to review. I will have one of my colleagues follow up with [name] in the morning.   All questions/concerns addressed. Emotional support provided.   Primary Decision Maker HCPOA daughter [name]    Code Status/Advance Care Planning:  DNR   Symptom Management:   Per attending.   Palliative Prophylaxis:   Bowel Regimen, Delirium Protocol, Frequent Pain Assessment, Oral Care and Turn Reposition  Prognosis:   < 6 months very likely with significantly declining functional status and now with renal failure combined with chronic poor intake.   Discharge Planning: To Be Determined      Primary Diagnoses: Present on Admission: . AKI (acute kidney injury) ([place]) . Hyperkalemia . Essential hypertension . COPD, mild ([place]) . Hyperlipidemia . Left kidney mass . Severe protein-calorie malnutrition (  [place])   I have reviewed the medical record, interviewed the patient and family, and examined the patient. The following aspects are pertinent.  Past Medical History:  Diagnosis Date  . ALLERGIC RHINITIS 05/29/2007  . CHRONIC OBSTRUCTIVE PULMONARY DISEASE, ACUTE EXACERBATION 11/21/2007  . COLONIC POLYPS, HX OF 11/21/2007  . COPD 11/21/2007  . DIVERTICULOSIS, COLON 11/21/2007  . HIP PAIN, LEFT, CHRONIC 06/01/2010  . HYPERLIPIDEMIA 05/29/2007  . HYPERTENSION 05/29/2007  . Hypopotassemia 06/12/2007  . HYSTERECTOMY, HX OF 05/29/2007  . Other acquired absence of organ 05/29/2007  . Other postprocedural status(V45.89) 05/29/2007  . RADICULOPATHY 05/29/2007  . REACTIVE AIRWAY DISEASE 05/29/2007    Social History   Socioeconomic History  . Marital status: Widowed    Spouse name: Not on file  . Number of children: Not on file  . Years of education: Not on file  . Highest education level: Not on file  Occupational History  . Not on file  Tobacco Use  . Smoking status: Former Smoker    Quit date: 06/14/1980    Years since quitting: 40.3  . Smokeless tobacco: Never Used  Vaping Use  . Vaping Use: Never used  Substance and Sexual Activity  . Alcohol use: No    Alcohol/week: 0.0 standard drinks  . Drug use: No  . Sexual activity: Never  Other Topics Concern  . Not on file  Social History Narrative   HSG. Married Aug 13, 2048- 40yrdivorced. Married 103-03-62 widowed 203-03-01 2 daughters-'54 (deceased ovarian cancer), '08-14-2063 1 son '2060/08/13  3 grandsons;Retired; aPress photographerwork. Lives alone- ADLs   End of life issues: Yes - CPR.  no heroic measures; i.e. Feeding tubes, mechanical ventilation   Social Determinants of Health   Financial Resource Strain: Not on file  Food Insecurity: Not on file  Transportation Needs: Not on file  Physical Activity: Not on file  Stress: Not on file  Social Connections: Not on file   Family History  Problem Relation Age of Onset  . Arthritis Mother   . Heart disease Mother   . Uterine cancer Daughter   . Cancer Daughter        ovarian  . Heart disease Father   . Diabetes Neg Hx   . COPD Neg Hx   . Stroke Neg Hx   . Kidney disease Neg Hx    Scheduled Meds: . amLODipine  10 mg Oral Daily  . aspirin EC  81 mg Oral Daily  . cycloSPORINE  1 drop Both Eyes BID  . enoxaparin (LOVENOX) injection  30 mg Subcutaneous Q24H  . feeding supplement (NEPRO CARB STEADY)  237 mL Oral BID BM  . [START ON 10/15/2020] fluticasone furoate-vilanterol  1 puff Inhalation Daily  . mupirocin ointment   Topical BID   Continuous Infusions: . cefTRIAXone (ROCEPHIN)  IV    . lactated ringers 125 mL/hr at 10/14/20 0605   PRN Meds:.acetaminophen **OR** acetaminophen, albuterol,  ondansetron **OR** ondansetron (ZOFRAN) IV Allergies  Allergen Reactions  . Prednisone     Patient became jittery all over  . Codeine    Review of Systems  Constitutional: Positive for activity change, appetite change and fatigue.  Gastrointestinal: Negative for abdominal pain.    Physical Exam Vitals and nursing note reviewed.  Constitutional:      General: She is not in acute distress.    Appearance: She is well-groomed. She is ill-appearing.  Cardiovascular:     Rate and Rhythm: Normal rate.  Pulmonary:     Effort: No tachypnea, accessory muscle  usage or respiratory distress.  Abdominal:     Palpations: Abdomen is soft.     Tenderness: There is no abdominal tenderness.  Neurological:     Mental Status: She is alert.     Comments: Hard of hearing     Vital Signs: BP (!) 119/54 (BP Location: Left Arm)   Pulse 90   Temp 98.4 F (36.9 C) (Oral)   Resp 20   Wt 49.6 kg   SpO2 98%   BMI 18.77 kg/m  Pain Scale: 0-10   Pain Score: 0-No pain   SpO2: SpO2: 98 % O2 Device:SpO2: 98 % O2 Flow Rate: .   IO: Intake/output summary:   Intake/Output Summary (Last 24 hours) at 10/14/2020 1208 Last data filed at 10/14/2020 1000 Gross per 24 hour  Intake 110 ml  Output 375 ml  Net -265 ml    LBM: Last BM Date: 10/13/20 Baseline Weight: Weight: 49.6 kg Most recent weight: Weight: 49.6 kg     Palliative Assessment/Data:     Time In: 1420 Time Out: 1310 Time Total: 50 min Greater than 50%  of this time was spent counseling and coordinating care related to the above assessment and plan.  Signed by: Vinie Sill, NP Palliative Medicine Team Pager # [phone] (M-F 8a-5p) Team Phone # [phone] (Nights/Weekends)

## 2020-10-15 DIAGNOSIS — N10 Acute tubulo-interstitial nephritis: Secondary | ICD-10-CM

## 2020-10-15 DIAGNOSIS — E43 Unspecified severe protein-calorie malnutrition: Secondary | ICD-10-CM

## 2020-10-15 DIAGNOSIS — Z66 Do not resuscitate: Secondary | ICD-10-CM

## 2020-10-15 LAB — URINE CULTURE

## 2020-10-15 LAB — BASIC METABOLIC PANEL
Anion gap: 8 (ref 5–15)
BUN: 51 mg/dL — ABNORMAL HIGH (ref 8–23)
CO2: 24 mmol/L (ref 22–32)
Calcium: 9.2 mg/dL (ref 8.9–10.3)
Chloride: 105 mmol/L (ref 98–111)
Creatinine, Ser: 2.09 mg/dL — ABNORMAL HIGH (ref 0.44–1.00)
GFR, Estimated: 22 mL/min — ABNORMAL LOW (ref >60)
Glucose, Bld: 77 mg/dL (ref 70–99)
Potassium: 4.5 mmol/L (ref 3.5–5.1)
Sodium: 137 mmol/L (ref 135–145)

## 2020-10-15 LAB — CBC
HCT: 37.1 % (ref 36.0–46.0)
Hemoglobin: 11.8 g/dL — ABNORMAL LOW (ref 12.0–15.0)
MCH: 30.7 pg (ref 26.0–34.0)
MCHC: 31.8 g/dL (ref 30.0–36.0)
MCV: 96.6 fL (ref 80.0–100.0)
Platelets: 343 10*3/uL (ref 150–400)
RBC: 3.84 MIL/uL — ABNORMAL LOW (ref 3.87–5.11)
RDW: 14.1 % (ref 11.5–15.5)
WBC: 9.6 10*3/uL (ref 4.0–10.5)
nRBC: 0 % (ref 0.0–0.2)

## 2020-10-15 LAB — SARS CORONAVIRUS 2 (TAT 6-24 HRS): SARS Coronavirus 2: NEGATIVE

## 2020-10-15 MED ORDER — COVID-19 MRNA VAC-TRIS(PFIZER) 30 MCG/0.3ML IM SUSP
0.3000 mL | Freq: Once | INTRAMUSCULAR | Status: AC
  Administered 2020-10-15: 0.3 mL via INTRAMUSCULAR
  Filled 2020-10-15: qty 0.3

## 2020-10-15 NOTE — Evaluation (Signed)
Occupational Therapy Evaluation Patient Details Name: [name] MRN: [id number] DOB: July 16, 1926 Today's Date: 10/15/2020    History of Present Illness 86 y.o. female presenting to ED on 10/13/2020 with poor p.o. intake, lethargy, and AMS. Patient found to have AKI, acute lower UTI, severe protein-calorie malnutrition and hyperkalemia. PMHX significant for COPD, HLD, HTN, L kidney mass, and recurrent UTIs, unspecified radiculopathy.   Clinical Impression   PTA patient was living at home alone with assist from family for ADLs/IADLs and transportation. Son present at bedside provided home set-up and PLOF and states that his mother has 6-8 hours of intermittent assist 7 days a week from himself and from his sister. Patient currently functioning below baseline requiring Min A for sit to stand and stand pivot transfers to [facility] this date with HHA +1. Patient also reports frequent urge to urinate.This OT noted that patient does not pass much urine at a time. Patient limited by generalized debility and weakness requiring increased need for external assist for completion of self-care tasks and would benefit from continued acute OT services in prep for safe d/c to next level of care. Recommendation for SNF rehab. Family would prefer placement at [facility].     Follow Up Recommendations  SNF;Supervision/Assistance - 24 hour    Equipment Recommendations  Other (comment) (Defer to next level of care)    Recommendations for Other Services       Precautions / Restrictions Precautions Precautions: Fall Restrictions Weight Bearing Restrictions: No      Mobility Bed Mobility               General bed mobility comments: Patient sleeps in a recliner at home. Patient in recliner upon entry.    Transfers Overall transfer level: Needs assistance Equipment used: 1 person hand held assist Transfers: Sit to/from [employer] Sit to Stand: Min assist Stand pivot transfers: Min  assist       General transfer comment: Min A for sit <> stand from low recliner and BSC. Min A with HHA +1 for SPT to [facility].    Balance Overall balance assessment: Needs assistance Sitting-balance support: Single extremity supported;Feet supported Sitting balance-Leahy Scale: Fair Sitting balance - Comments: Maintains static sitting balance in recliner without external assist.   Standing balance support: Single extremity supported;During functional activity Standing balance-Leahy Scale: Poor Standing balance comment: Reliant on at least unilateral UE support via HHA to maintain static standing balance.                           ADL either performed or assessed with clinical judgement   ADL Overall ADL's : Needs assistance/impaired Eating/Feeding: Set up                       Toilet Transfer: Minimal assistance Toilet Transfer Details (indicate cue type and reason): Min A and HHA +1 for transfer to [facility]. Patient only able to pass minimal urine. Toileting- Clothing Manipulation and Hygiene: Minimal assistance;Sit to/from stand Toileting - Clothing Manipulation Details (indicate cue type and reason): Min A for clothing management in standing.     Functional mobility during ADLs: Minimal assistance General ADL Comments: Patient limited by generalized weakness requiring increased assistance from others for completion of ADLs.     Vision Baseline Vision/History: Wears glasses Wears Glasses: Reading only Patient Visual Report: No change from baseline Vision Assessment?: No apparent visual deficits     Perception  Praxis      Pertinent Vitals/Pain Pain Assessment: No/denies pain     Hand Dominance Right   Extremity/Trunk Assessment Upper Extremity Assessment Upper Extremity Assessment: Generalized weakness   Lower Extremity Assessment Lower Extremity Assessment: Defer to PT evaluation   Cervical / Trunk Assessment Cervical / Trunk Assessment:  Kyphotic (Forward head)   Communication     Cognition Arousal/Alertness: Awake/alert Behavior During Therapy: Agitated Overall Cognitive Status: History of cognitive impairments - at baseline                                     General Comments  Son present at bedside.    Exercises     Shoulder Instructions      Home Living Family/patient expects to be discharged to:: Private residence Living Arrangements: Alone Available Help at Discharge: Family;Available PRN/intermittently Type of Home: House Home Access: Stairs to enter [employer] of Steps: 4 Entrance Stairs-Rails: None Home Layout: One level     Bathroom Shower/Tub: Tub/shower unit;Walk-in shower   Bathroom Toilet: Handicapped height     Home Equipment: Cane - quad;Bedside commode;Shower seat - built in;Hand held shower head;Wheelchair - manual          Prior Functioning/Environment Level of Independence: Needs assistance  Gait / Transfers Assistance Needed: Mod I with QC in home short distances. WC in community dwellings. ADL's / Homemaking Assistance Needed: Assist with bathing/dressing and IADLs.            OT Problem List: Decreased strength;Decreased activity tolerance;Impaired balance (sitting and/or standing);Decreased cognition      OT Treatment/Interventions: Self-care/ADL training;Therapeutic exercise;Energy conservation;DME and/or AE instruction;Therapeutic activities;Patient/family education;Balance training    OT Goals(Current goals can be found in the care plan section) Acute Rehab OT Goals Patient Stated Goal: Per son; for patient to go to rehab. OT Goal Formulation: With patient/family Time For Goal Achievement: 10/29/20 Potential to Achieve Goals: Fair ADL Goals Pt Will Perform Grooming: with set-up;sitting Pt Will Perform Upper Body Dressing: with set-up;sitting Pt Will Perform Lower Body Dressing: with min assist;sit to/from stand Pt Will Transfer to  Toilet: with supervision;bedside commode Pt Will Perform Toileting - Clothing Manipulation and hygiene: with supervision;sit to/from stand  OT Frequency: Min 2X/week   Barriers to D/C:            Co-evaluation              AM-PAC OT "6 Clicks" Daily Activity     Outcome Measure Help from another person eating meals?: A Little Help from another person taking care of personal grooming?: A Little Help from another person toileting, which includes using toliet, bedpan, or urinal?: A Little Help from another person bathing (including washing, rinsing, drying)?: A Lot Help from another person to put on and taking off regular upper body clothing?: A Little Help from another person to put on and taking off regular lower body clothing?: A Lot 6 Click Score: 16   End of Session Equipment Utilized During Treatment: Gait belt Nurse Communication: Mobility status  Activity Tolerance: Patient tolerated treatment well Patient left: in chair;with call bell/phone within reach;with chair alarm set;with family/visitor present  OT Visit Diagnosis: Unsteadiness on feet (R26.81);Muscle weakness (generalized) (M62.81)                Time: 1324-4010 OT Time Calculation (min): 16 min Charges:  OT General Charges $OT Visit: 1 Visit OT Evaluation $OT Eval Moderate  Complexity: 1 Mod  Jamyia Fortune H. OTR/L Supplemental OT, Department of rehab services [phone]  Cherica Heiden R H. 10/15/2020, 2:09 PM

## 2020-10-15 NOTE — NC FL2 (Addendum)
[place] LEVEL OF CARE SCREENING TOOL     IDENTIFICATION  Patient Name: DELAYNEE ALRED Birthdate: [birth year] Sex: female Admission Date (Current Location): 10/13/2020  County and [place] Number:  Herbalist and Address:  The [place], [place] [address], [place], [place] [address]      Provider Number: [id number]  Attending Physician Name and Address:  Dessa Phi, DO  Relative Name and Phone Number:  Cherie Ouch (Daughter)   [phone]    Current Level of Care: SNF Recommended Level of Care: [place] Prior Approval Number:    Date Approved/Denied:   PASRR Number:   [id number]  Discharge Plan: SNF    Current Diagnoses: Patient Active Problem List   Diagnosis Date Noted  . Acute lower UTI 10/14/2020  . Urinary urgency 10/13/2020  . AKI (acute kidney injury) ([place]) 10/13/2020  . Hyperkalemia 10/13/2020  . Left kidney mass 10/13/2020  . Severe protein-calorie malnutrition ([place]) 10/13/2020  . Advanced care planning/counseling discussion 07/17/2013  . Chronic low back pain 06/18/2012  . Routine health maintenance 06/18/2011  . COPD, mild ([place]) 11/21/2007  . Hyperlipidemia 05/29/2007  . Essential hypertension 05/29/2007  . Allergic rhinitis 05/29/2007    Orientation RESPIRATION BLADDER Height & Weight     Self  Normal Continent Weight: 113 lb 12.8 oz (51.6 kg) (scale c) Height:     BEHAVIORAL SYMPTOMS/MOOD NEUROLOGICAL BOWEL NUTRITION STATUS      Continent Diet (See DC Summary)  AMBULATORY STATUS COMMUNICATION OF NEEDS Skin     Verbally                         Personal Care Assistance Level of Assistance  Feeding,Dressing,Bathing Bathing Assistance: Maximum assistance Feeding assistance: Independent Dressing Assistance: Maximum assistance     Functional Limitations Info  Sight,Hearing,Speech Sight Info: Impaired Hearing Info: Impaired Speech Info: Adequate    SPECIAL CARE FACTORS  FREQUENCY  OT (By licensed OT),PT (By licensed PT)     PT Frequency: 5x a week OT Frequency: 5x a week            Contractures Contractures Info: Not present    Additional Factors Info  Code Status,Allergies Code Status Info: DNR Allergies Info: Prednisone, Codeine           Current Medications (10/15/2020):  This is the current hospital active medication list Current Facility-Administered Medications  Medication Dose Route Frequency Provider Last Rate Last Admin  . acetaminophen (TYLENOL) tablet 650 mg  650 mg Oral Q6H PRN Reubin Milan, MD   650 mg at 10/14/20 0424   Or  . acetaminophen (TYLENOL) suppository 650 mg  650 mg Rectal Q6H PRN Reubin Milan, MD      . albuterol (VENTOLIN HFA) 108 (90 Base) MCG/ACT inhaler 2 puff  2 puff Inhalation Q6H PRN Reubin Milan, MD      . amLODipine [facility]) tablet 10 mg  10 mg Oral Daily Reubin Milan, MD   10 mg at 10/15/20 0849  . aspirin EC tablet 81 mg  81 mg Oral Daily Reubin Milan, MD   81 mg at 10/15/20 3474  . cefTRIAXone (ROCEPHIN) 1 g in sodium chloride 0.9 % 100 mL IVPB  1 g Intravenous Q24H Reubin Milan, MD 200 mL/hr at 10/14/20 2059 1 g at 10/14/20 2059  . cycloSPORINE (RESTASIS) 0.05 % ophthalmic emulsion 1 drop  1 drop Both Eyes BID Reubin Milan, MD  1 drop at 10/15/20 0850  . enoxaparin (LOVENOX) injection 30 mg  30 mg Subcutaneous Q24H Reubin Milan, MD   30 mg at 10/15/20 [phone]  . feeding supplement (ENSURE ENLIVE / ENSURE PLUS) liquid 237 mL  237 mL Oral TID BM Dessa Phi, DO   237 mL at 10/15/20 0857  . fluticasone furoate-vilanterol (BREO ELLIPTA) 100-25 MCG/INH 1 puff  1 puff Inhalation Daily Dessa Phi, DO   1 puff at 10/15/20 0829  . lactated ringers infusion   Intravenous Continuous Charlesetta Shanks, MD 125 mL/hr at 10/15/20 [place] at 10/15/20 1203  . multivitamin with minerals tablet 1 tablet  1 tablet Oral Daily Dessa Phi, DO   1 tablet at 10/15/20  0849  . mupirocin ointment (BACTROBAN) 2 %   Topical BID Dessa Phi, DO   Given at 10/15/20 1020  . ondansetron (ZOFRAN) tablet 4 mg  4 mg Oral Q6H PRN Reubin Milan, MD       Or  . ondansetron [facility]) injection 4 mg  4 mg Intravenous Q6H PRN Reubin Milan, MD         Discharge Medications: Please see discharge summary for a list of discharge medications.  Relevant Imaging Results:  Relevant Lab Results:   Additional Information SSN# [id number]  Reece Agar, [place]

## 2020-10-15 NOTE — Plan of Care (Signed)
  Problem: Clinical Measurements: Goal: Will remain free from infection Outcome: Progressing Goal: Diagnostic test results will improve Outcome: Progressing   Problem: Coping: Goal: Level of anxiety will decrease Outcome: Progressing   Problem: Safety: Goal: Ability to remain free from injury will improve Outcome: Progressing   

## 2020-10-15 NOTE — TOC Initial Note (Addendum)
Transition of Care [facility]) - Initial/Assessment Note    Patient Details  Name: [name] MRN: [id number] Date of Birth: 1927-03-17  [facility] CM/SW Contact:    Bartholomew Crews, RN Phone Number: [phone] 10/15/2020, 9:59 AM  Clinical Narrative:                  Spoke with patient's daughter, Rosann Auerbach, and son, Shanon Brow, at the bedside. PTA home alone - spent most of her time in a recliner. Ambulated with 4-prong cane and recent transition to walker. Has not driven in several years. PCP as listed in Epic.   Daughter researching caregiver, hospice, and facility options. Patient has been to [facility] in the past following hip surgery. Daughter is interested in patient receiving skilled care to maximize potential, then ideally would like to have her mom return home with hospice and caregiver and family assistance. Requested PT/OT evals from attending MD.  Provided resources for hospice and homecare. Daughter would like patient to go [facility] for facility choice. Patient has received 2 doses of Pfizer vaccine. Daughter is agreeable to patient receiving Pfizer booster (last dose was in October).   TOC following for transition needs.   Expected Discharge Plan: Skilled Nursing Facility Barriers to Discharge: SNF Pending bed offer   Patient Goals and CMS Choice Patient states their goals for this hospitalization and ongoing recovery are:: would like skilled care then dc home with hospice CMS Medicare.gov Compare Post Acute Care list provided to:: Patient Represenative (must comment) (daughter, Rosann Auerbach) Choice offered to / list presented to : Adult Children  Expected Discharge Plan and Services Expected Discharge Plan: [place] In-house Referral: Clinical Social Work,Hospice / Palliative Care Discharge Planning Services: CM Consult Post Acute Care Choice: [place] Living arrangements for the past 2 months: Single Family Home                  DME Arranged: N/A DME Agency: NA         HH Agency: NA        Prior Living Arrangements/Services Living arrangements for the past 2 months: [place] Lives with:: Self Patient language and need for interpreter reviewed:: Yes        Need for Family Participation in Patient Care: Yes (Comment) Care giver support system in place?: Yes (comment) (in progress - resources provided) Current home services: DME (4-prong cane; walker) Criminal Activity/Legal Involvement Pertinent to Current Situation/Hospitalization: No - Comment as needed  Activities of Daily Living Home Assistive Devices/Equipment: Cane (specify quad or straight),Shower chair with back,Eyeglasses,Other (Comment) (recliner.) ADL Screening (condition at time of admission) Patient's cognitive ability adequate to safely complete daily activities?: No Is the patient deaf or have difficulty hearing?: Yes Does the patient have difficulty seeing, even when wearing glasses/contacts?: No Does the patient have difficulty concentrating, remembering, or making decisions?: Yes Patient able to express need for assistance with ADLs?: Yes Does the patient have difficulty dressing or bathing?: Yes Independently performs ADLs?: No Communication: Independent Is this a change from baseline?: Pre-admission baseline Dressing (OT): Dependent Is this a change from baseline?: Pre-admission baseline Grooming: Dependent Is this a change from baseline?: Pre-admission baseline Feeding: Needs assistance Is this a change from baseline?: Pre-admission baseline Bathing: Dependent Is this a change from baseline?: Pre-admission baseline Toileting: Independent with device (comment) (uses a cane to ambulate to BR at home) In/Out Bed: Needs assistance Is this a change from baseline?: Pre-admission baseline Walks in Home: Independent  with device (comment) Does the patient have difficulty walking or climbing stairs?: Yes Weakness of Legs:  Both Weakness of Arms/Hands: Both  Permission Sought/Granted                  Emotional Assessment Appearance:: Appears stated age     Orientation: : Oriented to Self Alcohol / Substance Use: Not Applicable Psych Involvement: No (comment)  Admission diagnosis:  Hyperkalemia [E87.5] AKI (acute kidney injury) ([place]) [N17.9] Acute pyelonephritis [N10] Patient Active Problem List   Diagnosis Date Noted  . Acute lower UTI 10/14/2020  . Urinary urgency 10/13/2020  . AKI (acute kidney injury) ([place]) 10/13/2020  . Hyperkalemia 10/13/2020  . Left kidney mass 10/13/2020  . Severe protein-calorie malnutrition ([place]) 10/13/2020  . Advanced care planning/counseling discussion 07/17/2013  . Chronic low back pain 06/18/2012  . Routine health maintenance 06/18/2011  . COPD, mild ([place]) 11/21/2007  . Hyperlipidemia 05/29/2007  . Essential hypertension 05/29/2007  . Allergic rhinitis 05/29/2007   PCP:  Hoyt Koch, MD Pharmacy:   [facility], [address] [place] [address] Phone: [phone] Fax: [phone]     Social Determinants of Health (SDOH) Interventions    Readmission Risk Interventions No flowsheet data found.

## 2020-10-15 NOTE — Progress Notes (Signed)
10/15/20 1657  PT Evaluation Information  Last PT Received On 10/15/20  Assistance Needed +1 (chair follow may be helpful for mobility progression)  History of Present Illness 87 y.o. female presenting to ED on 10/13/2020 with poor p.o. intake, lethargy, and AMS. Patient found to have AKI, acute lower UTI, severe protein-calorie malnutrition and hyperkalemia. PMHX significant for COPD, HLD, HTN, L kidney mass, and recurrent UTIs, unspecified radiculopathy.  Precautions  Precautions Fall  Restrictions  Weight Bearing Restrictions No  Home Living  Family/patient expects to be discharged to: Private residence  Living Arrangements Alone  Available Help at Discharge Family;Available PRN/intermittently  Type of Home House  Home Access Stairs to enter  Entrance Stairs-Number of Steps 4  Entrance Stairs-Rails None  Home Layout One level  Bathroom Shower/Tub Tub/shower unit;Walk-in shower  [place] - quad;BSC;Shower seat - built in;Hand held shower head;Wheelchair - manual  Prior Function  Level of Independence Needs assistance  Gait / Transfers Assistance Needed Mod I with QC in home short distances. WC in community dwellings.  ADL's / [place] with bathing/dressing and IADLs.  Communication  Communication HOH  Pain Assessment  Pain Assessment Faces  Faces Pain Scale 4  Pain Location neck and head  Pain Descriptors / Indicators Aching;Grimacing;Guarding  Pain Intervention(s) Limited activity within patient's tolerance;Monitored during session;Repositioned  Cognition  Arousal/Alertness Awake/alert  Behavior During Therapy Agitated  Overall Cognitive Status History of cognitive impairments - at baseline  General Comments Agitation noted at times throughout session, but pt easily redirectable. Son present and helpful.  Upper Extremity Assessment  Upper Extremity Assessment Defer to OT evaluation  Lower Extremity  Assessment  Lower Extremity Assessment Generalized weakness  Cervical / Trunk Assessment  Cervical / Trunk Assessment Kyphotic (Forward head)  Bed Mobility  General bed mobility comments In recliner upon entry.  Transfers  Overall transfer level Needs assistance  Equipment used Rolling walker (2 wheeled)  Transfers Sit to/from [facility]  Sit to Stand Min assist  Stand pivot transfers Min assist  General transfer comment Min A for sit <> stand and stand pivot transfer to assist with lifting and steadying. Multimodal cues required for safe use of RW.  Balance  Overall balance assessment Needs assistance  Sitting-balance support Single extremity supported;Feet supported  Sitting balance-Leahy Scale Fair  Sitting balance - Comments Maintains static sitting balance in recliner without external assist.  Standing balance support During functional activity;Bilateral upper extremity supported  Standing balance-Leahy Scale Poor  Standing balance comment Required BUE support  General Comments  General comments (skin integrity, edema, etc.) Son present during session.  PT - End of Session  Equipment Utilized During Treatment Gait belt  Activity Tolerance Patient tolerated treatment well  Patient left in chair;with call bell/phone within reach;with chair alarm set  Nurse Communication Mobility status  PT Assessment  PT Recommendation/Assessment Patient needs continued PT services  PT Visit Diagnosis Unsteadiness on feet (R26.81);Muscle weakness (generalized) (M62.81);History of falling (Z91.81)  PT Problem List Decreased strength;Decreased activity tolerance;Decreased balance;Decreased mobility;Decreased knowledge of use of DME;Decreased knowledge of precautions  PT Plan  PT Frequency (ACUTE ONLY) Min 2X/week  PT Treatment/Interventions (ACUTE ONLY) DME instruction;Gait training;Functional mobility training;Therapeutic activities;Therapeutic exercise;Balance  training;Patient/family education  AM-PAC PT "6 Clicks" Mobility Outcome Measure (Version 2)  Help needed turning from your back to your side while in a flat bed without using bedrails? 3  Help needed moving from lying on your back to sitting on  the side of a flat bed without using bedrails? 3  Help needed moving to and from a bed to a chair (including a wheelchair)? 3  Help needed standing up from a chair using your arms (e.g., wheelchair or bedside chair)? 3  Help needed to walk in hospital room? 2  Help needed climbing 3-5 steps with a railing?  1  6 Click Score 15  Consider Recommendation of Discharge To: CIR/SNF/LTACH  PT Recommendation  Follow Up Recommendations SNF;Supervision/Assistance - 24 hour  PT equipment None recommended by PT  Individuals Consulted  Consulted and Agree with Results and Recommendations Patient;Family member/caregiver  Family Member Consulted son  Acute Rehab PT Goals  Patient Stated Goal Per son; for patient to go to rehab.  PT Goal Formulation With family  Time For Goal Achievement 10/29/20  Potential to Achieve Goals Good  PT Time Calculation  PT Start Time (ACUTE ONLY) 1550  PT Stop Time (ACUTE ONLY) 1613  PT Time Calculation (min) (ACUTE ONLY) 23 min  PT General Charges  $$ ACUTE PT VISIT 1 Visit  PT Evaluation  $PT Eval Moderate Complexity 1 Mod  PT Treatments  $Therapeutic Activity 8-22 mins  Written Expression  Dominant Hand Right   Pt admitted secondary to problem above with deficits above. Pt somewhat agitated throughout session, but was agreeable to transfer to/from [facility]. Requiring min A for steadying and cues for safe walker use. Feel pt would benefit from SNF level therapies at d/c. Will continue to follow acutely.   Reuel Derby, PT, DPT  Acute Rehabilitation Services  Pager: [phone] Office: [phone]

## 2020-10-15 NOTE — Progress Notes (Signed)
PROGRESS NOTE    [name]  [id number] DOB: 01/26/1927 DOA: 10/13/2020 PCP: Hoyt Koch, MD     Brief Narrative:  [name] is a 87 y.o. female with medical history significant of allergy rhinitis, COPD, reactive airway disease, colon polyps, diverticulosis, hyperlipidemia, hypertension, hypokalemia, unspecified radiculopathy who was sent to the emergency department by her primary care physician due to abnormal labs showing AKI and hyperkalemia.  The patient's daughter stated that she has been treated twice with oral antibiotics over the last month for UTI without significant results.  The patient has been having decreased oral intake for the past few weeks, she has been sleeping more than usual and her mentation seems to be decreased particularly since Saturday.  CT renal study showed numerous bilateral kidney stones including a 22 mm right renal pelvic stone with mild upper pole hydronephrosis.  There is a slight interval increase in size of solid-appearing mass lesion on the lower pole of the left kidney.  Patient will delete that patient was admitted with AKI, hyperkalemia and UTI.  She was started on IV fluid, Lokelma, Rocephin.  New events last 24 hours / Subjective: No acute events overnight.  Daughter and son at bedside states that patient has been increasingly more agitated although physically has improved during this hospitalization.  Reportedly, she gets more agitated and angry outside of her normal home environment.  Assessment & Plan:   Principal Problem:   AKI (acute kidney injury) ([place]) Active Problems:   Hyperlipidemia   Essential hypertension   COPD, mild (HCC)   Hyperkalemia   Left kidney mass   Severe protein-calorie malnutrition (HCC)   Acute lower UTI   AKI -In combination with decreased oral intake, Bactrim use, partially obstructing component with large renal stone, renal mass -Continue IV fluid -Avoid nephrotoxins -Trend  BMP -Improving  UTI, POA  -Urine culture from primary care office was contaminated -Urine culture obtained here on 5/3 showed multiple species -Patient is at high risk of pyelonephritis and recurrent UTI due to large nephrolithiasis.  Continue IV Rocephin for now  Hyperkalemia -Resolved  Hypertension -Continue amlodipine  Left renal mass -Family declining to pursue any surgical or invasive intervention at this time  Right renal stone -Appreciate urology, due to lack of high-grade obstruction involving the right kidney, ureteral stent would be of little utility, otherwise would require PCNL or multiple staged ureteroscopies.  Family opposed to invasive surgical therapy.  Follow-up as outpatient for now  Goals of care -Planning for SNF placement on discharge, and transition to home hospice after SNF stay.  Appreciate palliative care consultation    DVT prophylaxis:  enoxaparin (LOVENOX) injection 30 mg Start: 10/14/20 1000  Code Status:     Code Status Orders  (From admission, onward)         Start     Ordered   10/14/20 0034  Do not attempt resuscitation (DNR)  Continuous       Question Answer Comment  In the event of cardiac or respiratory ARREST Do not call a "code blue"   In the event of cardiac or respiratory ARREST Do not perform Intubation, CPR, defibrillation or ACLS   In the event of cardiac or respiratory ARREST Use medication by any route, position, wound care, and other measures to relive pain and suffering. May use oxygen, suction and manual treatment of airway obstruction as needed for comfort.      10/14/20 0034        Code Status History  Date Active Date Inactive Code Status Order ID Comments User Context   10/13/2020 2244 10/14/2020 0034 Full Code [id number]  Reubin Milan, MD ED   Advance Care Planning Activity    Advance Directive Documentation   Flowsheet Row Most Recent Value  Type of Advance Directive Healthcare Power of Attorney, Living  will  Pre-existing out of facility DNR order (yellow form or pink MOST form) --  "MOST" Form in Place? --     Family Communication: Son and daughter at bedside Disposition Plan:  Status is: Inpatient  Remains inpatient appropriate because:Unsafe d/c plan and IV treatments appropriate due to intensity of illness or inability to take PO   Dispo: The patient is from: Home              Anticipated d/c is to: SNF              Patient currently is not medically stable to d/c.   Difficult to place patient No         Consultants:   Urology  Palliative care medicine   Antimicrobials:  Anti-infectives (From admission, onward)   Start     Dose/Rate Route Frequency Ordered Stop   10/14/20 2000  cefTRIAXone (ROCEPHIN) 1 g in sodium chloride 0.9 % 100 mL IVPB        1 g 200 mL/hr over 30 Minutes Intravenous Every 24 hours 10/13/20 2252     10/13/20 2000  cefTRIAXone (ROCEPHIN) 1 g in sodium chloride 0.9 % 100 mL IVPB        1 g 200 mL/hr over 30 Minutes Intravenous  Once 10/13/20 1958 10/14/20 0000       Objective: Vitals:   10/14/20 2300 10/15/20 0000 10/15/20 0457 10/15/20 0829  BP: 108/79 113/70 121/70   Pulse: 90 98 89   Resp:  20 19   Temp:  (!) 97 F (36.1 C) 98 F (36.7 C)   TempSrc:  Oral Oral   SpO2: 96% 97% 96% 96%  Weight:  51.6 kg      Intake/Output Summary (Last 24 hours) at 10/15/2020 1047 Last data filed at 10/15/2020 0951 Gross per 24 hour  Intake 3169.98 ml  Output 1650 ml  Net 1519.98 ml   Filed Weights   10/14/20 0105 10/15/20 0000  Weight: 49.6 kg 51.6 kg    Examination: General exam: Appears calm and comfortable  Respiratory system: Respiratory effort normal. Central nervous system: Alert  Extremities: Symmetric in appearance bilaterally  Psychiatry: Judgement and insight appear stable  Data Reviewed: I have personally reviewed following labs and imaging studies  CBC: Recent Labs  Lab 10/13/20 1029 10/13/20 1542 10/13/20 2058  10/14/20 0501 10/15/20 0311  WBC 10.5 12.2*  --  11.2* 9.6  NEUTROABS  --  8.2*  --   --   --   HGB 13.3 13.4 12.9 11.8* 11.8*  HCT 40.2 41.6 38.0 36.9 37.1  MCV 92.9 95.2  --  95.8 96.6  PLT 425.0* 424*  --  308 580   Basic Metabolic Panel: Recent Labs  Lab 10/13/20 1029 10/13/20 1942 10/13/20 2058 10/14/20 0501 10/14/20 1433 10/15/20 0311  NA 131* 133* 133* 132*  --  137  K 6.5* 6.4* 6.3* 6.0* 4.7 4.5  CL 101 106 112* 107  --  105  CO2 23 20*  --  19*  --  24  GLUCOSE 146* 96 94 115*  --  77  BUN 82* 78* 71* 69*  --  51*  CREATININE  3.33* 3.26* 3.50* 3.02*  --  2.09*  CALCIUM 9.9 9.5  --  9.1  --  9.2   GFR: Estimated Creatinine Clearance: 14.3 mL/min (A) (by C-G formula based on SCr of 2.09 mg/dL (H)). Liver Function Tests: Recent Labs  Lab 10/13/20 1029 10/13/20 1942  AST 18 18  ALT 11 14  ALKPHOS 92 79  BILITOT 0.4 0.8  PROT 6.9 6.4*  ALBUMIN 3.2* 2.6*   No results for input(s): LIPASE, AMYLASE in the last 168 hours. No results for input(s): AMMONIA in the last 168 hours. Coagulation Profile: No results for input(s): INR, PROTIME in the last 168 hours. Cardiac Enzymes: No results for input(s): CKTOTAL, CKMB, CKMBINDEX, TROPONINI in the last 168 hours. BNP (last 3 results) No results for input(s): PROBNP in the last 8760 hours. HbA1C: No results for input(s): HGBA1C in the last 72 hours. CBG: No results for input(s): GLUCAP in the last 168 hours. Lipid Profile: No results for input(s): CHOL, HDL, LDLCALC, TRIG, CHOLHDL, LDLDIRECT in the last 72 hours. Thyroid Function Tests: No results for input(s): TSH, T4TOTAL, FREET4, T3FREE, THYROIDAB in the last 72 hours. Anemia Panel: No results for input(s): VITAMINB12, FOLATE, FERRITIN, TIBC, IRON, RETICCTPCT in the last 72 hours. Sepsis Labs: Recent Labs  Lab 10/13/20 2055 10/13/20 2159  LATICACIDVEN 1.2 1.2    Recent Results (from the past 240 hour(s))  Urine Culture     Status: None   Collection  Time: 10/13/20 10:37 AM   Specimen: Urine  Result Value Ref Range Status   Source: NOT GIVEN  Final   Status: FINAL  Final   Isolate 1:   Final    Mixed genital flora isolated. These superficial bacteria are not indicative of a urinary tract infection. No further organism identification is warranted on this specimen. If clinically indicated, recollect clean-catch, mid-stream urine and transfer  immediately to Urine Culture Transport Tube.   Resp Panel by RT-PCR (Flu A&B, Covid) Nasopharyngeal Swab     Status: None   Collection Time: 10/13/20  3:42 PM   Specimen: Nasopharyngeal Swab; Nasopharyngeal(NP) swabs in vial transport medium  Result Value Ref Range Status   SARS Coronavirus 2 by RT PCR NEGATIVE NEGATIVE Final    Comment: (NOTE) SARS-CoV-2 target nucleic acids are NOT DETECTED.  The SARS-CoV-2 RNA is generally detectable in upper respiratory specimens during the acute phase of infection. The lowest concentration of SARS-CoV-2 viral copies this assay can detect is 138 copies/mL. A negative result does not preclude SARS-Cov-2 infection and should not be used as the sole basis for treatment or other patient management decisions. A negative result may occur with  improper specimen collection/handling, submission of specimen other than nasopharyngeal swab, presence of viral mutation(s) within the areas targeted by this assay, and inadequate number of viral copies(<138 copies/mL). A negative result must be combined with clinical observations, patient history, and epidemiological information. The expected result is Negative.  Fact Sheet for Patients:  EntrepreneurPulse.com.au  Fact Sheet for Healthcare Providers:  IncredibleEmployment.be  This test is no t yet approved or cleared by the [place] FDA and  has been authorized for detection and/or diagnosis of SARS-CoV-2 by FDA under an Emergency Use Authorization (EUA). This EUA will remain   in effect (meaning this test can be used) for the duration of the COVID-19 declaration under Section 564(b)(1) of the Act, 21 U.S.C.section 360bbb-3(b)(1), unless the authorization is terminated  or revoked sooner.       Influenza A by PCR NEGATIVE NEGATIVE  Final   Influenza B by PCR NEGATIVE NEGATIVE Final    Comment: (NOTE) The Xpert Xpress SARS-CoV-2/FLU/RSV plus assay is intended as an aid in the diagnosis of influenza from Nasopharyngeal swab specimens and should not be used as a sole basis for treatment. Nasal washings and aspirates are unacceptable for Xpert Xpress SARS-CoV-2/FLU/RSV testing.  Fact Sheet for Patients: EntrepreneurPulse.com.au  Fact Sheet for Healthcare Providers: IncredibleEmployment.be  This test is not yet approved or cleared by the [place] FDA and has been authorized for detection and/or diagnosis of SARS-CoV-2 by FDA under an Emergency Use Authorization (EUA). This EUA will remain in effect (meaning this test can be used) for the duration of the COVID-19 declaration under Section 564(b)(1) of the Act, 21 U.S.C. section 360bbb-3(b)(1), unless the authorization is terminated or revoked.  Performed at [facility], [place] [address] [place], [place] [address]   Urine culture     Status: Abnormal   Collection Time: 10/13/20  5:06 PM   Specimen: Urine, Clean Catch  Result Value Ref Range Status   Specimen Description Urine  Final   Special Requests   Final    NONE Performed at [facility], [place] [address] [place], [place] [address]    Culture MULTIPLE SPECIES PRESENT, SUGGEST RECOLLECTION (A)  Final   Report Status 10/15/2020 FINAL  Final  Culture, blood (routine x 2)     Status: None (Preliminary result)   Collection Time: 10/13/20  8:43 PM   Specimen: BLOOD RIGHT HAND  Result Value Ref Range Status   Specimen Description BLOOD RIGHT HAND  Final   Special Requests   Final    BOTTLES DRAWN  AEROBIC AND ANAEROBIC Blood Culture results may not be optimal due to an inadequate volume of blood received in culture bottles   Culture   Final    NO GROWTH 2 DAYS Performed at [facility], [place] [address], [place], [place] [address]    Report Status PENDING  Incomplete  Culture, blood (routine x 2)     Status: None (Preliminary result)   Collection Time: 10/13/20  8:43 PM   Specimen: BLOOD LEFT FOREARM  Result Value Ref Range Status   Specimen Description BLOOD LEFT FOREARM  Final   Special Requests   Final    BOTTLES DRAWN AEROBIC AND ANAEROBIC Blood Culture results may not be optimal due to an inadequate volume of blood received in culture bottles   Culture   Final    NO GROWTH 2 DAYS Performed at [facility], [place] [address] [place], [place] [address]    Report Status PENDING  Incomplete      Radiology Studies: CT Renal Stone Study  Result Date: 10/13/2020 CLINICAL DATA:  Hematuria renal parenchymal disease EXAM: CT ABDOMEN AND PELVIS WITHOUT CONTRAST TECHNIQUE: Multidetector CT imaging of the abdomen and pelvis was performed following the standard protocol without IV contrast. COMPARISON:  CT 04/26/2019 FINDINGS: Lower chest: Lung bases demonstrate no acute consolidation or effusion. Mild cardiomegaly. Coronary calcifications. Hepatobiliary: No calcified gallstone. No focal hepatic abnormality or biliary dilatation. Pancreas: Unremarkable. No pancreatic ductal dilatation or surrounding inflammatory changes. Spleen: Normal in size without focal abnormality. Adrenals/Urinary Tract: Adrenal glands are within normal limits. 22 mm stone in the right renal pelvis with mild upper pole hydronephrosis. Numerous stones within the lower pole collecting system on the right. Multiple left-sided kidney stones, the largest is also seen in the left renal pelvis and measures 8 mm. 9 mm  hyperdense nodule upper pole right kidney, slightly more apparent. 14 mm hyperdense exophytic nodule  midpole right kidney without change. Cysts within the bilateral kidneys. Solid appearing mass exophytic to the lower pole left kidney measures 4.2 x 3.6 cm by 5.9 cm, previously 3.9 x 3.4 x 5.5 cm. Slightly thick-walled urinary bladder Stomach/Bowel: Stomach nonenlarged. No dilated small bowel. No acute bowel wall thickening. Diverticular disease of the left colon without acute inflammatory process. Vascular/Lymphatic: Moderate aortic atherosclerosis. No aneurysm. No increasing adenopathy. Stable 11 mm oval nodule adjacent to the left hepatic lobe. Reproductive: Status post hysterectomy. No adnexal masses. Other: Negative for free air or free fluid. Musculoskeletal: Left hip replacement with artifact. Scoliosis of the spine. 9 mm anterolisthesis L4 on L5. Advanced degenerative changes throughout the lumbar spine. IMPRESSION: 1. Numerous bilateral kidney stones, including a 22 mm right renal pelvic stone with mild upper pole hydronephrosis. 2. Slight interval increase in size of solid appearing mass lesion involving lower pole left kidney, now measuring up to 5.9 cm and suspicious for carcinoma. 3. Slightly thick-walled urinary bladder is questionable for mild cystitis 4. Extensive left colon diverticular disease without acute inflammation Electronically Signed   By: Donavan Foil M.D.   On: 10/13/2020 21:52      Scheduled Meds: . amLODipine  10 mg Oral Daily  . aspirin EC  81 mg Oral Daily  . cycloSPORINE  1 drop Both Eyes BID  . enoxaparin (LOVENOX) injection  30 mg Subcutaneous Q24H  . feeding supplement  237 mL Oral TID BM  . fluticasone furoate-vilanterol  1 puff Inhalation Daily  . multivitamin with minerals  1 tablet Oral Daily  . mupirocin ointment   Topical BID   Continuous Infusions: . cefTRIAXone (ROCEPHIN)  IV 1 g (10/14/20 2059)  . lactated ringers 125 mL/hr at 10/14/20 2140     LOS: 1 day      Time spent: 25 minutes   Dessa Phi, DO [facility] 10/15/2020, 10:47 AM    Available via Epic secure chat 7am-7pm After these hours, please refer to coverage provider listed on [email or web address]

## 2020-10-15 NOTE — Progress Notes (Signed)
Daily Progress Note   Patient Name: [name]       Date: 10/15/2020 DOB: 01-Nov-1926  Age: 87 y.o. MRN#: [id number] Attending Physician: Dessa Phi, DO Primary Care Physician: Hoyt Koch, MD Admit Date: 10/13/2020  Reason for Consultation/Follow-up: Establishing goals of care  Subjective: Patient sitting up in chair, confused, urinating frequently. Son and daughter at bedside.   Length of Stay: 1  Current Medications: Scheduled Meds:  . amLODipine  10 mg Oral Daily  . aspirin EC  81 mg Oral Daily  . cycloSPORINE  1 drop Both Eyes BID  . enoxaparin (LOVENOX) injection  30 mg Subcutaneous Q24H  . feeding supplement  237 mL Oral TID BM  . fluticasone furoate-vilanterol  1 puff Inhalation Daily  . multivitamin with minerals  1 tablet Oral Daily  . mupirocin ointment   Topical BID    Continuous Infusions: . cefTRIAXone (ROCEPHIN)  IV 1 g (10/14/20 2059)  . lactated ringers 125 mL/hr at 10/14/20 2140    PRN Meds: acetaminophen **OR** acetaminophen, albuterol, ondansetron **OR** ondansetron (ZOFRAN) IV  Physical Exam Constitutional:      General: She is not in acute distress. Pulmonary:     Effort: Pulmonary effort is normal.  Skin:    General: Skin is warm and dry.  Neurological:     Mental Status: She is alert. She is disoriented.             Vital Signs: BP 121/70 (BP Location: Left Arm)   Pulse 89   Temp 98 F (36.7 C) (Oral)   Resp 19   Wt 51.6 kg Comment: scale c  SpO2 96%   BMI 19.53 kg/m  SpO2: SpO2: 96 % O2 Device: O2 Device: Room Air O2 Flow Rate:    Intake/output summary:   Intake/Output Summary (Last 24 hours) at 10/15/2020 0954 Last data filed at 10/15/2020 4259 Gross per 24 hour  Intake 3169.98 ml  Output 1725 ml  Net 1444.98 ml   LBM: Last  BM Date: 10/13/20 Baseline Weight: Weight: 49.6 kg Most recent weight: Weight: 51.6 kg (scale c)       Palliative Assessment/Data: PPS 50%      Patient Active Problem List   Diagnosis Date Noted  . Acute lower UTI 10/14/2020  . Urinary urgency 10/13/2020  . AKI (acute kidney injury) ([place]) 10/13/2020  . Hyperkalemia 10/13/2020  . Left kidney mass 10/13/2020  . Severe protein-calorie malnutrition ([place]) 10/13/2020  . Advanced care planning/counseling discussion 07/17/2013  . Chronic low back pain 06/18/2012  . Routine health maintenance 06/18/2011  . COPD, mild ([place]) 11/21/2007  . Hyperlipidemia 05/29/2007  . Essential hypertension 05/29/2007  . Allergic rhinitis 05/29/2007    Palliative Care Assessment & Plan   HPI: 87 y.o. female  with past medical history of COPD, renal mass (no plans for work up or surgical intervention), HTN, HLD, colon polyps, hypokalemia admitted on 10/13/2020 with hyperkalemia and renal failure with findings of numerous bilateral kidney stones. Urology has weighed in and patient/family noted to be against any surgical/invasive intervention. Palliative care requested to assist with goals of care given functional decline at home and now declining renal function.   Assessment: Follow up today  with patient, daughter, and son.  We reviewed labs - daughter very pleased about improvements in kidney function, decreasing potassium. Daughter shares about difficulties overnight - patient up hourly to urinate and more confused.  Daughter shares that ultimate goal is for patient to return home however they fear they do not have enough support currently to get her home. We discuss home with home health vs home with hospice vs rehab stay. Discuss type of support provided by each. Daughter feels a stay at rehab first in an attempt to regain strength and to give them time to align care at home may be most appropriate. We reviewed patient's living will and HCPOA  documents. MOST form discussed - daughter will complete prior to dc.  Discussed daughter's concerns with TOC team.   Recommendations/Plan: Outpatient palliative to follow at SNF Eventually home with hospice following rehab stay To complete MOST prior to dc - will f/u in AM, living will sent to be scanned to [email or web address]  Code Status: DNR  Prognosis:  < 6 months  Discharge Planning: [facility] for rehab with Palliative care service follow-up  Care plan was discussed with daughter, son, [facility] team  Thank you for allowing the Palliative Medicine Team to assist in the care of this patient.   Total Time 50 minutes Prolonged Time Billed  no       Greater than 50%  of this time was spent counseling and coordinating care related to the above assessment and plan.  Juel Burrow, DNP, [facility] Palliative Medicine Team Team Phone # [phone]  Pager [phone]

## 2020-10-15 NOTE — Plan of Care (Signed)

## 2020-10-15 NOTE — Progress Notes (Signed)
MD, pt does not take Retasis anymore could u d/c, thanks [facility] [place]

## 2020-10-16 DIAGNOSIS — L988 Other specified disorders of the skin and subcutaneous tissue: Secondary | ICD-10-CM | POA: Diagnosis not present

## 2020-10-16 DIAGNOSIS — Z79899 Other long term (current) drug therapy: Secondary | ICD-10-CM | POA: Diagnosis not present

## 2020-10-16 DIAGNOSIS — S81802A Unspecified open wound, left lower leg, initial encounter: Secondary | ICD-10-CM | POA: Diagnosis not present

## 2020-10-16 DIAGNOSIS — J449 Chronic obstructive pulmonary disease, unspecified: Secondary | ICD-10-CM | POA: Diagnosis not present

## 2020-10-16 DIAGNOSIS — I2510 Atherosclerotic heart disease of native coronary artery without angina pectoris: Secondary | ICD-10-CM | POA: Diagnosis not present

## 2020-10-16 DIAGNOSIS — J90 Pleural effusion, not elsewhere classified: Secondary | ICD-10-CM | POA: Diagnosis not present

## 2020-10-16 DIAGNOSIS — E441 Mild protein-calorie malnutrition: Secondary | ICD-10-CM | POA: Diagnosis not present

## 2020-10-16 DIAGNOSIS — I4891 Unspecified atrial fibrillation: Secondary | ICD-10-CM | POA: Diagnosis not present

## 2020-10-16 DIAGNOSIS — R410 Disorientation, unspecified: Secondary | ICD-10-CM | POA: Diagnosis not present

## 2020-10-16 DIAGNOSIS — F0390 Unspecified dementia without behavioral disturbance: Secondary | ICD-10-CM | POA: Diagnosis not present

## 2020-10-16 DIAGNOSIS — E875 Hyperkalemia: Secondary | ICD-10-CM | POA: Diagnosis not present

## 2020-10-16 DIAGNOSIS — Z87440 Personal history of urinary (tract) infections: Secondary | ICD-10-CM | POA: Diagnosis not present

## 2020-10-16 DIAGNOSIS — L03116 Cellulitis of left lower limb: Secondary | ICD-10-CM | POA: Diagnosis not present

## 2020-10-16 DIAGNOSIS — J441 Chronic obstructive pulmonary disease with (acute) exacerbation: Secondary | ICD-10-CM | POA: Diagnosis not present

## 2020-10-16 DIAGNOSIS — E860 Dehydration: Secondary | ICD-10-CM | POA: Diagnosis not present

## 2020-10-16 DIAGNOSIS — R404 Transient alteration of awareness: Secondary | ICD-10-CM | POA: Diagnosis not present

## 2020-10-16 DIAGNOSIS — R2681 Unsteadiness on feet: Secondary | ICD-10-CM | POA: Diagnosis not present

## 2020-10-16 DIAGNOSIS — E46 Unspecified protein-calorie malnutrition: Secondary | ICD-10-CM | POA: Diagnosis not present

## 2020-10-16 DIAGNOSIS — Z7951 Long term (current) use of inhaled steroids: Secondary | ICD-10-CM | POA: Diagnosis not present

## 2020-10-16 DIAGNOSIS — L89152 Pressure ulcer of sacral region, stage 2: Secondary | ICD-10-CM | POA: Diagnosis not present

## 2020-10-16 DIAGNOSIS — R112 Nausea with vomiting, unspecified: Secondary | ICD-10-CM | POA: Diagnosis not present

## 2020-10-16 DIAGNOSIS — D72829 Elevated white blood cell count, unspecified: Secondary | ICD-10-CM | POA: Diagnosis not present

## 2020-10-16 DIAGNOSIS — N200 Calculus of kidney: Secondary | ICD-10-CM | POA: Diagnosis not present

## 2020-10-16 DIAGNOSIS — E785 Hyperlipidemia, unspecified: Secondary | ICD-10-CM | POA: Diagnosis not present

## 2020-10-16 DIAGNOSIS — I700 Atherosclerosis of aorta: Secondary | ICD-10-CM | POA: Diagnosis not present

## 2020-10-16 DIAGNOSIS — N2889 Other specified disorders of kidney and ureter: Secondary | ICD-10-CM | POA: Diagnosis not present

## 2020-10-16 DIAGNOSIS — Z66 Do not resuscitate: Secondary | ICD-10-CM | POA: Diagnosis not present

## 2020-10-16 DIAGNOSIS — H9193 Unspecified hearing loss, bilateral: Secondary | ICD-10-CM | POA: Diagnosis not present

## 2020-10-16 DIAGNOSIS — N179 Acute kidney failure, unspecified: Secondary | ICD-10-CM | POA: Diagnosis not present

## 2020-10-16 DIAGNOSIS — Z87891 Personal history of nicotine dependence: Secondary | ICD-10-CM | POA: Diagnosis not present

## 2020-10-16 DIAGNOSIS — D49512 Neoplasm of unspecified behavior of left kidney: Secondary | ICD-10-CM | POA: Diagnosis not present

## 2020-10-16 DIAGNOSIS — Z515 Encounter for palliative care: Secondary | ICD-10-CM | POA: Diagnosis not present

## 2020-10-16 DIAGNOSIS — Z7982 Long term (current) use of aspirin: Secondary | ICD-10-CM | POA: Diagnosis not present

## 2020-10-16 DIAGNOSIS — M6281 Muscle weakness (generalized): Secondary | ICD-10-CM | POA: Diagnosis not present

## 2020-10-16 DIAGNOSIS — Z7189 Other specified counseling: Secondary | ICD-10-CM | POA: Diagnosis not present

## 2020-10-16 DIAGNOSIS — I10 Essential (primary) hypertension: Secondary | ICD-10-CM | POA: Diagnosis not present

## 2020-10-16 DIAGNOSIS — R2243 Localized swelling, mass and lump, lower limb, bilateral: Secondary | ICD-10-CM | POA: Diagnosis not present

## 2020-10-16 DIAGNOSIS — R279 Unspecified lack of coordination: Secondary | ICD-10-CM | POA: Diagnosis not present

## 2020-10-16 DIAGNOSIS — J309 Allergic rhinitis, unspecified: Secondary | ICD-10-CM | POA: Diagnosis not present

## 2020-10-16 DIAGNOSIS — I447 Left bundle-branch block, unspecified: Secondary | ICD-10-CM | POA: Diagnosis not present

## 2020-10-16 DIAGNOSIS — L89302 Pressure ulcer of unspecified buttock, stage 2: Secondary | ICD-10-CM | POA: Diagnosis not present

## 2020-10-16 DIAGNOSIS — Z96642 Presence of left artificial hip joint: Secondary | ICD-10-CM | POA: Diagnosis not present

## 2020-10-16 DIAGNOSIS — E43 Unspecified severe protein-calorie malnutrition: Secondary | ICD-10-CM | POA: Diagnosis not present

## 2020-10-16 DIAGNOSIS — N390 Urinary tract infection, site not specified: Secondary | ICD-10-CM | POA: Diagnosis not present

## 2020-10-16 DIAGNOSIS — R4182 Altered mental status, unspecified: Secondary | ICD-10-CM | POA: Diagnosis not present

## 2020-10-16 DIAGNOSIS — K5730 Diverticulosis of large intestine without perforation or abscess without bleeding: Secondary | ICD-10-CM | POA: Diagnosis not present

## 2020-10-16 LAB — BASIC METABOLIC PANEL
Anion gap: 6 (ref 5–15)
BUN: 36 mg/dL — ABNORMAL HIGH (ref 8–23)
CO2: 25 mmol/L (ref 22–32)
Calcium: 8.9 mg/dL (ref 8.9–10.3)
Chloride: 106 mmol/L (ref 98–111)
Creatinine, Ser: 1.7 mg/dL — ABNORMAL HIGH (ref 0.44–1.00)
GFR, Estimated: 28 mL/min — ABNORMAL LOW (ref >60)
Glucose, Bld: 79 mg/dL (ref 70–99)
Potassium: 3.8 mmol/L (ref 3.5–5.1)
Sodium: 137 mmol/L (ref 135–145)

## 2020-10-16 MED ORDER — ENSURE ENLIVE PO LIQD: 237.0000 mL | Freq: Three times a day (TID) | ORAL | 12 refills | Status: AC

## 2020-10-16 MED ORDER — ACETAMINOPHEN 325 MG PO TABS: 650.0000 mg | ORAL_TABLET | Freq: Four times a day (QID) | ORAL | Status: AC | PRN

## 2020-10-16 NOTE — Progress Notes (Signed)
[facility]) Outpatient Palliative Care Services  Referral received for outpatient palliative care services once she is d/c'd.  [facility] will follow.  Venia Carbon RN, BSN, [facility] Liaison

## 2020-10-16 NOTE — Progress Notes (Signed)
Daily Progress Note   Patient Name: [name]       Date: 10/16/2020 DOB: Oct 18, 1926  Age: 87 y.o. MRN#: [id number] Attending Physician: Lorella Nimrod, MD Primary Care Physician: Hoyt Koch, MD Admit Date: 10/13/2020  Reason for Consultation/Follow-up: Establishing goals of care  Subjective: C/o feeling cold but tells me she feels alright otherwise.  Length of Stay: 2  Current Medications: Scheduled Meds:  . amLODipine  10 mg Oral Daily  . aspirin EC  81 mg Oral Daily  . cycloSPORINE  1 drop Both Eyes BID  . enoxaparin (LOVENOX) injection  30 mg Subcutaneous Q24H  . feeding supplement  237 mL Oral TID BM  . fluticasone furoate-vilanterol  1 puff Inhalation Daily  . multivitamin with minerals  1 tablet Oral Daily  . mupirocin ointment   Topical BID    Continuous Infusions: . cefTRIAXone (ROCEPHIN)  IV 1 g (10/15/20 2139)  . lactated ringers 125 mL/hr at 10/16/20 0756    PRN Meds: acetaminophen **OR** acetaminophen, albuterol, ondansetron **OR** ondansetron (ZOFRAN) IV  Physical Exam          Vital Signs: BP 93/60 (BP Location: Left Arm)   Pulse 95   Temp 97.9 F (36.6 C)   Resp 16   Wt 52.4 kg   SpO2 96%   BMI 19.83 kg/m  SpO2: SpO2: 96 % O2 Device: O2 Device: Room Air O2 Flow Rate:    Intake/output summary:   Intake/Output Summary (Last 24 hours) at 10/16/2020 1348 Last data filed at 10/16/2020 8786 Gross per 24 hour  Intake 1926.24 ml  Output 725 ml  Net 1201.24 ml   LBM: Last BM Date: 10/13/20 Baseline Weight: Weight: 49.6 kg Most recent weight: Weight: 52.4 kg       Palliative Assessment/Data:    Flowsheet Rows   Flowsheet Row Most Recent Value  Intake Tab   Referral Department Hospitalist  Unit at Time of Referral Cardiac/Telemetry Unit   Palliative Care Primary Diagnosis Sepsis/Infectious Disease  Date Notified 10/14/20  Palliative Care Type New Palliative care  Reason for referral Clarify Goals of Care  Date of Admission 10/13/20  Date first seen by Palliative Care 10/14/20  # of days Palliative referral response time 0 Day(s)  # of days IP prior to Palliative referral 1  Clinical Assessment   Palliative Performance Scale Score 50%  Psychosocial & Spiritual Assessment   Palliative Care Outcomes   Patient/Family meeting held? Yes  Who was at the meeting? son and daughter  Palliative Care Outcomes Clarified goals of care      Patient Active Problem List   Diagnosis Date Noted  . Acute lower UTI 10/14/2020  . Urinary urgency 10/13/2020  . AKI (acute kidney injury) ([place]) 10/13/2020  . Hyperkalemia 10/13/2020  . Left kidney mass 10/13/2020  . Severe protein-calorie malnutrition ([place]) 10/13/2020  . Advanced care planning/counseling discussion 07/17/2013  . Chronic low back pain 06/18/2012  . Routine health maintenance 06/18/2011  . COPD, mild ([place]) 11/21/2007  . Hyperlipidemia 05/29/2007  . Essential hypertension 05/29/2007  . Allergic rhinitis 05/29/2007    Palliative Care Assessment & Plan   HPI: 87 y.o.femalewith past medical history of COPD, renal mass (no  plans for work up or surgical intervention), HTN, HLD, colon polyps, hypokalemiaadmitted on 5/3/2022with hyperkalemia and renal failure with findings of numerous bilateral kidney stones.Urology has weighed in and patient/family noted to be against any surgical/invasive intervention. Palliative care requested to assist with goals of care given functional decline at home and now declining renal function.  Assessment: Plans for dc today with SNF with palliative to follow. Family likely to involve hospice following rehab stay.   I completed a MOST form today. The patient and family outlined their wishes for the following treatment  decisions:  Cardiopulmonary Resuscitation: Do Not Attempt Resuscitation (DNR/No CPR)  Medical Interventions: Limited Additional Interventions: Use medical treatment, IV fluids and cardiac monitoring as indicated, DO NOT USE intubation or mechanical ventilation. May consider use of less invasive airway support such as BiPAP or CPAP. Also provide comfort measures. Transfer to the hospital if indicated. Avoid intensive care.   Antibiotics: Determine use of limitation of antibiotics when infection occurs  IV Fluids: IV fluids for a defined trial period  Feeding Tube: No feeding tube   MOST signed and sent to epic/vynca. Copy given to family.   Recommendations/Plan: Dc today to SNF with OP palliative MOST completed Eventually home with hospice following rehab stay  Code Status: DNR  Prognosis:  < 6 months  Discharge Planning: [place] for rehab with Palliative care service follow-up  Care plan was discussed with son, [facility] team  Thank you for allowing the Palliative Medicine Team to assist in the care of this patient.   Total Time 25 minutes Prolonged Time Billed  no       Greater than 50%  of this time was spent counseling and coordinating care related to the above assessment and plan.  Juel Burrow, DNP, [facility] Palliative Medicine Team Team Phone # [phone]  Pager [phone]

## 2020-10-16 NOTE — Consult Note (Signed)
[facility] Inpatient Consult   10/16/2020  [name] [facility] 1926/09/11 [id number]  [facility] [[facility]]  Accountable Care Organization [ACO] Patient: Medicare CMS DCE  Plan: To have patient  followed by [facility] [[facility] Care Management PAC with traditional Medicare for any known or needs for transitional care needs for returning to post facility care or complex disease management.  Patient transitioned to [facility] and with Palliative consult noted.  For questions or referrals, please contact:   Charlesetta Shanks, RN BSN CCM [facility]  [phone] business mobile phone Toll free office [phone]  Fax number: [phone] [email or web address]

## 2020-10-16 NOTE — TOC Transition Note (Addendum)
Transition of Care [facility]) - CM/SW Discharge Note   Patient Details  Name: [name] MRN: [id number] Date of Birth: 10-23-1926  Transition of Care [facility]) CM/SW Contact:  Tresa Endo Phone Number: 10/16/2020, 11:54 AM   Clinical Narrative:    Patient will DC to: [facility] Anticipated DC date: 10/16/2020 Family notified: Pt daughter Transport by: Pt daughter and son   Per MD patient ready for DC to [facility] bed 214. RN to call report prior to discharge ( [phone]). RN, patient, patient's family, and facility notified of DC. Discharge Summary and FL2 sent to facility. DC packet on chart. Ambulance transport requested for patient.   CSW will sign off for now as social work intervention is no longer needed. Please consult [place] again if new needs arise.        Barriers to Discharge: SNF Pending bed offer   Patient Goals and CMS Choice Patient states their goals for this hospitalization and ongoing recovery are:: would like skilled care then dc home with hospice CMS Medicare.gov Compare Post Acute Care list provided to:: Patient Represenative (must comment) (daughter, Rosann Auerbach) Choice offered to / list presented to : Adult Children  Discharge Placement                       Discharge Plan and Services In-house Referral: Clinical Social Work,Hospice / Palliative Care Discharge Planning Services: CM Consult Post Acute Care Choice: [place]          DME Arranged: N/A DME Agency: NA         HH Agency: NA        Social Determinants of Health (SDOH) Interventions     Readmission Risk Interventions No flowsheet data found.

## 2020-10-16 NOTE — Discharge Summary (Signed)
Physician Discharge Summary  [facility] WUJ:[id number] DOB: [birth year] DOA: 10/13/2020  PCP: Hoyt Koch, MD  Admit date: 10/13/2020 Discharge date: 10/16/2020  Admitted From: Home Disposition: SNF  Recommendations for Outpatient Follow-up:  1. Follow up with PCP in 1-2 weeks 2. Follow-up with urology 3. Please obtain BMP/CBC in one week 4. Please follow up on the following pending results: None  Home Health: No Equipment/Devices: Discharge Condition: Stable CODE STATUS: DNR Diet recommendation: Heart Healthy   Brief/Interim Summary: [name] is a29 y.o.femalewith medical history significant ofallergy rhinitis, COPD, reactive airway disease, colon polyps, diverticulosis, hyperlipidemia, hypertension, hypokalemia, unspecified radiculopathy who was sent to the emergency department by her primary care physician due to abnormal labs showing AKI and hyperkalemia. The patient's daughter stated that she has been treated twice with oral antibiotics over the last month for UTI without significant results. The patient has been having decreased oral intake for the past few weeks, she has been sleeping more than usual and her mentation seems to be decreased particularly since Saturday.   CT renal study showed numerous bilateral kidney stones including a 22 mm right renal pelvic stone with mild upper pole hydronephrosis. There is a slight interval increase in size of solid-appearing mass lesion on the lower pole of the left kidney.   AKI was thought to be multifactorial with decreased p.o. intake, Bactrim use and partially obstructing component of large renal stone and renal mass.  Urine cultures with multiple species and she did receive 3 days of ceftriaxone while in the hospital.  No need to continue antibiotics at this time.  Neurology was also consulted but due to the lack of high-grade obstruction involving the right kidney ureteral stent was not advised and patient will  follow-up with urology as an outpatient for further recommendations. Her renal function improved with IV hydration, patient was encouraged to keep herself well-hydrated.  Her hyperkalemia resolved.  Palliative care was also involved due to her advanced age and multiple comorbidities with this new possible diagnosis of renal mass.  Patient does not want any aggressive treatment.  She is being discharged to SNF for rehab and will follow up with palliative care as an outpatient, potentially can go home with hospice if needed.  Patient will continue rest of her home medications and follow-up with her providers.  Discharge Diagnoses:  Principal Problem:   AKI (acute kidney injury) ([place]) Active Problems:   Hyperlipidemia   Essential hypertension   COPD, mild (HCC)   Hyperkalemia   Left kidney mass   Severe protein-calorie malnutrition ([place])   Acute lower UTI   Discharge Instructions  Discharge Instructions    Diet - low sodium heart healthy   Complete by: As directed    Discharge wound care:   Complete by: As directed    Change dressing as needed.   Increase activity slowly   Complete by: As directed      Allergies as of 10/16/2020      Reactions   Prednisone    Patient became jittery all over   Codeine       Medication List    STOP taking these medications   naproxen sodium 220 MG tablet Commonly known as: ALEVE   sulfamethoxazole-trimethoprim 800-160 MG tablet Commonly known as: BACTRIM DS     TAKE these medications   acetaminophen 325 MG tablet Commonly known as: TYLENOL Take 2 tablets (650 mg total) by mouth every 6 (six) hours as needed for mild pain (or Fever >/=  101).   amLODipine 10 MG tablet Commonly known as: NORVASC Take 1 tablet (10 mg total) by mouth daily.   aspirin EC 81 MG tablet Take 81 mg by mouth daily. Swallow whole.   Breo Ellipta 100-25 MCG/INH Aepb Generic drug: fluticasone furoate-vilanterol Inhale 1 puff into the lungs 2 (two) times  daily.   Centrum Silver tablet Take 1 tablet by mouth daily.   feeding supplement Liqd Take 237 mLs by mouth 3 (three) times daily between meals.   ProAir HFA 108 (90 Base) MCG/ACT inhaler Generic drug: albuterol INHALE 2 PUFFS INTO THE LUNGS EVERY 6 HOURS AS NEEDED FOR WHEEZING ORSHORTNESS OF BREATH What changed:   how much to take  how to take this  when to take this  reasons to take this  additional instructions   Restasis 0.05 % ophthalmic emulsion Generic drug: cycloSPORINE Apply to eye 2 (two) times daily.            Discharge Care Instructions  (From admission, onward)         Start     Ordered   10/16/20 0000  Discharge wound care:       Comments: Change dressing as needed.   10/16/20 1202          Contact information for follow-up providers    Ceasar Mons, MD In 3 weeks.   Specialty: Urology Why: Call office for appointment  Contact information: [place] [address] [phone]        Hoyt Koch, MD. Schedule an appointment as soon as possible for a visit.   Specialty: Internal Medicine Contact information: [place] [address] [phone]            Contact information for after-discharge care    Destination    [facility] Preferred SNF .   Service: Skilled Nursing Contact information: [place] [phone]                 Allergies  Allergen Reactions  . Prednisone     Patient became jittery all over  . Codeine     Consultations:  Urology  Palliative care  Procedures/Studies: CT Renal Stone Study  Result Date: 10/13/2020 CLINICAL DATA:  Hematuria renal parenchymal disease EXAM: CT ABDOMEN AND PELVIS WITHOUT CONTRAST TECHNIQUE: Multidetector CT imaging of the abdomen and pelvis was performed following the standard protocol without IV contrast. COMPARISON:  CT 04/26/2019  FINDINGS: Lower chest: Lung bases demonstrate no acute consolidation or effusion. Mild cardiomegaly. Coronary calcifications. Hepatobiliary: No calcified gallstone. No focal hepatic abnormality or biliary dilatation. Pancreas: Unremarkable. No pancreatic ductal dilatation or surrounding inflammatory changes. Spleen: Normal in size without focal abnormality. Adrenals/Urinary Tract: Adrenal glands are within normal limits. 22 mm stone in the right renal pelvis with mild upper pole hydronephrosis. Numerous stones within the lower pole collecting system on the right. Multiple left-sided kidney stones, the largest is also seen in the left renal pelvis and measures 8 mm. 9 mm hyperdense nodule upper pole right kidney, slightly more apparent. 14 mm hyperdense exophytic nodule midpole right kidney without change. Cysts within the bilateral kidneys. Solid appearing mass exophytic to the lower pole left kidney measures 4.2 x 3.6 cm by 5.9 cm, previously 3.9 x 3.4 x 5.5 cm. Slightly thick-walled urinary bladder Stomach/Bowel: Stomach nonenlarged. No dilated small bowel. No acute bowel wall thickening. Diverticular disease of the left colon without acute inflammatory process. Vascular/Lymphatic:  Moderate aortic atherosclerosis. No aneurysm. No increasing adenopathy. Stable 11 mm oval nodule adjacent to the left hepatic lobe. Reproductive: Status post hysterectomy. No adnexal masses. Other: Negative for free air or free fluid. Musculoskeletal: Left hip replacement with artifact. Scoliosis of the spine. 9 mm anterolisthesis L4 on L5. Advanced degenerative changes throughout the lumbar spine. IMPRESSION: 1. Numerous bilateral kidney stones, including a 22 mm right renal pelvic stone with mild upper pole hydronephrosis. 2. Slight interval increase in size of solid appearing mass lesion involving lower pole left kidney, now measuring up to 5.9 cm and suspicious for carcinoma. 3. Slightly thick-walled urinary bladder is questionable  for mild cystitis 4. Extensive left colon diverticular disease without acute inflammation Electronically Signed   By: Donavan Foil M.D.   On: 10/13/2020 21:52     Subjective: Patient was seen and examined today.  Appears little lethargic and sleepy, son at bedside.  Denies any pain or shortness of breath.  Discussed about p.o. hydration, per son they are working on it  Discharge Exam: Vitals:   10/16/20 0754 10/16/20 1053  BP:  93/60  Pulse:  95  Resp:  16  Temp:  97.9 F (36.6 C)  SpO2: 97% 96%   Vitals:   10/15/20 2118 10/16/20 0417 10/16/20 0754 10/16/20 1053  BP: 116/70 (!) 111/58  93/60  Pulse: 86 95  95  Resp: 16 16  16   Temp: 98.4 F (36.9 C) (!) 97.5 F (36.4 C)  97.9 F (36.6 C)  TempSrc: Oral Oral    SpO2: 97% 97% 97% 96%  Weight:  52.4 kg      General: Pt is alert, awake, not in acute distress Cardiovascular: RRR, S1/S2 +, no rubs, no gallops Respiratory: CTA bilaterally, no wheezing, no rhonchi Abdominal: Soft, NT, ND, bowel sounds + Extremities: 1+ LE edema around ankles, no cyanosis   The results of significant diagnostics from this hospitalization (including imaging, microbiology, ancillary and laboratory) are listed below for reference.    Microbiology: Recent Results (from the past 240 hour(s))  Urine Culture     Status: None   Collection Time: 10/13/20 10:37 AM   Specimen: Urine  Result Value Ref Range Status   Source: NOT GIVEN  Final   Status: FINAL  Final   Isolate 1:   Final    Mixed genital flora isolated. These superficial bacteria are not indicative of a urinary tract infection. No further organism identification is warranted on this specimen. If clinically indicated, recollect clean-catch, mid-stream urine and transfer  immediately to Urine Culture Transport Tube.   Resp Panel by RT-PCR (Flu A&B, Covid) Nasopharyngeal Swab     Status: None   Collection Time: 10/13/20  3:42 PM   Specimen: Nasopharyngeal Swab; Nasopharyngeal(NP) swabs in  vial transport medium  Result Value Ref Range Status   SARS Coronavirus 2 by RT PCR NEGATIVE NEGATIVE Final    Comment: (NOTE) SARS-CoV-2 target nucleic acids are NOT DETECTED.  The SARS-CoV-2 RNA is generally detectable in upper respiratory specimens during the acute phase of infection. The lowest concentration of SARS-CoV-2 viral copies this assay can detect is 138 copies/mL. A negative result does not preclude SARS-Cov-2 infection and should not be used as the sole basis for treatment or other patient management decisions. A negative result may occur with  improper specimen collection/handling, submission of specimen other than nasopharyngeal swab, presence of viral mutation(s) within the areas targeted by this assay, and inadequate number of viral copies(<138 copies/mL). A negative result must be  combined with clinical observations, patient history, and epidemiological information. The expected result is Negative.  Fact Sheet for Patients:  EntrepreneurPulse.com.au  Fact Sheet for Healthcare Providers:  IncredibleEmployment.be  This test is no t yet approved or cleared by the [place] FDA and  has been authorized for detection and/or diagnosis of SARS-CoV-2 by FDA under an Emergency Use Authorization (EUA). This EUA will remain  in effect (meaning this test can be used) for the duration of the COVID-19 declaration under Section 564(b)(1) of the Act, 21 U.S.C.section 360bbb-3(b)(1), unless the authorization is terminated  or revoked sooner.       Influenza A by PCR NEGATIVE NEGATIVE Final   Influenza B by PCR NEGATIVE NEGATIVE Final    Comment: (NOTE) The Xpert Xpress SARS-CoV-2/FLU/RSV plus assay is intended as an aid in the diagnosis of influenza from Nasopharyngeal swab specimens and should not be used as a sole basis for treatment. Nasal washings and aspirates are unacceptable for Xpert Xpress SARS-CoV-2/FLU/RSV testing.  Fact  Sheet for Patients: EntrepreneurPulse.com.au  Fact Sheet for Healthcare Providers: IncredibleEmployment.be  This test is not yet approved or cleared by the [place] FDA and has been authorized for detection and/or diagnosis of SARS-CoV-2 by FDA under an Emergency Use Authorization (EUA). This EUA will remain in effect (meaning this test can be used) for the duration of the COVID-19 declaration under Section 564(b)(1) of the Act, 21 U.S.C. section 360bbb-3(b)(1), unless the authorization is terminated or revoked.  Performed at [facility], [place] [address] [place], [place] [address]   Urine culture     Status: Abnormal   Collection Time: 10/13/20  5:06 PM   Specimen: Urine, Clean Catch  Result Value Ref Range Status   Specimen Description Urine  Final   Special Requests   Final    NONE Performed at [facility], [place] [address], [place], [place] [address]    Culture MULTIPLE SPECIES PRESENT, SUGGEST RECOLLECTION (A)  Final   Report Status 10/15/2020 FINAL  Final  Culture, blood (routine x 2)     Status: None (Preliminary result)   Collection Time: 10/13/20  8:43 PM   Specimen: BLOOD RIGHT HAND  Result Value Ref Range Status   Specimen Description BLOOD RIGHT HAND  Final   Special Requests   Final    BOTTLES DRAWN AEROBIC AND ANAEROBIC Blood Culture results may not be optimal due to an inadequate volume of blood received in culture bottles   Culture   Final    NO GROWTH 3 DAYS Performed at [facility], [place] [address], [place], [place] [address]    Report Status PENDING  Incomplete  Culture, blood (routine x 2)     Status: None (Preliminary result)   Collection Time: 10/13/20  8:43 PM   Specimen: BLOOD LEFT FOREARM  Result Value Ref Range Status   Specimen Description BLOOD LEFT FOREARM  Final   Special Requests   Final    BOTTLES DRAWN AEROBIC AND ANAEROBIC Blood Culture results may not be optimal due to an  inadequate volume of blood received in culture bottles   Culture   Final    NO GROWTH 3 DAYS Performed at [facility], [place] [address] [place], [place] [address]    Report Status PENDING  Incomplete  SARS CORONAVIRUS 2 (TAT 6-24 HRS) Nasopharyngeal Nasopharyngeal Swab     Status: None   Collection Time: 10/15/20 12:00 PM   Specimen: Nasopharyngeal Swab  Result Value Ref  Range Status   SARS Coronavirus 2 NEGATIVE NEGATIVE Final    Comment: (NOTE) SARS-CoV-2 target nucleic acids are NOT DETECTED.  The SARS-CoV-2 RNA is generally detectable in upper and lower respiratory specimens during the acute phase of infection. Negative results do not preclude SARS-CoV-2 infection, do not rule out co-infections with other pathogens, and should not be used as the sole basis for treatment or other patient management decisions. Negative results must be combined with clinical observations, patient history, and epidemiological information. The expected result is Negative.  Fact Sheet for Patients: SugarRoll.be  Fact Sheet for Healthcare Providers: https://www.woods-mathews.com/  This test is not yet approved or cleared by the [place] FDA and  has been authorized for detection and/or diagnosis of SARS-CoV-2 by FDA under an Emergency Use Authorization (EUA). This EUA will remain  in effect (meaning this test can be used) for the duration of the COVID-19 declaration under Se ction 564(b)(1) of the Act, 21 U.S.C. section 360bbb-3(b)(1), unless the authorization is terminated or revoked sooner.  Performed at [facility], [place] [address], [place], [place] [address]      Labs: BNP (last 3 results) No results for input(s): BNP in the last 8760 hours. Basic Metabolic Panel: Recent Labs  Lab 10/13/20 1029 10/13/20 1942 10/13/20 2058 10/14/20 0501 10/14/20 1433 10/15/20 0311 10/16/20 0450  NA 131* 133* 133* 132*  --  137 137  K 6.5*  6.4* 6.3* 6.0* 4.7 4.5 3.8  CL 101 106 112* 107  --  105 106  CO2 23 20*  --  19*  --  24 25  GLUCOSE 146* 96 94 115*  --  77 79  BUN 82* 78* 71* 69*  --  51* 36*  CREATININE 3.33* 3.26* 3.50* 3.02*  --  2.09* 1.70*  CALCIUM 9.9 9.5  --  9.1  --  9.2 8.9   Liver Function Tests: Recent Labs  Lab 10/13/20 1029 10/13/20 1942  AST 18 18  ALT 11 14  ALKPHOS 92 79  BILITOT 0.4 0.8  PROT 6.9 6.4*  ALBUMIN 3.2* 2.6*   No results for input(s): LIPASE, AMYLASE in the last 168 hours. No results for input(s): AMMONIA in the last 168 hours. CBC: Recent Labs  Lab 10/13/20 1029 10/13/20 1542 10/13/20 2058 10/14/20 0501 10/15/20 0311  WBC 10.5 12.2*  --  11.2* 9.6  NEUTROABS  --  8.2*  --   --   --   HGB 13.3 13.4 12.9 11.8* 11.8*  HCT 40.2 41.6 38.0 36.9 37.1  MCV 92.9 95.2  --  95.8 96.6  PLT 425.0* 424*  --  308 343   Cardiac Enzymes: No results for input(s): CKTOTAL, CKMB, CKMBINDEX, TROPONINI in the last 168 hours. BNP: Invalid input(s): POCBNP CBG: No results for input(s): GLUCAP in the last 168 hours. D-Dimer No results for input(s): DDIMER in the last 72 hours. Hgb A1c No results for input(s): HGBA1C in the last 72 hours. Lipid Profile No results for input(s): CHOL, HDL, LDLCALC, TRIG, CHOLHDL, LDLDIRECT in the last 72 hours. Thyroid function studies No results for input(s): TSH, T4TOTAL, T3FREE, THYROIDAB in the last 72 hours.  Invalid input(s): FREET3 Anemia work up No results for input(s): VITAMINB12, FOLATE, FERRITIN, TIBC, IRON, RETICCTPCT in the last 72 hours. Urinalysis    Component Value Date/Time   COLORURINE YELLOW 10/13/2020 1553   APPEARANCEUR TURBID (A) 10/13/2020 1553   LABSPEC 1.013 10/13/2020 1553   PHURINE 5.0 10/13/2020 1553   GLUCOSEU NEGATIVE 10/13/2020 1553  GLUCOSEU Negative 10/13/2020 1037   HGBUR SMALL (A) 10/13/2020 1553   BILIRUBINUR NEGATIVE 10/13/2020 1553   BILIRUBINUR Negative 10/13/2020 1018   KETONESUR NEGATIVE 10/13/2020  1553   PROTEINUR 100 (A) 10/13/2020 1553   UROBILINOGEN 0.2 mg/dL (A) 10/13/2020 1037   UROBILINOGEN 0.2 10/13/2020 1018   NITRITE NEGATIVE 10/13/2020 1553   LEUKOCYTESUR MODERATE (A) 10/13/2020 1553   Sepsis Labs Invalid input(s): PROCALCITONIN,  WBC,  LACTICIDVEN Microbiology Recent Results (from the past 240 hour(s))  Urine Culture     Status: None   Collection Time: 10/13/20 10:37 AM   Specimen: Urine  Result Value Ref Range Status   Source: NOT GIVEN  Final   Status: FINAL  Final   Isolate 1:   Final    Mixed genital flora isolated. These superficial bacteria are not indicative of a urinary tract infection. No further organism identification is warranted on this specimen. If clinically indicated, recollect clean-catch, mid-stream urine and transfer  immediately to Urine Culture Transport Tube.   Resp Panel by RT-PCR (Flu A&B, Covid) Nasopharyngeal Swab     Status: None   Collection Time: 10/13/20  3:42 PM   Specimen: Nasopharyngeal Swab; Nasopharyngeal(NP) swabs in vial transport medium  Result Value Ref Range Status   SARS Coronavirus 2 by RT PCR NEGATIVE NEGATIVE Final    Comment: (NOTE) SARS-CoV-2 target nucleic acids are NOT DETECTED.  The SARS-CoV-2 RNA is generally detectable in upper respiratory specimens during the acute phase of infection. The lowest concentration of SARS-CoV-2 viral copies this assay can detect is 138 copies/mL. A negative result does not preclude SARS-Cov-2 infection and should not be used as the sole basis for treatment or other patient management decisions. A negative result may occur with  improper specimen collection/handling, submission of specimen other than nasopharyngeal swab, presence of viral mutation(s) within the areas targeted by this assay, and inadequate number of viral copies(<138 copies/mL). A negative result must be combined with clinical observations, patient history, and epidemiological information. The expected result is  Negative.  Fact Sheet for Patients:  EntrepreneurPulse.com.au  Fact Sheet for Healthcare Providers:  IncredibleEmployment.be  This test is no t yet approved or cleared by the [place] FDA and  has been authorized for detection and/or diagnosis of SARS-CoV-2 by FDA under an Emergency Use Authorization (EUA). This EUA will remain  in effect (meaning this test can be used) for the duration of the COVID-19 declaration under Section 564(b)(1) of the Act, 21 U.S.C.section 360bbb-3(b)(1), unless the authorization is terminated  or revoked sooner.       Influenza A by PCR NEGATIVE NEGATIVE Final   Influenza B by PCR NEGATIVE NEGATIVE Final    Comment: (NOTE) The Xpert Xpress SARS-CoV-2/FLU/RSV plus assay is intended as an aid in the diagnosis of influenza from Nasopharyngeal swab specimens and should not be used as a sole basis for treatment. Nasal washings and aspirates are unacceptable for Xpert Xpress SARS-CoV-2/FLU/RSV testing.  Fact Sheet for Patients: EntrepreneurPulse.com.au  Fact Sheet for Healthcare Providers: IncredibleEmployment.be  This test is not yet approved or cleared by the [place] FDA and has been authorized for detection and/or diagnosis of SARS-CoV-2 by FDA under an Emergency Use Authorization (EUA). This EUA will remain in effect (meaning this test can be used) for the duration of the COVID-19 declaration under Section 564(b)(1) of the Act, 21 U.S.C. section 360bbb-3(b)(1), unless the authorization is terminated or revoked.  Performed at [facility], [place] [address] [place], [place] [address]  Urine culture     Status: Abnormal   Collection Time: 10/13/20  5:06 PM   Specimen: Urine, Clean Catch  Result Value Ref Range Status   Specimen Description Urine  Final   Special Requests   Final    NONE Performed at [facility], [address], [place], [place]  [address]    Culture MULTIPLE SPECIES PRESENT, SUGGEST RECOLLECTION (A)  Final   Report Status 10/15/2020 FINAL  Final  Culture, blood (routine x 2)     Status: None (Preliminary result)   Collection Time: 10/13/20  8:43 PM   Specimen: BLOOD RIGHT HAND  Result Value Ref Range Status   Specimen Description BLOOD RIGHT HAND  Final   Special Requests   Final    BOTTLES DRAWN AEROBIC AND ANAEROBIC Blood Culture results may not be optimal due to an inadequate volume of blood received in culture bottles   Culture   Final    NO GROWTH 3 DAYS Performed at [facility], [place] [address], [place], [place] [address]    Report Status PENDING  Incomplete  Culture, blood (routine x 2)     Status: None (Preliminary result)   Collection Time: 10/13/20  8:43 PM   Specimen: BLOOD LEFT FOREARM  Result Value Ref Range Status   Specimen Description BLOOD LEFT FOREARM  Final   Special Requests   Final    BOTTLES DRAWN AEROBIC AND ANAEROBIC Blood Culture results may not be optimal due to an inadequate volume of blood received in culture bottles   Culture   Final    NO GROWTH 3 DAYS Performed at [facility], [place] [address], [place], [place] [address]    Report Status PENDING  Incomplete  SARS CORONAVIRUS 2 (TAT 6-24 HRS) Nasopharyngeal Nasopharyngeal Swab     Status: None   Collection Time: 10/15/20 12:00 PM   Specimen: Nasopharyngeal Swab  Result Value Ref Range Status   SARS Coronavirus 2 NEGATIVE NEGATIVE Final    Comment: (NOTE) SARS-CoV-2 target nucleic acids are NOT DETECTED.  The SARS-CoV-2 RNA is generally detectable in upper and lower respiratory specimens during the acute phase of infection. Negative results do not preclude SARS-CoV-2 infection, do not rule out co-infections with other pathogens, and should not be used as the sole basis for treatment or other patient management decisions. Negative results must be combined with clinical observations, patient history, and  epidemiological information. The expected result is Negative.  Fact Sheet for Patients: SugarRoll.be  Fact Sheet for Healthcare Providers: https://www.woods-mathews.com/  This test is not yet approved or cleared by the [place] FDA and  has been authorized for detection and/or diagnosis of SARS-CoV-2 by FDA under an Emergency Use Authorization (EUA). This EUA will remain  in effect (meaning this test can be used) for the duration of the COVID-19 declaration under Se ction 564(b)(1) of the Act, 21 U.S.C. section 360bbb-3(b)(1), unless the authorization is terminated or revoked sooner.  Performed at [facility], [place] [address], [place], [place] [address]     Time coordinating discharge: Over 30 minutes  SIGNED:  Lorella Nimrod, MD  [facility] 10/16/2020, 12:05 PM  If 7PM-7AM, please contact night-coverage [email or web address]  This record has been created using Systems analyst. Errors have been sought and corrected,but may not always be located. Such creation errors do not reflect on the standard of care.

## 2020-10-16 NOTE — Plan of Care (Signed)

## 2020-10-16 NOTE — TOC Progression Note (Addendum)
Transition of Care [facility]) - Progression Note    Patient Details  Name: [name] MRN: [id number] Date of Birth: 08-23-1926  Transition of Care [facility]) CM/SW Contact  Reece Agar, [place] Phone Number: 10/16/2020, 9:41 AM  Clinical Narrative:    CSW spoke with pt daughter on 10/15/20 confirming priority facility as [facility], CSW explained process of getting into a SNF. CSW contacted [facility] to see if they could offer bed, Tracey at [facility] will look over pt info and contact CSW back. CSW also asked about a waiver for medicare, pt has not been inpatient for 3 days yet. [facility] will follow up.  9:50am- CSW spoke with [facility], they can accept pt and will follow up about the wavier when they find out if they can do the waiver.  11:20- [facility] will allow waiver for medicare, CSW spoke with pt daughter and she stated she and her brother wants to transport pt themselves. CSW explained what PTAR was and that we offered ambulance transportation at DC at pt/family request. COVID is negative and pt is ready for DC, pt daughter will come back to the hospital to pick pt up at 2pm and transport to [facility].    Expected Discharge Plan: Skilled Nursing Facility Barriers to Discharge: SNF Pending bed offer  Expected Discharge Plan and Services Expected Discharge Plan: [place] In-house Referral: Clinical Social Work,Hospice / Palliative Care Discharge Planning Services: CM Consult Post Acute Care Choice: [place] Living arrangements for the past 2 months: Single Family Home                 DME Arranged: N/A DME Agency: NA         HH Agency: NA         Social Determinants of Health (SDOH) Interventions    Readmission Risk Interventions No flowsheet data found.

## 2020-10-17 DIAGNOSIS — I2510 Atherosclerotic heart disease of native coronary artery without angina pectoris: Secondary | ICD-10-CM | POA: Diagnosis not present

## 2020-10-17 DIAGNOSIS — J449 Chronic obstructive pulmonary disease, unspecified: Secondary | ICD-10-CM | POA: Diagnosis not present

## 2020-10-17 DIAGNOSIS — I10 Essential (primary) hypertension: Secondary | ICD-10-CM | POA: Diagnosis not present

## 2020-10-17 DIAGNOSIS — N179 Acute kidney failure, unspecified: Secondary | ICD-10-CM | POA: Diagnosis not present

## 2020-10-17 DIAGNOSIS — E860 Dehydration: Secondary | ICD-10-CM | POA: Diagnosis not present

## 2020-10-17 DIAGNOSIS — E441 Mild protein-calorie malnutrition: Secondary | ICD-10-CM | POA: Diagnosis not present

## 2020-10-18 LAB — CULTURE, BLOOD (ROUTINE X 2)
Culture: NO GROWTH
Culture: NO GROWTH

## 2020-10-19 ENCOUNTER — Other Ambulatory Visit: Payer: Self-pay | Admitting: *Deleted

## 2020-10-19 NOTE — Patient Outreach (Signed)
Member screened for potential [facility] Care Management needs. Per [place] (PatientPing) member resides in [facility].   Communication sent to facility SW to inquire about transition plans.   Will continue to follow while member resides in SNF.    Beverly Rolling, MSN, RN,BSN [place] [facility] Acute Care Coordinator [phone] [facility]) [phone]  (Toll free office)

## 2020-10-20 ENCOUNTER — Other Ambulatory Visit: Payer: Self-pay

## 2020-10-20 ENCOUNTER — Non-Acute Institutional Stay: Payer: Self-pay | Admitting: Hospice

## 2020-10-20 DIAGNOSIS — I10 Essential (primary) hypertension: Secondary | ICD-10-CM | POA: Diagnosis not present

## 2020-10-20 DIAGNOSIS — Z515 Encounter for palliative care: Secondary | ICD-10-CM | POA: Diagnosis not present

## 2020-10-20 DIAGNOSIS — E43 Unspecified severe protein-calorie malnutrition: Secondary | ICD-10-CM

## 2020-10-20 DIAGNOSIS — N179 Acute kidney failure, unspecified: Secondary | ICD-10-CM | POA: Diagnosis not present

## 2020-10-20 NOTE — Progress Notes (Signed)
  [facility] Palliative Care Consult Note Telephone: [phone]  Fax: [phone]  PATIENT NAME: Janayah M Murdock [address] [place] [address] [phone] (home)  DOB: 02/27/1927 MRN: [id number]  PRIMARY CARE PROVIDER:    Crawford, Elizabeth A, MD,  [address] [place] [address] [phone]  REFERRING PROVIDER:   Dr. Daniel Patterson RESPONSIBLE PARTY:   Daughter - Pugh -nurse Contact Information    Name Relation Home Work Mobile   Pugh,Marsha Daughter [phone]  [phone]   Aslinger,David Son [phone]         I met face to face with patient and family at SNF facility. Palliative Care was asked to follow this patient by consultation request of  Dr. Daniel Patterson to address advance care planning and complex medical decision making.  David is present with patient in the room during visit; he reports Pugh is at work.  This is the initial visit.    ASSESSMENT AND / RECOMMENDATIONS:   Advance Care Planning: Our advance care planning conversation included a discussion about:     The value and importance of advance care planning   Difference between Hospice and Palliative care  Exploration of goals of care in the event of a sudden injury or illness   Identification and preparation of a healthcare agent   Review and updating or creation of an  advance directive document ..  CODE STATUS: Patient is a DO NOT RESUSCITATE  Goals of Care: Goals include to maximize quality of life and symptom management  I spent 46 minutes providing this initial consultation. More than 50% of the time in this consultation was spent on counseling patient and coordinating communication. --------------------------------------------------------------------------------------------------------------------------------------  Symptom Management/Plan: Acute kidney injury: Continue adequate oral hydration.  Avoid nephrotoxic  medications as much as possible, weighing risk versus benefits of use.  Bactrim has been discontinued.  Plan is to follow-up with urologist, per David.  CBC BMP in 1 week. Hypertension: Managed with amlodipine. COPD: No recent COPD exacerbation.  Continue albuterol and Breo Ellipta breathing treatments as ordered. Weakness continue PT/OT for weakness coordination and unsteadiness on feet. Protein caloric malnutrition: Boost 3 times daily.  If no improvement consider Megace.  Follow up: Palliative care will continue to follow for complex medical decision making, advance care planning, and clarification of goals. Return 6 weeks or prn.Encouraged to call provider sooner with any concerns.   PPS: 50% HOSPICE ELIGIBILITY/DIAGNOSIS: TBD  Chief Complaint: Initial Palliative care visit:/Acute kidney injury  HISTORY OF PRESENT ILLNESS:  Rayne M Seth is a 87 y.o. year old female  with multiple medical conditions including including acute kidney injury, multifactorial - obstructing component of large renal stone and decreased p.o. intake.  Acute kidney injury with decreased mentation, sleeping more than usual, worsened with poor oral intake, impairing her activities of daily living. Hospitalization 5/3-10/16/2020 due to abnormal labs  acute kidney injury and hyperkalemia and urinary tract infection.  She received 3 days of ceftriaxone while in the hospital.  Renal function improved with IV hydration, mentation improving.  Hyperkalemia, resolved.  History of COPD, colon polyps hypertension, diverticulosis, recurrent urinary tract infection, new possible diagnosis of renal mass. History obtained from review of EMR, discussion with primary team, caregiver, family and/or Ms. Vallie.  Review and summarization of Epic records shows history from other than patient. Rest of 10 point ROS asked and negative.  Palliative Care was asked to follow this patient by consultation request of Dr. Daniel Patterson to help    address complex decision making in the context of advance care planning and goals of care clarification.    Review of lab tests/diagnostics   Recent Labs  Lab 10/14/20 0501 10/15/20 0311  WBC 11.2* 9.6  HGB 11.8* 11.8*  HCT 36.9 37.1  PLT 308 343  MCV 95.8 96.6   Recent Labs  Lab 10/14/20 0501 10/14/20 1433 10/15/20 0311 10/16/20 0450  NA 132*  --  137 137  K 6.0*   < > 4.5 3.8  CL 107  --  105 106  CO2 19*  --  24 25  BUN 69*  --  51* 36*  CREATININE 3.02*  --  2.09* 1.70*  GLUCOSE 115*  --  77 79   < > = values in this interval not displayed.   Latest GFR by Cockcroft Gault (not valid in AKI or ESRD) Estimated Creatinine Clearance: 17.8 mL/min (A) (by C-G formula based on SCr of 1.7 mg/dL (H)). Recent Labs  Lab 10/13/20 1942  AST 18  ALT 14  ALKPHOS 79   ROS General: NAD EYES: denies vision changes ENMT: denies dysphagia Cardiovascular: denies chest pain/discomfort Pulmonary: denies cough, denies SOB Abdomen: endorses good appetite, denies constipation, endorses continence of bowel GU: denies dysuria, urinary frequency MSK: Endorses weakness,  no falls reported Skin: denies rashes or wounds Neurological: denies pain, denies insomnia Psych: Endorses positive mood Heme/lymph/immuno: denies bruises, abnormal bleeding  Physical Exam: Height/Weight 5 feet 7 inches/120.2 Ibs BP 112/72 T 97.6 F R 18 02 96% Constitutional: NAD General: Well groomed EYES: anicteric sclera, lids intact, no discharge  ENMT: Moist mucous membrane CV: S1 S2, RRR, nonpitting edema bilateral lower extremities Pulmonary: LCTA, no increased work of breathing, no cough, Abdomen: active BS + 4 quadrants, soft and non tender GU: no suprapubic tenderness MSK: weakness, sarcopenia, limited ROM Skin: warm and dry, no rashes or wounds on visible skin Neuro:  weakness, otherwise non focal Psych: non-anxious affect Hem/lymph/immuno: no widespread bruising   PAST MEDICAL HISTORY:  Active  Ambulatory Problems    Diagnosis Date Noted  . Hyperlipidemia 05/29/2007  . Essential hypertension 05/29/2007  . Allergic rhinitis 05/29/2007  . COPD, mild (HCC) 11/21/2007  . Routine health maintenance 06/18/2011  . Chronic low back pain 06/18/2012  . Advanced care planning/counseling discussion 07/17/2013  . Urinary urgency 10/13/2020  . AKI (acute kidney injury) (HCC) 10/13/2020  . Hyperkalemia 10/13/2020  . Left kidney mass 10/13/2020  . Severe protein-calorie malnutrition (HCC) 10/13/2020  . Acute lower UTI 10/14/2020   Resolved Ambulatory Problems    Diagnosis Date Noted  . Hypopotassemia 06/12/2007  . CHRONIC OBSTRUCTIVE PULMONARY DISEASE, ACUTE EXACERBATION 11/21/2007  . REACTIVE AIRWAY DISEASE 05/29/2007  . DIVERTICULOSIS, COLON 11/21/2007  . RADICULOPATHY 05/29/2007  . COLONIC POLYPS, HX OF 11/21/2007  . HYSTERECTOMY, HX OF 05/29/2007  . Other acquired absence of organ 05/29/2007  . Other postprocedural status(V45.89) 05/29/2007  . HIP PAIN, LEFT, CHRONIC 06/01/2010  . Carpal tunnel syndrome of right wrist 07/26/2015  . Acute sinus infection 10/14/2015  . Left otitis externa 10/14/2015  . Dyspnea 02/02/2017  . COPD exacerbation (HCC) 02/02/2017  . Rash 08/19/2017  . Right hip pain 04/11/2019  . Fx sacrum/coccyx-closed (HCC) 05/03/2019  . Incontinence of feces 05/03/2019   Past Medical History:  Diagnosis Date  . ALLERGIC RHINITIS 05/29/2007  . COPD 11/21/2007  . HYPERLIPIDEMIA 05/29/2007  . HYPERTENSION 05/29/2007    SOCIAL HX:  Social History   Tobacco Use  . Smoking status: Former Smoker      Quit date: 06/14/1980    Years since quitting: 40.3  . Smokeless tobacco: Never Used  Substance Use Topics  . Alcohol use: No    Alcohol/week: 0.0 standard drinks    Family /Caregiver/Community Supports: Patient in SNF for ongoing care   FAMILY HX:  Family History  Problem Relation Age of Onset  . Arthritis Mother   . Heart disease Mother   . Uterine  cancer Daughter   . Cancer Daughter        ovarian  . Heart disease Father   . Diabetes Neg Hx   . COPD Neg Hx   . Stroke Neg Hx   . Kidney disease Neg Hx       ALLERGIES:  Allergies  Allergen Reactions  . Prednisone     Patient became jittery all over  . Codeine       PERTINENT MEDICATIONS:  Outpatient Encounter Medications as of 10/20/2020  Medication Sig  . acetaminophen (TYLENOL) 325 MG tablet Take 2 tablets (650 mg total) by mouth every 6 (six) hours as needed for mild pain (or Fever >/= 101).  Marland Kitchen amLODipine (NORVASC) 10 MG tablet Take 1 tablet (10 mg total) by mouth daily.  Marland Kitchen aspirin EC 81 MG tablet Take 81 mg by mouth daily. Swallow whole.  . feeding supplement (ENSURE ENLIVE / ENSURE PLUS) LIQD Take 237 mLs by mouth 3 (three) times daily between meals.  . fluticasone furoate-vilanterol (BREO ELLIPTA) 100-25 MCG/INH AEPB Inhale 1 puff into the lungs 2 (two) times daily.  . Multiple Vitamins-Minerals (CENTRUM SILVER) tablet Take 1 tablet by mouth daily.  Marland Kitchen PROAIR HFA 108 (90 Base) MCG/ACT inhaler INHALE 2 PUFFS INTO THE LUNGS EVERY 6 HOURS AS NEEDED FOR WHEEZING ORSHORTNESS OF BREATH (Patient taking differently: Inhale 2 puffs into the lungs every 6 (six) hours as needed for wheezing or shortness of breath.)  . RESTASIS 0.05 % ophthalmic emulsion Apply to eye 2 (two) times daily. (Patient not taking: No sig reported)   No facility-administered encounter medications on file as of 10/20/2020.     Thank you for the opportunity to participate in the care of Ms. Ruz.  The palliative care team will continue to follow. Please call our office at [phone] if we can be of additional assistance.   Note: Portions of this note were generated with Lobbyist. Dictation errors may occur despite best attempts at proofreading.  Teodoro Spray, NP

## 2020-10-21 ENCOUNTER — Other Ambulatory Visit: Payer: Self-pay | Admitting: *Deleted

## 2020-10-21 DIAGNOSIS — L988 Other specified disorders of the skin and subcutaneous tissue: Secondary | ICD-10-CM | POA: Diagnosis not present

## 2020-10-21 DIAGNOSIS — S81802A Unspecified open wound, left lower leg, initial encounter: Secondary | ICD-10-CM | POA: Diagnosis not present

## 2020-10-21 DIAGNOSIS — L89302 Pressure ulcer of unspecified buttock, stage 2: Secondary | ICD-10-CM | POA: Diagnosis not present

## 2020-10-21 NOTE — Patient Outreach (Signed)
[place] Coordinator follow up. Member screened for potential [facility] care coordination needs.  Update from [facility] SN[facility] SW indicating Mrs. Covello is being followed by palliative care. Care plan not scheduled yet.   Will continue to follow for potential care coordination needs while member resides in SNF.    Marthenia Rolling, MSN, RN,BSN [place] Acute Care Coordinator [phone] [facility]) [phone]  (Toll free office)

## 2020-10-27 ENCOUNTER — Emergency Department (HOSPITAL_COMMUNITY)
Admission: EM | Admit: 2020-10-27 | Discharge: 2020-10-27 | Disposition: A | Discharge status: Home / Self Care | Payer: Medicare Other | Attending: Emergency Medicine | Admitting: Emergency Medicine

## 2020-10-27 ENCOUNTER — Emergency Department (HOSPITAL_COMMUNITY): Payer: Medicare Other

## 2020-10-27 ENCOUNTER — Encounter (HOSPITAL_COMMUNITY): Payer: Self-pay

## 2020-10-27 DIAGNOSIS — I10 Essential (primary) hypertension: Secondary | ICD-10-CM | POA: Diagnosis not present

## 2020-10-27 DIAGNOSIS — I700 Atherosclerosis of aorta: Secondary | ICD-10-CM | POA: Diagnosis not present

## 2020-10-27 DIAGNOSIS — L89152 Pressure ulcer of sacral region, stage 2: Secondary | ICD-10-CM | POA: Diagnosis not present

## 2020-10-27 DIAGNOSIS — J441 Chronic obstructive pulmonary disease with (acute) exacerbation: Secondary | ICD-10-CM | POA: Insufficient documentation

## 2020-10-27 DIAGNOSIS — J90 Pleural effusion, not elsewhere classified: Secondary | ICD-10-CM | POA: Diagnosis not present

## 2020-10-27 DIAGNOSIS — Z87891 Personal history of nicotine dependence: Secondary | ICD-10-CM | POA: Insufficient documentation

## 2020-10-27 DIAGNOSIS — Z79899 Other long term (current) drug therapy: Secondary | ICD-10-CM | POA: Insufficient documentation

## 2020-10-27 DIAGNOSIS — Z7982 Long term (current) use of aspirin: Secondary | ICD-10-CM | POA: Diagnosis not present

## 2020-10-27 DIAGNOSIS — Z96642 Presence of left artificial hip joint: Secondary | ICD-10-CM | POA: Diagnosis not present

## 2020-10-27 DIAGNOSIS — Z7951 Long term (current) use of inhaled steroids: Secondary | ICD-10-CM | POA: Diagnosis not present

## 2020-10-27 DIAGNOSIS — R4182 Altered mental status, unspecified: Secondary | ICD-10-CM | POA: Insufficient documentation

## 2020-10-27 LAB — COMPREHENSIVE METABOLIC PANEL
ALT: 19 U/L (ref 0–44)
AST: 21 U/L (ref 15–41)
Albumin: 2.6 g/dL — ABNORMAL LOW (ref 3.5–5.0)
Alkaline Phosphatase: 73 U/L (ref 38–126)
Anion gap: 5 (ref 5–15)
BUN: 38 mg/dL — ABNORMAL HIGH (ref 8–23)
CO2: 26 mmol/L (ref 22–32)
Calcium: 8.9 mg/dL (ref 8.9–10.3)
Chloride: 110 mmol/L (ref 98–111)
Creatinine, Ser: 1.55 mg/dL — ABNORMAL HIGH (ref 0.44–1.00)
GFR, Estimated: 31 mL/min — ABNORMAL LOW (ref >60)
Glucose, Bld: 88 mg/dL (ref 70–99)
Potassium: 4.4 mmol/L (ref 3.5–5.1)
Sodium: 141 mmol/L (ref 135–145)
Total Bilirubin: 0.5 mg/dL (ref 0.3–1.2)
Total Protein: 6.4 g/dL — ABNORMAL LOW (ref 6.5–8.1)

## 2020-10-27 LAB — CBC WITH DIFFERENTIAL/PLATELET
Abs Immature Granulocytes: 0.02 10*3/uL (ref 0.00–0.07)
Basophils Absolute: 0.1 10*3/uL (ref 0.0–0.1)
Basophils Relative: 1 %
Eosinophils Absolute: 0.2 10*3/uL (ref 0.0–0.5)
Eosinophils Relative: 4 %
HCT: 35.1 % — ABNORMAL LOW (ref 36.0–46.0)
Hemoglobin: 10.8 g/dL — ABNORMAL LOW (ref 12.0–15.0)
Immature Granulocytes: 0 %
Lymphocytes Relative: 30 %
Lymphs Abs: 2 10*3/uL (ref 0.7–4.0)
MCH: 30.5 pg (ref 26.0–34.0)
MCHC: 30.8 g/dL (ref 30.0–36.0)
MCV: 99.2 fL (ref 80.0–100.0)
Monocytes Absolute: 0.7 10*3/uL (ref 0.1–1.0)
Monocytes Relative: 10 %
Neutro Abs: 3.7 10*3/uL (ref 1.7–7.7)
Neutrophils Relative %: 55 %
Platelets: 332 10*3/uL (ref 150–400)
RBC: 3.54 MIL/uL — ABNORMAL LOW (ref 3.87–5.11)
RDW: 15 % (ref 11.5–15.5)
WBC: 6.7 10*3/uL (ref 4.0–10.5)
nRBC: 0 % (ref 0.0–0.2)

## 2020-10-27 LAB — PROTIME-INR
INR: 1.1 (ref 0.8–1.2)
Prothrombin Time: 14.1 seconds (ref 11.4–15.2)

## 2020-10-27 LAB — APTT: aPTT: 28 seconds (ref 24–36)

## 2020-10-27 LAB — LACTIC ACID, PLASMA: Lactic Acid, Venous: 1.2 mmol/L (ref 0.5–1.9)

## 2020-10-27 MED ORDER — LACTATED RINGERS IV BOLUS (SEPSIS)
1000.0000 mL | Freq: Once | INTRAVENOUS | Status: AC
  Administered 2020-10-27: 1000 mL via INTRAVENOUS

## 2020-10-27 NOTE — ED Triage Notes (Signed)
Amended note - family  States pt was at baseline at breakfast but declined after that.

## 2020-10-27 NOTE — ED Notes (Signed)
Pts daughter states that she along with her brother will transport the patient home. Daughter states she knows that there are 14 people in line ahead of her mom with PTAR and I would prefer to get her home and situated myself. I just need assistance with getting her into a wheelchair and my brother will help me with the rest." this nurse made the primary nurse aware of pts family request.

## 2020-10-27 NOTE — ED Notes (Signed)
PURE WICK IN PLACE WILL GIVE URINE SAMPLE WHEN ABLE.

## 2020-10-27 NOTE — ED Provider Notes (Signed)
[facility] DEPT Provider Note   CSN: [id number] Arrival date & time: 10/27/20  1356     History Chief Complaint  Patient presents with  . Altered Mental Status    [name] is a 87 y.o. female.   Altered Mental Status Presenting symptoms: behavior changes and partial responsiveness   Severity:  Moderate Most recent episode:  Today Timing:  Constant Progression:  Unchanged Chronicity:  New Context comment:  Unclear cause, recently treated for elevated WBC with doxycyline and AKI with elevated K      Past Medical History:  Diagnosis Date  . ALLERGIC RHINITIS 05/29/2007  . CHRONIC OBSTRUCTIVE PULMONARY DISEASE, ACUTE EXACERBATION 11/21/2007  . COLONIC POLYPS, HX OF 11/21/2007  . COPD 11/21/2007  . DIVERTICULOSIS, COLON 11/21/2007  . HIP PAIN, LEFT, CHRONIC 06/01/2010  . HYPERLIPIDEMIA 05/29/2007  . HYPERTENSION 05/29/2007  . Hypopotassemia 06/12/2007  . HYSTERECTOMY, HX OF 05/29/2007  . Other acquired absence of organ 05/29/2007  . Other postprocedural status(V45.89) 05/29/2007  . RADICULOPATHY 05/29/2007  . REACTIVE AIRWAY DISEASE 05/29/2007    Patient Active Problem List   Diagnosis Date Noted  . Acute lower UTI 10/14/2020  . Urinary urgency 10/13/2020  . AKI (acute kidney injury) ([place]) 10/13/2020  . Hyperkalemia 10/13/2020  . Left kidney mass 10/13/2020  . Severe protein-calorie malnutrition ([place]) 10/13/2020  . Advanced care planning/counseling discussion 07/17/2013  . Chronic low back pain 06/18/2012  . Routine health maintenance 06/18/2011  . COPD, mild ([place]) 11/21/2007  . Hyperlipidemia 05/29/2007  . Essential hypertension 05/29/2007  . Allergic rhinitis 05/29/2007    Past Surgical History:  Procedure Laterality Date  . ABDOMINAL HYSTERECTOMY    . APPENDECTOMY    . CATARACT EXTRACTION Bilateral 2016   Dr. Katy Fitch  . CATARACT EXTRACTION W/ INTRAOCULAR LENS IMPLANT Bilateral 05/2015   around december 9th and 28    . DILATION AND CURETTAGE OF UTERUS    . LIPOMA EXCISION    . LUMBAR LAMINECTOMY    . PARTIAL HIP ARTHROPLASTY Left 2012  . TONSILLECTOMY       OB History   No obstetric history on file.     Family History  Problem Relation Age of Onset  . Arthritis Mother   . Heart disease Mother   . Uterine cancer Daughter   . Cancer Daughter        ovarian  . Heart disease Father   . Diabetes Neg Hx   . COPD Neg Hx   . Stroke Neg Hx   . Kidney disease Neg Hx     Social History   Tobacco Use  . Smoking status: Former Smoker    Quit date: 06/14/1980    Years since quitting: 40.3  . Smokeless tobacco: Never Used  Vaping Use  . Vaping Use: Never used  Substance Use Topics  . Alcohol use: No    Alcohol/week: 0.0 standard drinks  . Drug use: No    Home Medications Prior to Admission medications   Medication Sig Start Date End Date Taking? Authorizing Provider  acetaminophen (TYLENOL) 325 MG tablet Take 2 tablets (650 mg total) by mouth every 6 (six) hours as needed for mild pain (or Fever >/= 101). 10/16/20   Lorella Nimrod, MD  amLODipine (NORVASC) 10 MG tablet Take 1 tablet (10 mg total) by mouth daily. 10/28/19   Hoyt Koch, MD  aspirin EC 81 MG tablet Take 81 mg by mouth daily. Swallow whole.    [provider]  feeding  supplement (ENSURE ENLIVE / ENSURE PLUS) LIQD Take 237 mLs by mouth 3 (three) times daily between meals. 10/16/20   Lorella Nimrod, MD  fluticasone furoate-vilanterol (BREO ELLIPTA) 100-25 MCG/INH AEPB Inhale 1 puff into the lungs 2 (two) times daily. 10/28/19   Hoyt Koch, MD  Multiple Vitamins-Minerals (CENTRUM SILVER) tablet Take 1 tablet by mouth daily.    [provider]  PROAIR HFA 108 (90 Base) MCG/ACT inhaler INHALE 2 PUFFS INTO THE LUNGS EVERY 6 HOURS AS NEEDED FOR WHEEZING ORSHORTNESS OF BREATH Patient taking differently: Inhale 2 puffs into the lungs every 6 (six) hours as needed for wheezing or shortness of breath. 10/28/19    Hoyt Koch, MD  RESTASIS 0.05 % ophthalmic emulsion Apply to eye 2 (two) times daily. Patient not taking: No sig reported 03/14/18   [provider]    Allergies    Prednisone and Codeine  Review of Systems   Review of Systems  Unable to perform ROS: Acuity of condition    Physical Exam Updated Vital Signs BP 103/75   Pulse 89   Temp 98.4 F (36.9 C) (Oral)   Resp (!) 26   Ht 5\' 4"  (1.626 m)   Wt 54.9 kg   SpO2 95%   BMI 20.77 kg/m   Physical Exam Vitals and nursing note reviewed. Exam conducted with a chaperone present.  Constitutional:      General: She is not in acute distress.    Appearance: Normal appearance.  HENT:     Head: Normocephalic and atraumatic.     Nose: No rhinorrhea.  Eyes:     General:        Right eye: No discharge.        Left eye: No discharge.     Conjunctiva/sclera: Conjunctivae normal.  Cardiovascular:     Rate and Rhythm: Normal rate and regular rhythm.  Pulmonary:     Effort: Pulmonary effort is normal. No respiratory distress.     Breath sounds: No stridor.  Abdominal:     General: Abdomen is flat. There is no distension.     Palpations: Abdomen is soft.  Musculoskeletal:        General: No tenderness or signs of injury.     Comments: Stage I and stage II sacral ulcers, mild tenderness to palpation no exposed bone no bleeding, no induration no fluctuant masses  Skin:    General: Skin is warm and dry.  Neurological:     Mental Status: She is alert.     Comments: Patient follows commands, patient moves all 4 extremities equally, patient nods head yes to sensation intact throughout.  Patient has no signs of facial droop is only speaking in one-word sentences.  Patient is opening eyes to command.  Looking each direction to command.  Psychiatric:        Mood and Affect: Mood normal.        Behavior: Behavior normal.     ED Results / Procedures / Treatments   Labs (all labs ordered are listed, but only abnormal  results are displayed) Labs Reviewed  CULTURE, BLOOD (SINGLE)  URINE CULTURE  LACTIC ACID, PLASMA  LACTIC ACID, PLASMA  COMPREHENSIVE METABOLIC PANEL  CBC WITH DIFFERENTIAL/PLATELET  PROTIME-INR  APTT  URINALYSIS, ROUTINE W REFLEX MICROSCOPIC    EKG EKG Interpretation  Date/Time:  Tuesday Oct 27 2020 14:28:45 EDT Ventricular Rate:  94 PR Interval:  184 QRS Duration: 141 QT Interval:  410 QTC Calculation: 513 R Axis:   -  62 Text Interpretation: Sinus rhythm Left bundle branch block Confirmed by Dewaine Conger [phone]) on 10/27/2020 2:31:03 PM   Radiology No results found.  Procedures Procedures   Medications Ordered in ED Medications  lactated ringers bolus 1,000 mL (1,000 mLs Intravenous New Bag/Given 10/27/20 1444)    ED Course  I have reviewed the triage vital signs and the nursing notes.  Pertinent labs & imaging results that were available during my care of the patient were reviewed by me and considered in my medical decision making (see chart for details).    MDM Rules/Calculators/A&P                          Altered mental status of unknown cause.  Was acting more normally this morning before breakfast but slowly decline, partially responsive to verbal stimuli.  Laying still with eyes closed.  Has some mild tachypnea heart rate above 90.  Recent white count of 14 with unknown source on doxycycline, will investigate for infectious etiology we will also do CT scan.  No blood thinners listed on home medication regimen.  Need to contact family for more information.  Patient is DNR.  I had a chance to speak with this patient's family.  She states that she was her normal health earlier today.  And then she took a nap and was difficult to arouse.  Now that the patient is here she is much more alert and per the family is 61 to 90% back to normal.  Is a given her current status they would want her to go back to the nursing facility soon as possible.  They are happy for [place] to do  evaluation for metabolic or infectious derangements or bleeding, so given her current work-up I feel as though we have enough data to collect for now.  I do not feel we need to gather more as there are no significant cannot reports from family and the patient is overall doing well.  She is back to her baseline essentially.  Family says she has been dealing with memory issues and does not have a formal diagnosis of dementia however they feel she does have dementia.  They feel that if this was a transient ischemic attack they would not want aggressive work-up as they would not likely put her on blood thinners as she is a high fall risk.  They would not likely want to change a lot of her medications and that they want to just keep her comfortable and focus on quality of life at the nursing facility.  We will get our screening evaluation we will get her CT scan of the head will reassess need for admission for easily corrected pathology versus discharge home.  Pt care was handed off to on coming provider at 1515.  Complete history and physical and current plan have been communicated.  Please refer to their note for the remainder of ED care and ultimate disposition.  Pt seen in conjunction with Dr. Zenia Resides   Final Clinical Impression(s) / ED Diagnoses Final diagnoses:  Altered mental status, unspecified altered mental status type    Rx / DC Orders ED Discharge Orders    None       Breck Coons, MD 10/27/20 1456

## 2020-10-27 NOTE — ED Provider Notes (Signed)
Patient signed to me by Dr. Ron Parker pending completion of work-up.  Patient's family member states that she had a normal urinalysis yesterday.  Request that she be transported back to facility without the urinalysis here.   Lacretia Leigh, MD 10/27/20 1836

## 2020-10-27 NOTE — ED Triage Notes (Signed)
Pt BIB [facility] from [facility], family state pt not at baseline at breakfast, facility states this has been a slow decline over a few days. Hx cognitive decline. Recently had kidney injury and hyperkalemia. WBC 14.1 on 5/13, started doxycycline last night. No neuro deficits, no recent injuries. Alert to voice, answering questions appropriately. Pt has only complained of increased need for urination. Periodic known a-fib, HR 80-100, known LBBB.  BP 110s/70s HR 80-100 RR 28 SpO2 100% EtCo2 28 CBG 108 afebrile

## 2020-10-28 DIAGNOSIS — L03116 Cellulitis of left lower limb: Secondary | ICD-10-CM | POA: Diagnosis not present

## 2020-10-28 DIAGNOSIS — L988 Other specified disorders of the skin and subcutaneous tissue: Secondary | ICD-10-CM | POA: Diagnosis not present

## 2020-10-28 DIAGNOSIS — D72829 Elevated white blood cell count, unspecified: Secondary | ICD-10-CM | POA: Diagnosis not present

## 2020-10-28 DIAGNOSIS — R2243 Localized swelling, mass and lump, lower limb, bilateral: Secondary | ICD-10-CM | POA: Diagnosis not present

## 2020-10-28 DIAGNOSIS — L89302 Pressure ulcer of unspecified buttock, stage 2: Secondary | ICD-10-CM | POA: Diagnosis not present

## 2020-10-28 DIAGNOSIS — R112 Nausea with vomiting, unspecified: Secondary | ICD-10-CM | POA: Diagnosis not present

## 2020-10-28 DIAGNOSIS — I10 Essential (primary) hypertension: Secondary | ICD-10-CM | POA: Diagnosis not present

## 2020-10-28 DIAGNOSIS — D49512 Neoplasm of unspecified behavior of left kidney: Secondary | ICD-10-CM | POA: Diagnosis not present

## 2020-11-01 LAB — CULTURE, BLOOD (SINGLE)
Culture: NO GROWTH
Special Requests: ADEQUATE

## 2020-11-04 DIAGNOSIS — L89302 Pressure ulcer of unspecified buttock, stage 2: Secondary | ICD-10-CM | POA: Diagnosis not present

## 2020-11-04 DIAGNOSIS — S81802A Unspecified open wound, left lower leg, initial encounter: Secondary | ICD-10-CM | POA: Diagnosis not present

## 2020-11-04 DIAGNOSIS — L988 Other specified disorders of the skin and subcutaneous tissue: Secondary | ICD-10-CM | POA: Diagnosis not present

## 2020-11-08 DIAGNOSIS — E46 Unspecified protein-calorie malnutrition: Secondary | ICD-10-CM | POA: Diagnosis not present

## 2020-11-08 DIAGNOSIS — R2243 Localized swelling, mass and lump, lower limb, bilateral: Secondary | ICD-10-CM | POA: Diagnosis not present

## 2020-11-08 DIAGNOSIS — L03116 Cellulitis of left lower limb: Secondary | ICD-10-CM | POA: Diagnosis not present

## 2020-11-12 ENCOUNTER — Telehealth: Payer: Self-pay | Admitting: Internal Medicine

## 2020-11-12 DIAGNOSIS — M6281 Muscle weakness (generalized): Secondary | ICD-10-CM | POA: Diagnosis not present

## 2020-11-12 DIAGNOSIS — R279 Unspecified lack of coordination: Secondary | ICD-10-CM | POA: Diagnosis not present

## 2020-11-12 DIAGNOSIS — J449 Chronic obstructive pulmonary disease, unspecified: Secondary | ICD-10-CM | POA: Diagnosis not present

## 2020-11-12 DIAGNOSIS — H9193 Unspecified hearing loss, bilateral: Secondary | ICD-10-CM | POA: Diagnosis not present

## 2020-11-12 DIAGNOSIS — R41841 Cognitive communication deficit: Secondary | ICD-10-CM | POA: Diagnosis not present

## 2020-11-12 DIAGNOSIS — E785 Hyperlipidemia, unspecified: Secondary | ICD-10-CM | POA: Diagnosis not present

## 2020-11-12 DIAGNOSIS — N200 Calculus of kidney: Secondary | ICD-10-CM | POA: Diagnosis not present

## 2020-11-12 DIAGNOSIS — R278 Other lack of coordination: Secondary | ICD-10-CM | POA: Diagnosis not present

## 2020-11-12 DIAGNOSIS — I2510 Atherosclerotic heart disease of native coronary artery without angina pectoris: Secondary | ICD-10-CM | POA: Diagnosis not present

## 2020-11-12 DIAGNOSIS — Z87440 Personal history of urinary (tract) infections: Secondary | ICD-10-CM | POA: Diagnosis not present

## 2020-11-12 DIAGNOSIS — N2889 Other specified disorders of kidney and ureter: Secondary | ICD-10-CM | POA: Diagnosis not present

## 2020-11-12 DIAGNOSIS — Z79899 Other long term (current) drug therapy: Secondary | ICD-10-CM | POA: Diagnosis not present

## 2020-11-12 DIAGNOSIS — Z7982 Long term (current) use of aspirin: Secondary | ICD-10-CM | POA: Diagnosis not present

## 2020-11-12 DIAGNOSIS — Z96642 Presence of left artificial hip joint: Secondary | ICD-10-CM | POA: Diagnosis not present

## 2020-11-12 DIAGNOSIS — E43 Unspecified severe protein-calorie malnutrition: Secondary | ICD-10-CM | POA: Diagnosis not present

## 2020-11-12 DIAGNOSIS — I10 Essential (primary) hypertension: Secondary | ICD-10-CM | POA: Diagnosis not present

## 2020-11-12 DIAGNOSIS — J309 Allergic rhinitis, unspecified: Secondary | ICD-10-CM | POA: Diagnosis not present

## 2020-11-12 DIAGNOSIS — N390 Urinary tract infection, site not specified: Secondary | ICD-10-CM | POA: Diagnosis not present

## 2020-11-12 DIAGNOSIS — R2681 Unsteadiness on feet: Secondary | ICD-10-CM | POA: Diagnosis not present

## 2020-11-12 DIAGNOSIS — K5730 Diverticulosis of large intestine without perforation or abscess without bleeding: Secondary | ICD-10-CM | POA: Diagnosis not present

## 2020-11-12 NOTE — Telephone Encounter (Signed)
See below

## 2020-11-12 NOTE — Telephone Encounter (Signed)
[name] PT with [facility] [phone]  Verbal Orders: Continue PT twice a week for 3 weeks  Then once a week for the remainder of the 60 days   Also, patient had a fall at the nursing home and has wounds on both legs. The wounds have dressing on them but they want to make sure it is okay for a nurse to evaluate the wounds. Please advise

## 2020-11-13 NOTE — Telephone Encounter (Signed)
   Daleen Snook made aware okay for verbal orders

## 2020-11-13 NOTE — Telephone Encounter (Signed)
Okay for verbals and okay to evaluate the wounds.

## 2020-11-17 ENCOUNTER — Telehealth: Payer: Self-pay | Admitting: Internal Medicine

## 2020-11-17 DIAGNOSIS — E43 Unspecified severe protein-calorie malnutrition: Secondary | ICD-10-CM | POA: Diagnosis not present

## 2020-11-17 DIAGNOSIS — N200 Calculus of kidney: Secondary | ICD-10-CM | POA: Diagnosis not present

## 2020-11-17 DIAGNOSIS — N390 Urinary tract infection, site not specified: Secondary | ICD-10-CM | POA: Diagnosis not present

## 2020-11-17 DIAGNOSIS — J449 Chronic obstructive pulmonary disease, unspecified: Secondary | ICD-10-CM | POA: Diagnosis not present

## 2020-11-17 DIAGNOSIS — E785 Hyperlipidemia, unspecified: Secondary | ICD-10-CM | POA: Diagnosis not present

## 2020-11-17 DIAGNOSIS — I2510 Atherosclerotic heart disease of native coronary artery without angina pectoris: Secondary | ICD-10-CM | POA: Diagnosis not present

## 2020-11-17 NOTE — Telephone Encounter (Signed)
Fine

## 2020-11-17 NOTE — Telephone Encounter (Signed)
   [name] from [facility] states patients daughter declined wound care services, states she is a Marine scientist a will attend to wound  [name] also reported patient has decreased appetite  Requesting daughter [name] called to  discuss

## 2020-11-17 NOTE — Telephone Encounter (Signed)
See below

## 2020-11-19 ENCOUNTER — Telehealth: Payer: Self-pay | Admitting: Internal Medicine

## 2020-11-19 ENCOUNTER — Encounter: Payer: Self-pay | Admitting: Internal Medicine

## 2020-11-19 NOTE — Telephone Encounter (Signed)
LVM for pt to rtn my call to schedule AWV with [facility]. Please schedule this appt if pt calls the office.  °

## 2020-11-20 NOTE — Telephone Encounter (Signed)
I would like to defer this to Dr C for Monday, as this is somewhat complicated, may need an OV and Dr C knows the patient best.

## 2020-11-24 ENCOUNTER — Telehealth: Payer: Self-pay | Admitting: Internal Medicine

## 2020-11-24 NOTE — Telephone Encounter (Signed)
   Manus Gunning from [place] calling to confirm Massie Kluver agrees with hospice  Phone [phone]

## 2020-11-25 ENCOUNTER — Telehealth: Payer: Self-pay | Admitting: *Deleted

## 2020-11-25 NOTE — Telephone Encounter (Signed)
Manus Gunning notified of PCP response; PCP will remain attending. Manus Gunning confirms that she will get it set up for pt.

## 2020-11-25 NOTE — Telephone Encounter (Signed)
Yes, appropriate.

## 2020-11-25 NOTE — Telephone Encounter (Signed)
Faxed signed plan of care to [facility]. Confirmation rec'd OK.

## 2020-11-26 NOTE — Telephone Encounter (Signed)
Can you call and find out what pharmacy and then either put in and re-route to me or sign the remeron which is pended to pharmacy desired? Thanks

## 2020-11-27 DIAGNOSIS — Z87440 Personal history of urinary (tract) infections: Secondary | ICD-10-CM | POA: Diagnosis not present

## 2020-11-27 DIAGNOSIS — E785 Hyperlipidemia, unspecified: Secondary | ICD-10-CM | POA: Diagnosis not present

## 2020-11-27 DIAGNOSIS — I10 Essential (primary) hypertension: Secondary | ICD-10-CM | POA: Diagnosis not present

## 2020-11-27 DIAGNOSIS — J449 Chronic obstructive pulmonary disease, unspecified: Secondary | ICD-10-CM | POA: Diagnosis not present

## 2020-11-27 DIAGNOSIS — N2889 Other specified disorders of kidney and ureter: Secondary | ICD-10-CM | POA: Diagnosis not present

## 2020-11-27 DIAGNOSIS — N200 Calculus of kidney: Secondary | ICD-10-CM | POA: Diagnosis not present

## 2020-11-27 DIAGNOSIS — F0280 Dementia in other diseases classified elsewhere without behavioral disturbance: Secondary | ICD-10-CM | POA: Diagnosis not present

## 2020-11-27 DIAGNOSIS — Z87891 Personal history of nicotine dependence: Secondary | ICD-10-CM | POA: Diagnosis not present

## 2020-11-27 DIAGNOSIS — E43 Unspecified severe protein-calorie malnutrition: Secondary | ICD-10-CM | POA: Diagnosis not present

## 2020-11-27 DIAGNOSIS — G311 Senile degeneration of brain, not elsewhere classified: Secondary | ICD-10-CM | POA: Diagnosis not present

## 2020-11-28 DIAGNOSIS — Z87891 Personal history of nicotine dependence: Secondary | ICD-10-CM | POA: Diagnosis not present

## 2020-11-28 DIAGNOSIS — G311 Senile degeneration of brain, not elsewhere classified: Secondary | ICD-10-CM | POA: Diagnosis not present

## 2020-11-28 DIAGNOSIS — E43 Unspecified severe protein-calorie malnutrition: Secondary | ICD-10-CM | POA: Diagnosis not present

## 2020-11-28 DIAGNOSIS — J449 Chronic obstructive pulmonary disease, unspecified: Secondary | ICD-10-CM | POA: Diagnosis not present

## 2020-11-28 DIAGNOSIS — F0280 Dementia in other diseases classified elsewhere without behavioral disturbance: Secondary | ICD-10-CM | POA: Diagnosis not present

## 2020-11-28 DIAGNOSIS — I10 Essential (primary) hypertension: Secondary | ICD-10-CM | POA: Diagnosis not present

## 2020-11-30 DIAGNOSIS — F0280 Dementia in other diseases classified elsewhere without behavioral disturbance: Secondary | ICD-10-CM | POA: Diagnosis not present

## 2020-11-30 DIAGNOSIS — G311 Senile degeneration of brain, not elsewhere classified: Secondary | ICD-10-CM | POA: Diagnosis not present

## 2020-11-30 DIAGNOSIS — J449 Chronic obstructive pulmonary disease, unspecified: Secondary | ICD-10-CM | POA: Diagnosis not present

## 2020-11-30 DIAGNOSIS — E43 Unspecified severe protein-calorie malnutrition: Secondary | ICD-10-CM | POA: Diagnosis not present

## 2020-11-30 DIAGNOSIS — I10 Essential (primary) hypertension: Secondary | ICD-10-CM | POA: Diagnosis not present

## 2020-11-30 DIAGNOSIS — Z87891 Personal history of nicotine dependence: Secondary | ICD-10-CM | POA: Diagnosis not present

## 2020-12-11 DIAGNOSIS — N2889 Other specified disorders of kidney and ureter: Secondary | ICD-10-CM | POA: Diagnosis not present

## 2020-12-11 DIAGNOSIS — E785 Hyperlipidemia, unspecified: Secondary | ICD-10-CM | POA: Diagnosis not present

## 2020-12-11 DIAGNOSIS — F0280 Dementia in other diseases classified elsewhere without behavioral disturbance: Secondary | ICD-10-CM | POA: Diagnosis not present

## 2020-12-11 DIAGNOSIS — I10 Essential (primary) hypertension: Secondary | ICD-10-CM | POA: Diagnosis not present

## 2020-12-11 DIAGNOSIS — G311 Senile degeneration of brain, not elsewhere classified: Secondary | ICD-10-CM | POA: Diagnosis not present

## 2020-12-11 DIAGNOSIS — Z87891 Personal history of nicotine dependence: Secondary | ICD-10-CM | POA: Diagnosis not present

## 2020-12-11 DIAGNOSIS — Z87440 Personal history of urinary (tract) infections: Secondary | ICD-10-CM | POA: Diagnosis not present

## 2020-12-11 DIAGNOSIS — J449 Chronic obstructive pulmonary disease, unspecified: Secondary | ICD-10-CM | POA: Diagnosis not present

## 2020-12-11 DIAGNOSIS — N200 Calculus of kidney: Secondary | ICD-10-CM | POA: Diagnosis not present

## 2020-12-11 DIAGNOSIS — E43 Unspecified severe protein-calorie malnutrition: Secondary | ICD-10-CM | POA: Diagnosis not present

## 2020-12-15 NOTE — Telephone Encounter (Signed)
Angela Nevin from [facility] called   They did not receive the fax for the plan of care or the [place].   Please refax to [phone].

## 2020-12-16 NOTE — Telephone Encounter (Signed)
Daughter states that Hospice has taken over obtaining medications for patient & patient no longer needs PCP to refill this medication.

## 2020-12-18 DIAGNOSIS — G311 Senile degeneration of brain, not elsewhere classified: Secondary | ICD-10-CM | POA: Diagnosis not present

## 2020-12-18 DIAGNOSIS — E43 Unspecified severe protein-calorie malnutrition: Secondary | ICD-10-CM | POA: Diagnosis not present

## 2020-12-18 DIAGNOSIS — F0280 Dementia in other diseases classified elsewhere without behavioral disturbance: Secondary | ICD-10-CM | POA: Diagnosis not present

## 2020-12-18 DIAGNOSIS — J449 Chronic obstructive pulmonary disease, unspecified: Secondary | ICD-10-CM | POA: Diagnosis not present

## 2020-12-18 DIAGNOSIS — I10 Essential (primary) hypertension: Secondary | ICD-10-CM | POA: Diagnosis not present

## 2020-12-18 DIAGNOSIS — Z87891 Personal history of nicotine dependence: Secondary | ICD-10-CM | POA: Diagnosis not present

## 2020-12-22 DIAGNOSIS — J449 Chronic obstructive pulmonary disease, unspecified: Secondary | ICD-10-CM | POA: Diagnosis not present

## 2020-12-22 DIAGNOSIS — I10 Essential (primary) hypertension: Secondary | ICD-10-CM | POA: Diagnosis not present

## 2020-12-22 DIAGNOSIS — G311 Senile degeneration of brain, not elsewhere classified: Secondary | ICD-10-CM | POA: Diagnosis not present

## 2020-12-22 DIAGNOSIS — E43 Unspecified severe protein-calorie malnutrition: Secondary | ICD-10-CM | POA: Diagnosis not present

## 2020-12-22 DIAGNOSIS — F0280 Dementia in other diseases classified elsewhere without behavioral disturbance: Secondary | ICD-10-CM | POA: Diagnosis not present

## 2020-12-22 DIAGNOSIS — Z87891 Personal history of nicotine dependence: Secondary | ICD-10-CM | POA: Diagnosis not present

## 2020-12-25 ENCOUNTER — Telehealth: Payer: Self-pay | Admitting: Internal Medicine

## 2020-12-25 NOTE — Telephone Encounter (Signed)
   Angela Nevin w/ [facility] said that she faced plan of care and CTI twice and is requesting that Dr. Sharlet Salina sign them and have them faxed back   Phone: [phone]- ask for Angela Nevin  Fax: [phone]

## 2020-12-25 NOTE — Telephone Encounter (Signed)
Noted  

## 2020-12-28 DIAGNOSIS — I10 Essential (primary) hypertension: Secondary | ICD-10-CM | POA: Diagnosis not present

## 2020-12-28 DIAGNOSIS — J449 Chronic obstructive pulmonary disease, unspecified: Secondary | ICD-10-CM | POA: Diagnosis not present

## 2020-12-28 DIAGNOSIS — E43 Unspecified severe protein-calorie malnutrition: Secondary | ICD-10-CM | POA: Diagnosis not present

## 2020-12-28 DIAGNOSIS — Z87891 Personal history of nicotine dependence: Secondary | ICD-10-CM | POA: Diagnosis not present

## 2020-12-28 DIAGNOSIS — F0280 Dementia in other diseases classified elsewhere without behavioral disturbance: Secondary | ICD-10-CM | POA: Diagnosis not present

## 2020-12-28 DIAGNOSIS — G311 Senile degeneration of brain, not elsewhere classified: Secondary | ICD-10-CM | POA: Diagnosis not present

## 2020-12-29 DIAGNOSIS — I10 Essential (primary) hypertension: Secondary | ICD-10-CM | POA: Diagnosis not present

## 2020-12-29 DIAGNOSIS — E43 Unspecified severe protein-calorie malnutrition: Secondary | ICD-10-CM | POA: Diagnosis not present

## 2020-12-29 DIAGNOSIS — Z87891 Personal history of nicotine dependence: Secondary | ICD-10-CM | POA: Diagnosis not present

## 2020-12-29 DIAGNOSIS — J449 Chronic obstructive pulmonary disease, unspecified: Secondary | ICD-10-CM | POA: Diagnosis not present

## 2020-12-29 DIAGNOSIS — F0280 Dementia in other diseases classified elsewhere without behavioral disturbance: Secondary | ICD-10-CM | POA: Diagnosis not present

## 2020-12-29 DIAGNOSIS — G311 Senile degeneration of brain, not elsewhere classified: Secondary | ICD-10-CM | POA: Diagnosis not present

## 2020-12-30 DIAGNOSIS — E43 Unspecified severe protein-calorie malnutrition: Secondary | ICD-10-CM | POA: Diagnosis not present

## 2020-12-30 DIAGNOSIS — I10 Essential (primary) hypertension: Secondary | ICD-10-CM | POA: Diagnosis not present

## 2020-12-30 DIAGNOSIS — F0280 Dementia in other diseases classified elsewhere without behavioral disturbance: Secondary | ICD-10-CM | POA: Diagnosis not present

## 2020-12-30 DIAGNOSIS — J449 Chronic obstructive pulmonary disease, unspecified: Secondary | ICD-10-CM | POA: Diagnosis not present

## 2020-12-30 DIAGNOSIS — Z87891 Personal history of nicotine dependence: Secondary | ICD-10-CM | POA: Diagnosis not present

## 2020-12-30 DIAGNOSIS — G311 Senile degeneration of brain, not elsewhere classified: Secondary | ICD-10-CM | POA: Diagnosis not present

## 2021-01-11 DEATH — deceased

## 2021-09-30 ENCOUNTER — Telehealth: Payer: Self-pay | Admitting: Internal Medicine

## 2021-09-30 NOTE — Telephone Encounter (Signed)
Left message for patient to call back to schedule Medicare Annual Wellness Visit  ? ?Last AWV  10/28/19 ? ?Please schedule at anytime with [facility] if patient calls the office back.   ? ? ? ?Any questions, please call me at [phone]  ?

## 2023-05-13 IMAGING — DX DG CHEST 1V PORT
1 series · 1 of 1 positions shown · non-contrast
Comparison: February 02, 2017

CLINICAL DATA: Altered mental status.

EXAM:
PORTABLE CHEST 1 VIEW

[chest ap]
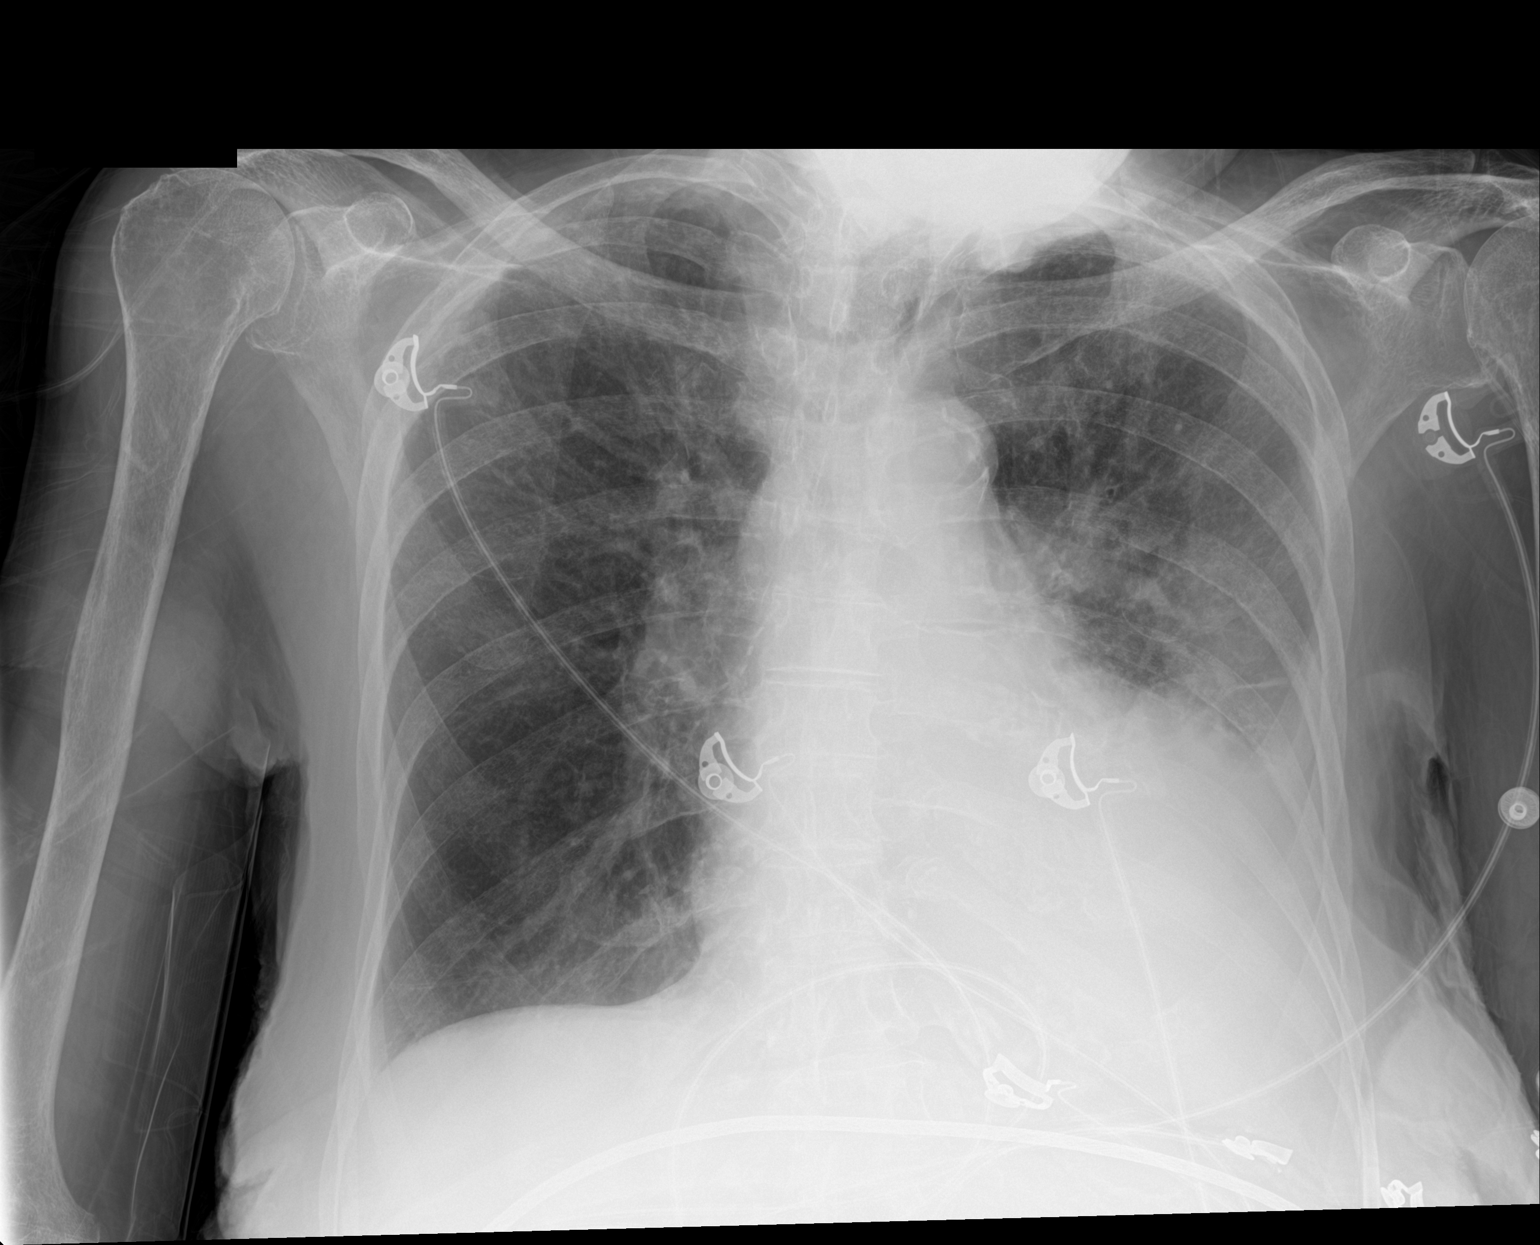

[1 of 1 positions shown; findings below may reference images not displayed]

FINDINGS: Marked severity left basilar atelectasis and/or infiltrate is seen.
A small left pleural effusion is noted. No pneumothorax is
identified. The cardiac silhouette is borderline in size. Moderate
severity calcification of the aortic arch is seen. The visualized
skeletal structures are unremarkable.
IMPRESSION: 1. Marked severity left basilar atelectasis and/or infiltrate.
2. Small left pleural effusion.
